# Patient Record
Sex: Female | Born: 1960
Health system: Southern US, Community
[De-identification: ages and names within clinical notes are randomized; demographics above are authoritative.]

## PROBLEM LIST (undated history)

## (undated) DIAGNOSIS — K529 Noninfective gastroenteritis and colitis, unspecified: Secondary | ICD-10-CM

## (undated) DIAGNOSIS — K219 Gastro-esophageal reflux disease without esophagitis: Secondary | ICD-10-CM

## (undated) DIAGNOSIS — C50919 Malignant neoplasm of unspecified site of unspecified female breast: Secondary | ICD-10-CM

## (undated) HISTORY — PX: WRIST SURGERY: SHX841

## (undated) HISTORY — PX: TUBAL LIGATION: SHX77

## (undated) HISTORY — PX: HERNIA REPAIR: SHX51

## (undated) HISTORY — PX: MASTECTOMY: SHX3

## (undated) HISTORY — DX: Noninfective gastroenteritis and colitis, unspecified: K52.9

## (undated) HISTORY — DX: Malignant neoplasm of unspecified site of unspecified female breast: C50.919

## (undated) HISTORY — PX: CHOLECYSTECTOMY: SHX55

## (undated) HISTORY — PX: COLON SURGERY: SHX602

## (undated) HISTORY — DX: Gastro-esophageal reflux disease without esophagitis: K21.9

## (undated) HISTORY — PX: TONSILLECTOMY: SUR1361

## (undated) HISTORY — PX: APPENDECTOMY: SHX54

## (undated) HISTORY — PX: OTHER SURGICAL HISTORY: SHX169

## (undated) MED FILL — Dexamethasone Sodium Phosphate Inj 100 MG/10ML: INTRAMUSCULAR | Qty: 1 | Status: AC

## (undated) MED FILL — Famotidine in NaCl 0.9% IV Soln 20 MG/50ML: INTRAVENOUS | Qty: 100 | Status: AC

## (undated) MED FILL — Fosaprepitant Dimeglumine For IV Infusion 150 MG (Base Eq): INTRAVENOUS | Qty: 5 | Status: AC

---

## 2015-07-29 DIAGNOSIS — C50911 Malignant neoplasm of unspecified site of right female breast: Secondary | ICD-10-CM | POA: Diagnosis not present

## 2015-07-29 DIAGNOSIS — C773 Secondary and unspecified malignant neoplasm of axilla and upper limb lymph nodes: Secondary | ICD-10-CM | POA: Diagnosis not present

## 2015-09-29 DIAGNOSIS — E669 Obesity, unspecified: Secondary | ICD-10-CM | POA: Insufficient documentation

## 2015-09-29 DIAGNOSIS — Z6830 Body mass index (BMI) 30.0-30.9, adult: Secondary | ICD-10-CM | POA: Insufficient documentation

## 2015-09-29 DIAGNOSIS — C773 Secondary and unspecified malignant neoplasm of axilla and upper limb lymph nodes: Secondary | ICD-10-CM | POA: Insufficient documentation

## 2015-09-29 DIAGNOSIS — C50111 Malignant neoplasm of central portion of right female breast: Secondary | ICD-10-CM | POA: Insufficient documentation

## 2015-12-02 DIAGNOSIS — C773 Secondary and unspecified malignant neoplasm of axilla and upper limb lymph nodes: Secondary | ICD-10-CM | POA: Diagnosis not present

## 2015-12-02 DIAGNOSIS — Z79811 Long term (current) use of aromatase inhibitors: Secondary | ICD-10-CM | POA: Diagnosis not present

## 2015-12-02 DIAGNOSIS — C50911 Malignant neoplasm of unspecified site of right female breast: Secondary | ICD-10-CM | POA: Diagnosis not present

## 2015-12-02 DIAGNOSIS — Z9011 Acquired absence of right breast and nipple: Secondary | ICD-10-CM | POA: Diagnosis not present

## 2016-04-02 DIAGNOSIS — C50111 Malignant neoplasm of central portion of right female breast: Secondary | ICD-10-CM | POA: Diagnosis not present

## 2016-04-02 DIAGNOSIS — Z170 Estrogen receptor positive status [ER+]: Secondary | ICD-10-CM | POA: Diagnosis not present

## 2016-04-02 DIAGNOSIS — Z79811 Long term (current) use of aromatase inhibitors: Secondary | ICD-10-CM | POA: Diagnosis not present

## 2016-04-20 DIAGNOSIS — Z452 Encounter for adjustment and management of vascular access device: Secondary | ICD-10-CM | POA: Insufficient documentation

## 2016-05-04 DIAGNOSIS — Z09 Encounter for follow-up examination after completed treatment for conditions other than malignant neoplasm: Secondary | ICD-10-CM | POA: Insufficient documentation

## 2016-08-03 DIAGNOSIS — Z79811 Long term (current) use of aromatase inhibitors: Secondary | ICD-10-CM | POA: Diagnosis not present

## 2016-08-03 DIAGNOSIS — Z170 Estrogen receptor positive status [ER+]: Secondary | ICD-10-CM | POA: Diagnosis not present

## 2016-08-03 DIAGNOSIS — C773 Secondary and unspecified malignant neoplasm of axilla and upper limb lymph nodes: Secondary | ICD-10-CM | POA: Diagnosis not present

## 2016-08-03 DIAGNOSIS — C50911 Malignant neoplasm of unspecified site of right female breast: Secondary | ICD-10-CM | POA: Diagnosis not present

## 2016-08-03 DIAGNOSIS — Z9011 Acquired absence of right breast and nipple: Secondary | ICD-10-CM | POA: Diagnosis not present

## 2016-08-03 DIAGNOSIS — Z853 Personal history of malignant neoplasm of breast: Secondary | ICD-10-CM | POA: Diagnosis not present

## 2016-09-09 DIAGNOSIS — R1013 Epigastric pain: Secondary | ICD-10-CM | POA: Diagnosis not present

## 2016-10-01 DIAGNOSIS — Z1231 Encounter for screening mammogram for malignant neoplasm of breast: Secondary | ICD-10-CM | POA: Diagnosis not present

## 2016-10-19 DIAGNOSIS — C773 Secondary and unspecified malignant neoplasm of axilla and upper limb lymph nodes: Secondary | ICD-10-CM | POA: Diagnosis not present

## 2016-10-19 DIAGNOSIS — Z170 Estrogen receptor positive status [ER+]: Secondary | ICD-10-CM | POA: Diagnosis not present

## 2016-10-19 DIAGNOSIS — C50111 Malignant neoplasm of central portion of right female breast: Secondary | ICD-10-CM | POA: Diagnosis not present

## 2016-11-04 DIAGNOSIS — C50919 Malignant neoplasm of unspecified site of unspecified female breast: Secondary | ICD-10-CM | POA: Diagnosis not present

## 2016-11-04 DIAGNOSIS — Z1211 Encounter for screening for malignant neoplasm of colon: Secondary | ICD-10-CM | POA: Diagnosis not present

## 2016-11-04 DIAGNOSIS — K621 Rectal polyp: Secondary | ICD-10-CM | POA: Diagnosis not present

## 2016-11-04 DIAGNOSIS — C50911 Malignant neoplasm of unspecified site of right female breast: Secondary | ICD-10-CM | POA: Diagnosis not present

## 2016-11-04 DIAGNOSIS — D126 Benign neoplasm of colon, unspecified: Secondary | ICD-10-CM | POA: Diagnosis not present

## 2016-11-04 DIAGNOSIS — C785 Secondary malignant neoplasm of large intestine and rectum: Secondary | ICD-10-CM | POA: Diagnosis not present

## 2016-11-12 DIAGNOSIS — C50911 Malignant neoplasm of unspecified site of right female breast: Secondary | ICD-10-CM | POA: Diagnosis not present

## 2016-11-12 DIAGNOSIS — C785 Secondary malignant neoplasm of large intestine and rectum: Secondary | ICD-10-CM | POA: Diagnosis not present

## 2016-11-12 DIAGNOSIS — R634 Abnormal weight loss: Secondary | ICD-10-CM | POA: Diagnosis not present

## 2016-11-12 DIAGNOSIS — C773 Secondary and unspecified malignant neoplasm of axilla and upper limb lymph nodes: Secondary | ICD-10-CM | POA: Diagnosis not present

## 2016-11-12 DIAGNOSIS — C50111 Malignant neoplasm of central portion of right female breast: Secondary | ICD-10-CM | POA: Diagnosis not present

## 2016-11-15 DIAGNOSIS — R109 Unspecified abdominal pain: Secondary | ICD-10-CM | POA: Diagnosis not present

## 2016-11-16 DIAGNOSIS — C50111 Malignant neoplasm of central portion of right female breast: Secondary | ICD-10-CM | POA: Diagnosis not present

## 2016-11-16 DIAGNOSIS — C785 Secondary malignant neoplasm of large intestine and rectum: Secondary | ICD-10-CM | POA: Diagnosis not present

## 2016-11-16 DIAGNOSIS — N1330 Unspecified hydronephrosis: Secondary | ICD-10-CM | POA: Diagnosis not present

## 2016-11-16 DIAGNOSIS — R188 Other ascites: Secondary | ICD-10-CM | POA: Diagnosis not present

## 2016-11-16 DIAGNOSIS — C7889 Secondary malignant neoplasm of other digestive organs: Secondary | ICD-10-CM | POA: Diagnosis not present

## 2016-11-16 DIAGNOSIS — C50919 Malignant neoplasm of unspecified site of unspecified female breast: Secondary | ICD-10-CM | POA: Diagnosis not present

## 2016-11-16 DIAGNOSIS — C784 Secondary malignant neoplasm of small intestine: Secondary | ICD-10-CM | POA: Diagnosis not present

## 2016-11-16 DIAGNOSIS — K5669 Other partial intestinal obstruction: Secondary | ICD-10-CM | POA: Diagnosis not present

## 2016-12-01 DIAGNOSIS — C50919 Malignant neoplasm of unspecified site of unspecified female breast: Secondary | ICD-10-CM | POA: Diagnosis not present

## 2016-12-01 DIAGNOSIS — C785 Secondary malignant neoplasm of large intestine and rectum: Secondary | ICD-10-CM | POA: Diagnosis not present

## 2016-12-01 DIAGNOSIS — Z5111 Encounter for antineoplastic chemotherapy: Secondary | ICD-10-CM | POA: Diagnosis not present

## 2016-12-01 DIAGNOSIS — C50111 Malignant neoplasm of central portion of right female breast: Secondary | ICD-10-CM | POA: Diagnosis not present

## 2016-12-05 DIAGNOSIS — K5669 Other partial intestinal obstruction: Secondary | ICD-10-CM | POA: Diagnosis not present

## 2016-12-05 DIAGNOSIS — K56609 Unspecified intestinal obstruction, unspecified as to partial versus complete obstruction: Secondary | ICD-10-CM | POA: Diagnosis not present

## 2016-12-05 DIAGNOSIS — R112 Nausea with vomiting, unspecified: Secondary | ICD-10-CM | POA: Diagnosis not present

## 2016-12-05 DIAGNOSIS — R188 Other ascites: Secondary | ICD-10-CM | POA: Diagnosis not present

## 2016-12-05 DIAGNOSIS — R1110 Vomiting, unspecified: Secondary | ICD-10-CM | POA: Diagnosis not present

## 2016-12-05 DIAGNOSIS — R109 Unspecified abdominal pain: Secondary | ICD-10-CM | POA: Diagnosis not present

## 2016-12-05 DIAGNOSIS — K56600 Partial intestinal obstruction, unspecified as to cause: Secondary | ICD-10-CM | POA: Diagnosis not present

## 2016-12-05 DIAGNOSIS — N390 Urinary tract infection, site not specified: Secondary | ICD-10-CM | POA: Diagnosis not present

## 2016-12-10 DIAGNOSIS — R188 Other ascites: Secondary | ICD-10-CM | POA: Diagnosis not present

## 2016-12-15 DIAGNOSIS — C50111 Malignant neoplasm of central portion of right female breast: Secondary | ICD-10-CM | POA: Diagnosis not present

## 2016-12-15 DIAGNOSIS — Z5111 Encounter for antineoplastic chemotherapy: Secondary | ICD-10-CM | POA: Diagnosis not present

## 2016-12-29 DIAGNOSIS — R109 Unspecified abdominal pain: Secondary | ICD-10-CM

## 2016-12-29 DIAGNOSIS — C50911 Malignant neoplasm of unspecified site of right female breast: Secondary | ICD-10-CM | POA: Diagnosis not present

## 2016-12-29 DIAGNOSIS — I951 Orthostatic hypotension: Secondary | ICD-10-CM | POA: Diagnosis not present

## 2016-12-29 DIAGNOSIS — C50919 Malignant neoplasm of unspecified site of unspecified female breast: Secondary | ICD-10-CM | POA: Diagnosis not present

## 2016-12-29 DIAGNOSIS — C785 Secondary malignant neoplasm of large intestine and rectum: Secondary | ICD-10-CM | POA: Diagnosis not present

## 2017-01-04 DIAGNOSIS — C773 Secondary and unspecified malignant neoplasm of axilla and upper limb lymph nodes: Secondary | ICD-10-CM | POA: Diagnosis not present

## 2017-01-04 DIAGNOSIS — C50111 Malignant neoplasm of central portion of right female breast: Secondary | ICD-10-CM | POA: Diagnosis not present

## 2017-01-04 DIAGNOSIS — Z515 Encounter for palliative care: Secondary | ICD-10-CM | POA: Diagnosis not present

## 2017-01-07 DIAGNOSIS — C50111 Malignant neoplasm of central portion of right female breast: Secondary | ICD-10-CM | POA: Diagnosis not present

## 2017-01-26 DIAGNOSIS — C50111 Malignant neoplasm of central portion of right female breast: Secondary | ICD-10-CM | POA: Diagnosis not present

## 2017-01-26 DIAGNOSIS — Z5111 Encounter for antineoplastic chemotherapy: Secondary | ICD-10-CM | POA: Diagnosis not present

## 2017-02-08 DIAGNOSIS — C50111 Malignant neoplasm of central portion of right female breast: Secondary | ICD-10-CM | POA: Diagnosis not present

## 2017-02-08 DIAGNOSIS — R188 Other ascites: Secondary | ICD-10-CM | POA: Diagnosis not present

## 2017-02-09 DIAGNOSIS — C773 Secondary and unspecified malignant neoplasm of axilla and upper limb lymph nodes: Secondary | ICD-10-CM | POA: Diagnosis not present

## 2017-02-09 DIAGNOSIS — R197 Diarrhea, unspecified: Secondary | ICD-10-CM | POA: Diagnosis not present

## 2017-02-09 DIAGNOSIS — C785 Secondary malignant neoplasm of large intestine and rectum: Secondary | ICD-10-CM | POA: Diagnosis not present

## 2017-02-09 DIAGNOSIS — C50919 Malignant neoplasm of unspecified site of unspecified female breast: Secondary | ICD-10-CM | POA: Diagnosis not present

## 2017-02-09 DIAGNOSIS — C50911 Malignant neoplasm of unspecified site of right female breast: Secondary | ICD-10-CM | POA: Diagnosis not present

## 2017-03-23 DIAGNOSIS — C785 Secondary malignant neoplasm of large intestine and rectum: Secondary | ICD-10-CM | POA: Diagnosis not present

## 2017-03-23 DIAGNOSIS — R197 Diarrhea, unspecified: Secondary | ICD-10-CM | POA: Diagnosis not present

## 2017-03-23 DIAGNOSIS — C50911 Malignant neoplasm of unspecified site of right female breast: Secondary | ICD-10-CM | POA: Diagnosis not present

## 2017-03-23 DIAGNOSIS — Z515 Encounter for palliative care: Secondary | ICD-10-CM | POA: Diagnosis not present

## 2017-03-23 DIAGNOSIS — C50919 Malignant neoplasm of unspecified site of unspecified female breast: Secondary | ICD-10-CM | POA: Diagnosis not present

## 2017-03-24 DIAGNOSIS — C785 Secondary malignant neoplasm of large intestine and rectum: Secondary | ICD-10-CM | POA: Diagnosis not present

## 2017-03-24 DIAGNOSIS — C50911 Malignant neoplasm of unspecified site of right female breast: Secondary | ICD-10-CM | POA: Diagnosis not present

## 2017-03-24 DIAGNOSIS — Z515 Encounter for palliative care: Secondary | ICD-10-CM | POA: Diagnosis not present

## 2017-03-25 DIAGNOSIS — C785 Secondary malignant neoplasm of large intestine and rectum: Secondary | ICD-10-CM | POA: Diagnosis not present

## 2017-03-25 DIAGNOSIS — C50911 Malignant neoplasm of unspecified site of right female breast: Secondary | ICD-10-CM | POA: Diagnosis not present

## 2017-04-25 DIAGNOSIS — C785 Secondary malignant neoplasm of large intestine and rectum: Secondary | ICD-10-CM

## 2017-04-25 DIAGNOSIS — Z515 Encounter for palliative care: Secondary | ICD-10-CM | POA: Diagnosis not present

## 2017-04-25 DIAGNOSIS — C50919 Malignant neoplasm of unspecified site of unspecified female breast: Secondary | ICD-10-CM

## 2017-04-25 DIAGNOSIS — C50911 Malignant neoplasm of unspecified site of right female breast: Secondary | ICD-10-CM | POA: Diagnosis not present

## 2017-05-25 DIAGNOSIS — C50111 Malignant neoplasm of central portion of right female breast: Secondary | ICD-10-CM | POA: Diagnosis not present

## 2017-05-25 DIAGNOSIS — R188 Other ascites: Secondary | ICD-10-CM | POA: Diagnosis not present

## 2017-05-25 DIAGNOSIS — I700 Atherosclerosis of aorta: Secondary | ICD-10-CM | POA: Diagnosis not present

## 2017-05-26 DIAGNOSIS — Z515 Encounter for palliative care: Secondary | ICD-10-CM | POA: Diagnosis not present

## 2017-05-26 DIAGNOSIS — C785 Secondary malignant neoplasm of large intestine and rectum: Secondary | ICD-10-CM | POA: Diagnosis not present

## 2017-05-26 DIAGNOSIS — C50919 Malignant neoplasm of unspecified site of unspecified female breast: Secondary | ICD-10-CM

## 2017-05-26 DIAGNOSIS — C50911 Malignant neoplasm of unspecified site of right female breast: Secondary | ICD-10-CM | POA: Diagnosis not present

## 2017-06-27 DIAGNOSIS — C785 Secondary malignant neoplasm of large intestine and rectum: Secondary | ICD-10-CM | POA: Diagnosis not present

## 2017-06-27 DIAGNOSIS — C50911 Malignant neoplasm of unspecified site of right female breast: Secondary | ICD-10-CM | POA: Diagnosis not present

## 2017-06-27 DIAGNOSIS — C50919 Malignant neoplasm of unspecified site of unspecified female breast: Secondary | ICD-10-CM | POA: Diagnosis not present

## 2017-06-27 DIAGNOSIS — K521 Toxic gastroenteritis and colitis: Secondary | ICD-10-CM | POA: Diagnosis not present

## 2017-07-28 DIAGNOSIS — C785 Secondary malignant neoplasm of large intestine and rectum: Secondary | ICD-10-CM | POA: Diagnosis not present

## 2017-07-28 DIAGNOSIS — M7989 Other specified soft tissue disorders: Secondary | ICD-10-CM | POA: Diagnosis not present

## 2017-07-28 DIAGNOSIS — C50919 Malignant neoplasm of unspecified site of unspecified female breast: Secondary | ICD-10-CM | POA: Diagnosis not present

## 2017-07-28 DIAGNOSIS — C50111 Malignant neoplasm of central portion of right female breast: Secondary | ICD-10-CM | POA: Diagnosis not present

## 2017-08-24 DIAGNOSIS — C50111 Malignant neoplasm of central portion of right female breast: Secondary | ICD-10-CM | POA: Diagnosis not present

## 2017-08-25 DIAGNOSIS — C50919 Malignant neoplasm of unspecified site of unspecified female breast: Secondary | ICD-10-CM | POA: Diagnosis not present

## 2017-08-25 DIAGNOSIS — C773 Secondary and unspecified malignant neoplasm of axilla and upper limb lymph nodes: Secondary | ICD-10-CM | POA: Diagnosis not present

## 2017-08-25 DIAGNOSIS — C50111 Malignant neoplasm of central portion of right female breast: Secondary | ICD-10-CM | POA: Diagnosis not present

## 2017-08-25 DIAGNOSIS — K521 Toxic gastroenteritis and colitis: Secondary | ICD-10-CM | POA: Diagnosis not present

## 2017-08-25 DIAGNOSIS — C785 Secondary malignant neoplasm of large intestine and rectum: Secondary | ICD-10-CM | POA: Diagnosis not present

## 2017-09-08 DIAGNOSIS — C50111 Malignant neoplasm of central portion of right female breast: Secondary | ICD-10-CM | POA: Diagnosis not present

## 2017-09-13 DIAGNOSIS — C50111 Malignant neoplasm of central portion of right female breast: Secondary | ICD-10-CM | POA: Diagnosis not present

## 2017-09-23 DIAGNOSIS — C50111 Malignant neoplasm of central portion of right female breast: Secondary | ICD-10-CM | POA: Diagnosis not present

## 2017-09-23 DIAGNOSIS — C773 Secondary and unspecified malignant neoplasm of axilla and upper limb lymph nodes: Secondary | ICD-10-CM | POA: Diagnosis not present

## 2017-09-23 DIAGNOSIS — C50919 Malignant neoplasm of unspecified site of unspecified female breast: Secondary | ICD-10-CM | POA: Diagnosis not present

## 2017-09-23 DIAGNOSIS — C785 Secondary malignant neoplasm of large intestine and rectum: Secondary | ICD-10-CM | POA: Diagnosis not present

## 2017-10-06 DIAGNOSIS — C50111 Malignant neoplasm of central portion of right female breast: Secondary | ICD-10-CM | POA: Diagnosis not present

## 2017-11-03 DIAGNOSIS — C50111 Malignant neoplasm of central portion of right female breast: Secondary | ICD-10-CM | POA: Diagnosis not present

## 2017-11-03 DIAGNOSIS — C773 Secondary and unspecified malignant neoplasm of axilla and upper limb lymph nodes: Secondary | ICD-10-CM | POA: Diagnosis not present

## 2017-11-03 DIAGNOSIS — C785 Secondary malignant neoplasm of large intestine and rectum: Secondary | ICD-10-CM | POA: Diagnosis not present

## 2017-11-03 DIAGNOSIS — C50919 Malignant neoplasm of unspecified site of unspecified female breast: Secondary | ICD-10-CM | POA: Diagnosis not present

## 2017-12-01 DIAGNOSIS — C773 Secondary and unspecified malignant neoplasm of axilla and upper limb lymph nodes: Secondary | ICD-10-CM | POA: Diagnosis not present

## 2017-12-01 DIAGNOSIS — C785 Secondary malignant neoplasm of large intestine and rectum: Secondary | ICD-10-CM

## 2017-12-01 DIAGNOSIS — C50111 Malignant neoplasm of central portion of right female breast: Secondary | ICD-10-CM | POA: Diagnosis not present

## 2017-12-01 DIAGNOSIS — C50919 Malignant neoplasm of unspecified site of unspecified female breast: Secondary | ICD-10-CM

## 2017-12-28 DIAGNOSIS — C50111 Malignant neoplasm of central portion of right female breast: Secondary | ICD-10-CM | POA: Diagnosis not present

## 2017-12-29 DIAGNOSIS — K521 Toxic gastroenteritis and colitis: Secondary | ICD-10-CM

## 2017-12-29 DIAGNOSIS — C50919 Malignant neoplasm of unspecified site of unspecified female breast: Secondary | ICD-10-CM

## 2017-12-29 DIAGNOSIS — C50111 Malignant neoplasm of central portion of right female breast: Secondary | ICD-10-CM | POA: Diagnosis not present

## 2017-12-29 DIAGNOSIS — C785 Secondary malignant neoplasm of large intestine and rectum: Secondary | ICD-10-CM

## 2018-01-26 DIAGNOSIS — C50919 Malignant neoplasm of unspecified site of unspecified female breast: Secondary | ICD-10-CM | POA: Diagnosis not present

## 2018-01-26 DIAGNOSIS — C785 Secondary malignant neoplasm of large intestine and rectum: Secondary | ICD-10-CM | POA: Diagnosis not present

## 2018-01-26 DIAGNOSIS — K521 Toxic gastroenteritis and colitis: Secondary | ICD-10-CM | POA: Diagnosis not present

## 2018-01-26 DIAGNOSIS — C50111 Malignant neoplasm of central portion of right female breast: Secondary | ICD-10-CM | POA: Diagnosis not present

## 2018-02-23 DIAGNOSIS — K521 Toxic gastroenteritis and colitis: Secondary | ICD-10-CM | POA: Diagnosis not present

## 2018-02-23 DIAGNOSIS — C773 Secondary and unspecified malignant neoplasm of axilla and upper limb lymph nodes: Secondary | ICD-10-CM | POA: Diagnosis not present

## 2018-02-23 DIAGNOSIS — C50111 Malignant neoplasm of central portion of right female breast: Secondary | ICD-10-CM | POA: Diagnosis not present

## 2018-02-23 DIAGNOSIS — C785 Secondary malignant neoplasm of large intestine and rectum: Secondary | ICD-10-CM | POA: Diagnosis not present

## 2018-02-23 DIAGNOSIS — C50919 Malignant neoplasm of unspecified site of unspecified female breast: Secondary | ICD-10-CM | POA: Diagnosis not present

## 2018-03-23 DIAGNOSIS — Z170 Estrogen receptor positive status [ER+]: Secondary | ICD-10-CM | POA: Diagnosis not present

## 2018-03-23 DIAGNOSIS — C773 Secondary and unspecified malignant neoplasm of axilla and upper limb lymph nodes: Secondary | ICD-10-CM | POA: Diagnosis not present

## 2018-03-23 DIAGNOSIS — C50919 Malignant neoplasm of unspecified site of unspecified female breast: Secondary | ICD-10-CM

## 2018-03-23 DIAGNOSIS — C50111 Malignant neoplasm of central portion of right female breast: Secondary | ICD-10-CM | POA: Diagnosis not present

## 2018-03-23 DIAGNOSIS — K521 Toxic gastroenteritis and colitis: Secondary | ICD-10-CM

## 2018-03-23 DIAGNOSIS — C785 Secondary malignant neoplasm of large intestine and rectum: Secondary | ICD-10-CM

## 2018-04-19 DIAGNOSIS — C50111 Malignant neoplasm of central portion of right female breast: Secondary | ICD-10-CM | POA: Diagnosis not present

## 2018-04-19 DIAGNOSIS — C189 Malignant neoplasm of colon, unspecified: Secondary | ICD-10-CM | POA: Diagnosis not present

## 2018-04-20 DIAGNOSIS — C50111 Malignant neoplasm of central portion of right female breast: Secondary | ICD-10-CM | POA: Diagnosis not present

## 2018-04-20 DIAGNOSIS — Z23 Encounter for immunization: Secondary | ICD-10-CM | POA: Diagnosis not present

## 2018-04-20 DIAGNOSIS — C785 Secondary malignant neoplasm of large intestine and rectum: Secondary | ICD-10-CM

## 2018-04-20 DIAGNOSIS — Z9221 Personal history of antineoplastic chemotherapy: Secondary | ICD-10-CM

## 2018-04-20 DIAGNOSIS — C50919 Malignant neoplasm of unspecified site of unspecified female breast: Secondary | ICD-10-CM | POA: Diagnosis not present

## 2018-04-20 DIAGNOSIS — R197 Diarrhea, unspecified: Secondary | ICD-10-CM

## 2018-04-20 DIAGNOSIS — Z170 Estrogen receptor positive status [ER+]: Secondary | ICD-10-CM | POA: Diagnosis not present

## 2018-04-21 DIAGNOSIS — C773 Secondary and unspecified malignant neoplasm of axilla and upper limb lymph nodes: Secondary | ICD-10-CM | POA: Diagnosis not present

## 2018-04-21 DIAGNOSIS — C785 Secondary malignant neoplasm of large intestine and rectum: Secondary | ICD-10-CM | POA: Diagnosis not present

## 2018-04-21 DIAGNOSIS — C50111 Malignant neoplasm of central portion of right female breast: Secondary | ICD-10-CM | POA: Diagnosis not present

## 2018-05-08 DIAGNOSIS — K591 Functional diarrhea: Secondary | ICD-10-CM | POA: Insufficient documentation

## 2018-05-08 DIAGNOSIS — C785 Secondary malignant neoplasm of large intestine and rectum: Secondary | ICD-10-CM | POA: Insufficient documentation

## 2018-05-22 DIAGNOSIS — R197 Diarrhea, unspecified: Secondary | ICD-10-CM

## 2018-05-22 DIAGNOSIS — C785 Secondary malignant neoplasm of large intestine and rectum: Secondary | ICD-10-CM

## 2018-05-22 DIAGNOSIS — C50919 Malignant neoplasm of unspecified site of unspecified female breast: Secondary | ICD-10-CM

## 2018-05-22 DIAGNOSIS — C50111 Malignant neoplasm of central portion of right female breast: Secondary | ICD-10-CM | POA: Diagnosis not present

## 2018-05-22 DIAGNOSIS — Z9221 Personal history of antineoplastic chemotherapy: Secondary | ICD-10-CM

## 2018-05-22 DIAGNOSIS — Z170 Estrogen receptor positive status [ER+]: Secondary | ICD-10-CM

## 2018-06-01 DIAGNOSIS — R197 Diarrhea, unspecified: Secondary | ICD-10-CM | POA: Diagnosis not present

## 2018-06-01 DIAGNOSIS — K591 Functional diarrhea: Secondary | ICD-10-CM | POA: Diagnosis not present

## 2018-06-01 DIAGNOSIS — Z853 Personal history of malignant neoplasm of breast: Secondary | ICD-10-CM | POA: Diagnosis not present

## 2018-06-01 DIAGNOSIS — Z85038 Personal history of other malignant neoplasm of large intestine: Secondary | ICD-10-CM | POA: Diagnosis not present

## 2018-06-01 DIAGNOSIS — C785 Secondary malignant neoplasm of large intestine and rectum: Secondary | ICD-10-CM | POA: Diagnosis not present

## 2018-06-22 DIAGNOSIS — C785 Secondary malignant neoplasm of large intestine and rectum: Secondary | ICD-10-CM | POA: Diagnosis not present

## 2018-06-22 DIAGNOSIS — C773 Secondary and unspecified malignant neoplasm of axilla and upper limb lymph nodes: Secondary | ICD-10-CM | POA: Diagnosis not present

## 2018-06-22 DIAGNOSIS — C50111 Malignant neoplasm of central portion of right female breast: Secondary | ICD-10-CM | POA: Diagnosis not present

## 2018-06-26 DIAGNOSIS — C785 Secondary malignant neoplasm of large intestine and rectum: Secondary | ICD-10-CM | POA: Diagnosis not present

## 2018-06-26 DIAGNOSIS — C773 Secondary and unspecified malignant neoplasm of axilla and upper limb lymph nodes: Secondary | ICD-10-CM | POA: Diagnosis not present

## 2018-06-26 DIAGNOSIS — C50111 Malignant neoplasm of central portion of right female breast: Secondary | ICD-10-CM | POA: Diagnosis not present

## 2018-07-21 DIAGNOSIS — Z5111 Encounter for antineoplastic chemotherapy: Secondary | ICD-10-CM | POA: Diagnosis not present

## 2018-07-21 DIAGNOSIS — Z170 Estrogen receptor positive status [ER+]: Secondary | ICD-10-CM | POA: Diagnosis not present

## 2018-07-21 DIAGNOSIS — C50919 Malignant neoplasm of unspecified site of unspecified female breast: Secondary | ICD-10-CM | POA: Diagnosis not present

## 2018-07-21 DIAGNOSIS — C785 Secondary malignant neoplasm of large intestine and rectum: Secondary | ICD-10-CM | POA: Diagnosis not present

## 2018-07-21 DIAGNOSIS — C50111 Malignant neoplasm of central portion of right female breast: Secondary | ICD-10-CM | POA: Diagnosis not present

## 2018-08-18 DIAGNOSIS — C785 Secondary malignant neoplasm of large intestine and rectum: Secondary | ICD-10-CM | POA: Diagnosis not present

## 2018-08-18 DIAGNOSIS — C773 Secondary and unspecified malignant neoplasm of axilla and upper limb lymph nodes: Secondary | ICD-10-CM | POA: Diagnosis not present

## 2018-08-18 DIAGNOSIS — Z170 Estrogen receptor positive status [ER+]: Secondary | ICD-10-CM | POA: Diagnosis not present

## 2018-08-18 DIAGNOSIS — C50919 Malignant neoplasm of unspecified site of unspecified female breast: Secondary | ICD-10-CM | POA: Diagnosis not present

## 2018-08-18 DIAGNOSIS — C50111 Malignant neoplasm of central portion of right female breast: Secondary | ICD-10-CM | POA: Diagnosis not present

## 2018-08-22 DIAGNOSIS — N1339 Other hydronephrosis: Secondary | ICD-10-CM | POA: Diagnosis not present

## 2018-08-22 DIAGNOSIS — Z853 Personal history of malignant neoplasm of breast: Secondary | ICD-10-CM | POA: Diagnosis not present

## 2018-08-22 DIAGNOSIS — C50111 Malignant neoplasm of central portion of right female breast: Secondary | ICD-10-CM | POA: Diagnosis not present

## 2018-09-15 DIAGNOSIS — C785 Secondary malignant neoplasm of large intestine and rectum: Secondary | ICD-10-CM | POA: Diagnosis not present

## 2018-09-15 DIAGNOSIS — C50919 Malignant neoplasm of unspecified site of unspecified female breast: Secondary | ICD-10-CM

## 2018-09-15 DIAGNOSIS — Z170 Estrogen receptor positive status [ER+]: Secondary | ICD-10-CM | POA: Diagnosis not present

## 2018-09-15 DIAGNOSIS — C50111 Malignant neoplasm of central portion of right female breast: Secondary | ICD-10-CM | POA: Diagnosis not present

## 2018-10-11 DIAGNOSIS — R112 Nausea with vomiting, unspecified: Secondary | ICD-10-CM | POA: Diagnosis not present

## 2018-10-11 DIAGNOSIS — Z853 Personal history of malignant neoplasm of breast: Secondary | ICD-10-CM | POA: Diagnosis not present

## 2018-10-11 DIAGNOSIS — E876 Hypokalemia: Secondary | ICD-10-CM | POA: Diagnosis not present

## 2018-10-11 DIAGNOSIS — K56609 Unspecified intestinal obstruction, unspecified as to partial versus complete obstruction: Secondary | ICD-10-CM | POA: Diagnosis not present

## 2018-10-11 DIAGNOSIS — K5651 Intestinal adhesions [bands], with partial obstruction: Secondary | ICD-10-CM | POA: Diagnosis not present

## 2018-10-16 DIAGNOSIS — Z5111 Encounter for antineoplastic chemotherapy: Secondary | ICD-10-CM | POA: Diagnosis not present

## 2018-10-16 DIAGNOSIS — C50111 Malignant neoplasm of central portion of right female breast: Secondary | ICD-10-CM | POA: Diagnosis not present

## 2018-10-23 DIAGNOSIS — E876 Hypokalemia: Secondary | ICD-10-CM | POA: Diagnosis not present

## 2018-10-23 DIAGNOSIS — Z6829 Body mass index (BMI) 29.0-29.9, adult: Secondary | ICD-10-CM | POA: Diagnosis not present

## 2018-10-23 DIAGNOSIS — K5669 Other partial intestinal obstruction: Secondary | ICD-10-CM | POA: Diagnosis not present

## 2018-10-23 DIAGNOSIS — E663 Overweight: Secondary | ICD-10-CM | POA: Diagnosis not present

## 2018-11-14 DIAGNOSIS — Z170 Estrogen receptor positive status [ER+]: Secondary | ICD-10-CM | POA: Diagnosis not present

## 2018-11-14 DIAGNOSIS — C50919 Malignant neoplasm of unspecified site of unspecified female breast: Secondary | ICD-10-CM

## 2018-11-14 DIAGNOSIS — C785 Secondary malignant neoplasm of large intestine and rectum: Secondary | ICD-10-CM | POA: Diagnosis not present

## 2019-01-12 DIAGNOSIS — C50111 Malignant neoplasm of central portion of right female breast: Secondary | ICD-10-CM | POA: Diagnosis not present

## 2019-03-16 DIAGNOSIS — C50111 Malignant neoplasm of central portion of right female breast: Secondary | ICD-10-CM | POA: Diagnosis not present

## 2019-05-11 DIAGNOSIS — Z23 Encounter for immunization: Secondary | ICD-10-CM | POA: Diagnosis not present

## 2019-05-11 DIAGNOSIS — C50111 Malignant neoplasm of central portion of right female breast: Secondary | ICD-10-CM | POA: Diagnosis not present

## 2019-05-11 DIAGNOSIS — Z5111 Encounter for antineoplastic chemotherapy: Secondary | ICD-10-CM | POA: Diagnosis not present

## 2019-06-08 DIAGNOSIS — C50111 Malignant neoplasm of central portion of right female breast: Secondary | ICD-10-CM | POA: Diagnosis not present

## 2019-06-08 DIAGNOSIS — Z85038 Personal history of other malignant neoplasm of large intestine: Secondary | ICD-10-CM | POA: Diagnosis not present

## 2019-06-08 DIAGNOSIS — Z853 Personal history of malignant neoplasm of breast: Secondary | ICD-10-CM | POA: Diagnosis not present

## 2019-07-06 DIAGNOSIS — C50111 Malignant neoplasm of central portion of right female breast: Secondary | ICD-10-CM | POA: Diagnosis not present

## 2019-07-06 DIAGNOSIS — Z5111 Encounter for antineoplastic chemotherapy: Secondary | ICD-10-CM | POA: Diagnosis not present

## 2019-07-11 DIAGNOSIS — R112 Nausea with vomiting, unspecified: Secondary | ICD-10-CM | POA: Diagnosis not present

## 2019-07-11 DIAGNOSIS — R1084 Generalized abdominal pain: Secondary | ICD-10-CM | POA: Diagnosis not present

## 2019-07-11 DIAGNOSIS — R1110 Vomiting, unspecified: Secondary | ICD-10-CM | POA: Diagnosis not present

## 2019-07-11 DIAGNOSIS — K565 Intestinal adhesions [bands], unspecified as to partial versus complete obstruction: Secondary | ICD-10-CM | POA: Diagnosis not present

## 2019-07-12 DIAGNOSIS — Z79891 Long term (current) use of opiate analgesic: Secondary | ICD-10-CM | POA: Diagnosis not present

## 2019-07-12 DIAGNOSIS — R197 Diarrhea, unspecified: Secondary | ICD-10-CM | POA: Diagnosis not present

## 2019-07-12 DIAGNOSIS — R1084 Generalized abdominal pain: Secondary | ICD-10-CM | POA: Diagnosis not present

## 2019-07-12 DIAGNOSIS — R112 Nausea with vomiting, unspecified: Secondary | ICD-10-CM

## 2019-07-12 DIAGNOSIS — Z79899 Other long term (current) drug therapy: Secondary | ICD-10-CM | POA: Diagnosis not present

## 2019-07-12 DIAGNOSIS — E860 Dehydration: Secondary | ICD-10-CM | POA: Diagnosis not present

## 2019-07-12 DIAGNOSIS — K565 Intestinal adhesions [bands], unspecified as to partial versus complete obstruction: Secondary | ICD-10-CM | POA: Diagnosis not present

## 2019-07-12 DIAGNOSIS — K56609 Unspecified intestinal obstruction, unspecified as to partial versus complete obstruction: Secondary | ICD-10-CM | POA: Diagnosis not present

## 2019-07-12 DIAGNOSIS — Z85038 Personal history of other malignant neoplasm of large intestine: Secondary | ICD-10-CM | POA: Diagnosis not present

## 2019-07-12 DIAGNOSIS — R1110 Vomiting, unspecified: Secondary | ICD-10-CM | POA: Diagnosis not present

## 2019-07-12 DIAGNOSIS — E785 Hyperlipidemia, unspecified: Secondary | ICD-10-CM | POA: Diagnosis not present

## 2019-07-12 DIAGNOSIS — R109 Unspecified abdominal pain: Secondary | ICD-10-CM

## 2019-07-12 DIAGNOSIS — Z87891 Personal history of nicotine dependence: Secondary | ICD-10-CM | POA: Diagnosis not present

## 2019-07-12 DIAGNOSIS — Z853 Personal history of malignant neoplasm of breast: Secondary | ICD-10-CM | POA: Diagnosis not present

## 2019-07-12 DIAGNOSIS — C50111 Malignant neoplasm of central portion of right female breast: Secondary | ICD-10-CM

## 2019-07-23 DIAGNOSIS — K5669 Other partial intestinal obstruction: Secondary | ICD-10-CM | POA: Diagnosis not present

## 2019-08-03 DIAGNOSIS — Z5111 Encounter for antineoplastic chemotherapy: Secondary | ICD-10-CM | POA: Diagnosis not present

## 2019-08-03 DIAGNOSIS — C50111 Malignant neoplasm of central portion of right female breast: Secondary | ICD-10-CM | POA: Diagnosis not present

## 2019-09-07 DIAGNOSIS — Z853 Personal history of malignant neoplasm of breast: Secondary | ICD-10-CM | POA: Diagnosis not present

## 2019-09-07 DIAGNOSIS — C50111 Malignant neoplasm of central portion of right female breast: Secondary | ICD-10-CM | POA: Diagnosis not present

## 2019-09-18 DIAGNOSIS — R112 Nausea with vomiting, unspecified: Secondary | ICD-10-CM | POA: Diagnosis not present

## 2019-09-18 DIAGNOSIS — R1084 Generalized abdominal pain: Secondary | ICD-10-CM | POA: Diagnosis not present

## 2019-10-01 ENCOUNTER — Encounter: Payer: Self-pay | Admitting: Internal Medicine

## 2019-10-05 DIAGNOSIS — Z5111 Encounter for antineoplastic chemotherapy: Secondary | ICD-10-CM | POA: Diagnosis not present

## 2019-10-05 DIAGNOSIS — Z853 Personal history of malignant neoplasm of breast: Secondary | ICD-10-CM | POA: Diagnosis not present

## 2019-10-07 DIAGNOSIS — Z9049 Acquired absence of other specified parts of digestive tract: Secondary | ICD-10-CM | POA: Diagnosis not present

## 2019-10-07 DIAGNOSIS — K529 Noninfective gastroenteritis and colitis, unspecified: Secondary | ICD-10-CM | POA: Diagnosis not present

## 2019-10-07 DIAGNOSIS — Z79899 Other long term (current) drug therapy: Secondary | ICD-10-CM | POA: Diagnosis not present

## 2019-10-07 DIAGNOSIS — R1110 Vomiting, unspecified: Secondary | ICD-10-CM | POA: Diagnosis not present

## 2019-10-07 DIAGNOSIS — G8929 Other chronic pain: Secondary | ICD-10-CM | POA: Diagnosis not present

## 2019-10-07 DIAGNOSIS — Z8719 Personal history of other diseases of the digestive system: Secondary | ICD-10-CM | POA: Diagnosis not present

## 2019-10-07 DIAGNOSIS — N390 Urinary tract infection, site not specified: Secondary | ICD-10-CM | POA: Diagnosis not present

## 2019-10-07 DIAGNOSIS — Z853 Personal history of malignant neoplasm of breast: Secondary | ICD-10-CM | POA: Diagnosis not present

## 2019-10-07 DIAGNOSIS — Z792 Long term (current) use of antibiotics: Secondary | ICD-10-CM | POA: Diagnosis not present

## 2019-10-07 DIAGNOSIS — Z87891 Personal history of nicotine dependence: Secondary | ICD-10-CM | POA: Diagnosis not present

## 2019-10-07 DIAGNOSIS — R112 Nausea with vomiting, unspecified: Secondary | ICD-10-CM | POA: Diagnosis not present

## 2019-10-07 DIAGNOSIS — E785 Hyperlipidemia, unspecified: Secondary | ICD-10-CM | POA: Diagnosis not present

## 2019-10-07 DIAGNOSIS — E860 Dehydration: Secondary | ICD-10-CM | POA: Diagnosis not present

## 2019-10-07 DIAGNOSIS — Z85038 Personal history of other malignant neoplasm of large intestine: Secondary | ICD-10-CM | POA: Diagnosis not present

## 2019-10-07 DIAGNOSIS — K591 Functional diarrhea: Secondary | ICD-10-CM | POA: Diagnosis not present

## 2019-10-07 DIAGNOSIS — B961 Klebsiella pneumoniae [K. pneumoniae] as the cause of diseases classified elsewhere: Secondary | ICD-10-CM | POA: Diagnosis not present

## 2019-10-07 DIAGNOSIS — R1084 Generalized abdominal pain: Secondary | ICD-10-CM | POA: Diagnosis not present

## 2019-10-07 DIAGNOSIS — R109 Unspecified abdominal pain: Secondary | ICD-10-CM | POA: Diagnosis not present

## 2019-10-08 DIAGNOSIS — K529 Noninfective gastroenteritis and colitis, unspecified: Secondary | ICD-10-CM | POA: Diagnosis not present

## 2019-10-08 DIAGNOSIS — E860 Dehydration: Secondary | ICD-10-CM | POA: Diagnosis not present

## 2019-10-08 DIAGNOSIS — R109 Unspecified abdominal pain: Secondary | ICD-10-CM | POA: Diagnosis not present

## 2019-10-09 DIAGNOSIS — E860 Dehydration: Secondary | ICD-10-CM | POA: Diagnosis not present

## 2019-10-09 DIAGNOSIS — R109 Unspecified abdominal pain: Secondary | ICD-10-CM | POA: Diagnosis not present

## 2019-10-09 DIAGNOSIS — K529 Noninfective gastroenteritis and colitis, unspecified: Secondary | ICD-10-CM | POA: Diagnosis not present

## 2019-10-10 DIAGNOSIS — E860 Dehydration: Secondary | ICD-10-CM | POA: Diagnosis not present

## 2019-10-10 DIAGNOSIS — K529 Noninfective gastroenteritis and colitis, unspecified: Secondary | ICD-10-CM | POA: Diagnosis not present

## 2019-10-10 DIAGNOSIS — R109 Unspecified abdominal pain: Secondary | ICD-10-CM | POA: Diagnosis not present

## 2019-10-11 DIAGNOSIS — K529 Noninfective gastroenteritis and colitis, unspecified: Secondary | ICD-10-CM | POA: Diagnosis not present

## 2019-10-11 DIAGNOSIS — R109 Unspecified abdominal pain: Secondary | ICD-10-CM | POA: Diagnosis not present

## 2019-10-11 DIAGNOSIS — E860 Dehydration: Secondary | ICD-10-CM | POA: Diagnosis not present

## 2019-10-22 DIAGNOSIS — N390 Urinary tract infection, site not specified: Secondary | ICD-10-CM | POA: Diagnosis not present

## 2019-10-22 DIAGNOSIS — R1084 Generalized abdominal pain: Secondary | ICD-10-CM | POA: Diagnosis not present

## 2019-10-30 ENCOUNTER — Other Ambulatory Visit: Payer: Self-pay

## 2019-10-30 ENCOUNTER — Ambulatory Visit: Payer: PPO | Admitting: Internal Medicine

## 2019-10-30 ENCOUNTER — Encounter: Payer: Self-pay | Admitting: Internal Medicine

## 2019-10-30 VITALS — BP 110/72 | HR 95 | Temp 98.5°F | Ht 67.0 in | Wt 169.0 lb

## 2019-10-30 DIAGNOSIS — R112 Nausea with vomiting, unspecified: Secondary | ICD-10-CM | POA: Diagnosis not present

## 2019-10-30 DIAGNOSIS — R109 Unspecified abdominal pain: Secondary | ICD-10-CM

## 2019-10-30 DIAGNOSIS — K529 Noninfective gastroenteritis and colitis, unspecified: Secondary | ICD-10-CM | POA: Diagnosis not present

## 2019-10-30 NOTE — Patient Instructions (Signed)
You have been scheduled for an endoscopy. Please follow written instructions given to you at your visit today. If you use inhalers (even only as needed), please bring them with you on the day of your procedure.  Continue to take your Omeprazole

## 2019-10-30 NOTE — Progress Notes (Signed)
HISTORY OF PRESENT ILLNESS:  Barbara Mayo is a pleasant 59 y.o. female with history of breast cancer 2016 status post mastectomy with subsequent recurrent metastatic disease to the colon 2018 causing obstruction and requiring partial colectomy.  Patient sent today by her primary care provider with complaints of early satiety with nausea and vomiting as well as chronic diarrhea.  She is accompanied by her husband.  Review of outside records from [place] shows that the patient was hospitalized January 2021 with small bowel obstruction.  She has had several due to adhesions.  She was also hospitalized in April 2021 with what was described as an acute enteritis as well as urinary tract infection.  Patient tells me that she has had problems with early satiety for about 2-1/2 months.  She is lost 10 pounds.  She vomits liquids at times.  She describes burning discomfort in the upper abdomen.  She was prescribed omeprazole but has not started this.  Denies NSAIDs.  For her chronic diarrhea she is on somatostatin.  Apparently this helped previously, but not currently.  She is on palbociclib/fulvestrant to prevent cancer recurrence.  She is status post cholecystectomy.  She had colonoscopy in 2018 and again December 2019 by her general surgeon Dr. Noberto Retort.  Based on that report, it appears that she has had right hemicolectomy.  No other abnormalities noted.  No biopsies obtained.  She is status post cholecystectomy.  Review of outside blood work showed hemoglobin 13.5.  Review of outside CT scan shows increased fluid in the luminal gut consistent with enteritis  REVIEW OF SYSTEMS:  All non-GI ROS negative unless otherwise stated in the HPI.  Past Medical History:  Diagnosis Date  . Breast cancer ([place])    spread to her colon  . Chronic diarrhea    per pt due to 5 ft of colon being removed    Past Surgical History:  Procedure Laterality Date  . APPENDECTOMY    . CHOLECYSTECTOMY    .  COLON SURGERY     removed 5 feet due to cancer that spread from breast  . HERNIA REPAIR    . lymph nodes     14 removed due to her breast cancer  . MASTECTOMY Right   . TONSILLECTOMY     as a child  . TUBAL LIGATION    . WRIST SURGERY      Social History Barbara Mayo  reports that she has quit smoking. Her smoking use included cigarettes. She quit after 15.00 years of use. She has never used smokeless tobacco. She reports that she does not drink alcohol or use drugs.  family history includes Breast cancer in her mother; Cystic fibrosis in her nephew; Diabetes in her grandson, grandson, mother, niece, and sister; Heart disease in her father; Osteoporosis in her mother.  No Known Allergies     PHYSICAL EXAMINATION: Vital signs: BP 110/72   Pulse 95   Temp 98.5 F (36.9 C)   Ht 5\' 7"  (1.702 m)   Wt 169 lb (76.7 kg)   BMI 26.47 kg/m   Constitutional: generally well-appearing, no acute distress Psychiatric: alert and oriented x3, cooperative Eyes: extraocular movements intact, anicteric, conjunctiva pink Mouth: oral pharynx moist, no lesions Neck: supple no lymphadenopathy Cardiovascular: heart regular rate and rhythm, no murmur Lungs: clear to auscultation bilaterally Abdomen: soft, nontender, nondistended, no obvious ascites, no peritoneal signs, normal bowel sounds, no organomegaly.  Prior surgical incisions well-healed Rectal: Omitted Extremities: no clubbing, cyanosis, or lower  extremity edema bilaterally Skin: no lesions on visible extremities Neuro: No focal deficits.  Cranial nerves intact  ASSESSMENT:  1.  2-1/26-month history of early satiety with intermittent nausea vomiting, weight loss and epigastric burning discomfort.  Rule out ulcer disease.  Rule out GERD 2.  Chronic diarrhea.  May be secondary topalbociclib/fulvestrant which cause diarrhea at approximate 25% of patients.  Other possibilities include bile salt related diarrhea, microscopic colitis, and  bacterial overgrowth.  Most recent colonoscopy November 2019.  No biopsies 3.  History of breast cancer 2016 status post mastectomy 4.  History of recurrent breast cancer to the colon with obstruction status post right hemicolectomy 5.  Status post cholecystectomy  PLAN:  1.  Begin omeprazole 40 mg daily. 2.  Schedule diagnostic upper endoscopy 3.  Review outside colonoscopy report.  Done 4.  Review extensive outside hospital records.  Done. 5.  Consider antidiarrheals, Colestid, broad-spectrum antibiotic.  If diarrhea persist despite these measures could consider colonoscopy with biopsies.  If that were negative, then may be her chemotherapeutic agents that are responsible for her diarrhea.  A total time of 60 minutes was spent preparing to see the patient, reviewing outside records, test, x-rays.  Obtaining and reviewing outside history and procedure reports as well as pathology.  Performing comprehensive physical examination and obtaining current history here in the office.  Counseling the patient and her husband regarding her above listed complex issues.  Ordering medications and advanced procedures.  Finally, documenting clinical information in the health record

## 2019-11-02 ENCOUNTER — Ambulatory Visit (AMBULATORY_SURGERY_CENTER): Payer: PPO | Admitting: Internal Medicine

## 2019-11-02 ENCOUNTER — Other Ambulatory Visit: Payer: Self-pay

## 2019-11-02 ENCOUNTER — Encounter: Payer: Self-pay | Admitting: Internal Medicine

## 2019-11-02 ENCOUNTER — Other Ambulatory Visit: Payer: Self-pay | Admitting: Internal Medicine

## 2019-11-02 VITALS — BP 132/65 | HR 63 | Temp 97.7°F | Resp 22 | Ht 67.0 in | Wt 169.0 lb

## 2019-11-02 DIAGNOSIS — C773 Secondary and unspecified malignant neoplasm of axilla and upper limb lymph nodes: Secondary | ICD-10-CM | POA: Diagnosis not present

## 2019-11-02 DIAGNOSIS — R109 Unspecified abdominal pain: Secondary | ICD-10-CM | POA: Diagnosis not present

## 2019-11-02 DIAGNOSIS — K208 Other esophagitis without bleeding: Secondary | ICD-10-CM | POA: Diagnosis not present

## 2019-11-02 DIAGNOSIS — C50111 Malignant neoplasm of central portion of right female breast: Secondary | ICD-10-CM | POA: Diagnosis not present

## 2019-11-02 DIAGNOSIS — R112 Nausea with vomiting, unspecified: Secondary | ICD-10-CM

## 2019-11-02 DIAGNOSIS — C785 Secondary malignant neoplasm of large intestine and rectum: Secondary | ICD-10-CM | POA: Diagnosis not present

## 2019-11-02 DIAGNOSIS — Z5111 Encounter for antineoplastic chemotherapy: Secondary | ICD-10-CM | POA: Diagnosis not present

## 2019-11-02 DIAGNOSIS — C169 Malignant neoplasm of stomach, unspecified: Secondary | ICD-10-CM | POA: Diagnosis not present

## 2019-11-02 DIAGNOSIS — K2950 Unspecified chronic gastritis without bleeding: Secondary | ICD-10-CM | POA: Diagnosis not present

## 2019-11-02 DIAGNOSIS — Z515 Encounter for palliative care: Secondary | ICD-10-CM | POA: Diagnosis not present

## 2019-11-02 MED ORDER — SODIUM CHLORIDE 0.9 % IV SOLN
4.0000 mg | Freq: Once | INTRAVENOUS | Status: AC
  Administered 2019-11-02: 4 mg via INTRAVENOUS

## 2019-11-02 MED ORDER — SODIUM CHLORIDE 0.9 % IV SOLN: 500.0000 mL | Freq: Once | INTRAVENOUS | Status: DC

## 2019-11-02 MED ORDER — COLESTIPOL HCL 1 G PO TABS: 2.0000 g | ORAL_TABLET | Freq: Two times a day (BID) | ORAL | 0 refills | Status: DC

## 2019-11-02 NOTE — Progress Notes (Signed)
A/ox3, pleased with MAC, report to RN 

## 2019-11-02 NOTE — Patient Instructions (Signed)
Continue recently prescribed Omeprazole 40mg  daily. Take every day.   Take Colestid (or colestipol) 2g by mouth twice daily. Hopefully this will help with your diarrhea.   YOU HAD AN ENDOSCOPIC PROCEDURE TODAY AT [facility]:   Refer to the procedure report that was given to you for any specific questions about what was found during the examination.  If the procedure report does not answer your questions, please call your gastroenterologist to clarify.  If you requested that your care partner not be given the details of your procedure findings, then the procedure report has been included in a sealed envelope for you to review at your convenience later.  YOU SHOULD EXPECT: Some feelings of bloating in the abdomen. Passage of more gas than usual.  Walking can help get rid of the air that was put into your GI tract during the procedure and reduce the bloating. If you had a lower endoscopy (such as a colonoscopy or flexible sigmoidoscopy) you may notice spotting of blood in your stool or on the toilet paper. If you underwent a bowel prep for your procedure, you may not have a normal bowel movement for a few days.  Please Note:  You might notice some irritation and congestion in your nose or some drainage.  This is from the oxygen used during your procedure.  There is no need for concern and it should clear up in a day or so.  SYMPTOMS TO REPORT IMMEDIATELY:    Following upper endoscopy (EGD)  Vomiting of blood or coffee ground material  New chest pain or pain under the shoulder blades  Painful or persistently difficult swallowing  New shortness of breath  Fever of 100F or higher  Black, tarry-looking stools  For urgent or emergent issues, a gastroenterologist can be reached at any hour by calling [phone]. Do not use MyChart messaging for urgent concerns.    DIET:  We do recommend a small meal at first, but then you may proceed to your regular diet.  Drink plenty of  fluids but you should avoid alcoholic beverages for 24 hours.  ACTIVITY:  You should plan to take it easy for the rest of today and you should NOT DRIVE or use heavy machinery until tomorrow (because of the sedation medicines used during the test).    FOLLOW UP: Our staff will call the number listed on your records 48-72 hours following your procedure to check on you and address any questions or concerns that you may have regarding the information given to you following your procedure. If we do not reach you, we will leave a message.  We will attempt to reach you two times.  During this call, we will ask if you have developed any symptoms of COVID 19. If you develop any symptoms (ie: fever, flu-like symptoms, shortness of breath, cough etc.) before then, please call [phone].  If you test positive for Covid 19 in the 2 weeks post procedure, please call and report this information to [place].    If any biopsies were taken you will be contacted by phone or by letter within the next 1-3 weeks.  Please call us at [phone] if you have not heard about the biopsies in 3 weeks.    SIGNATURES/CONFIDENTIALITY: You and/or your care partner have signed paperwork which will be entered into your electronic medical record.  These signatures attest to the fact that that the information above on your After Visit Summary has been reviewed and is understood.  Full responsibility of the confidentiality of this discharge information lies with you and/or your care-partner.

## 2019-11-02 NOTE — Progress Notes (Signed)
Pt nauseated in recovery. Zofran given via IV per protocol. Patient reports relief of nausea prior to d/c home.

## 2019-11-02 NOTE — Progress Notes (Signed)
Called to room to assist during endoscopic procedure.  Patient ID and intended procedure confirmed with present staff. Received instructions for my participation in the procedure from the performing physician.  

## 2019-11-02 NOTE — Op Note (Addendum)
[place] Patient Name: Barbara Mayo Procedure Date: 11/02/2019 8:54 AM MRN: [id number] Endoscopist: Docia Chuck. Henrene Pastor , MD Age: 59 Referring MD:  Date of Birth: [birth year] Gender: Female Account #: [id number] Procedure:                Upper GI endoscopy with biopsies Indications:              Abdominal pain, Nausea with vomiting, Weight loss Medicines:                Monitored Anesthesia Care Procedure:                Pre-Anesthesia Assessment:                           - Prior to the procedure, a History and Physical                            was performed, and patient medications and                            allergies were reviewed. The patient's tolerance of                            previous anesthesia was also reviewed. The risks                            and benefits of the procedure and the sedation                            options and risks were discussed with the patient.                            All questions were answered, and informed consent                            was obtained. Prior Anticoagulants: The patient has                            taken no previous anticoagulant or antiplatelet                            agents. ASA Grade Assessment: II - A patient with                            mild systemic disease. After reviewing the risks                            and benefits, the patient was deemed in                            satisfactory condition to undergo the procedure.                           After obtaining informed consent, the endoscope was  passed under direct vision. Throughout the                            procedure, the patient's blood pressure, pulse, and                            oxygen saturations were monitored continuously. The                            Endoscope was introduced through the mouth, and                            advanced to the second part of duodenum. The upper        GI endoscopy was accomplished without difficulty.                            The patient tolerated the procedure well. Scope In: Scope Out: Findings:                 The esophagus had 2 tongues of salmon type mucosa                            distally. Each measured about 1.5 cm and may                            represent Barrett's. Biopsies were taken with a                            cold forceps for histology.                           The stomach revealed grossly abnormal mucosa                            involving the body and proximal antrum. This was                            manifested by hyperemia, friability, edema, and                            superficial serpiginous ulceration. Biopsies were                            taken with a cold forceps for histology.                           The examined duodenum was normal.                           The cardia and gastric fundus were normal on                            retroflexion. Complications:            No immediate complications. Estimated Blood Loss:  Estimated blood loss: none. Impression:               1. GERD with possible short segment Barrett's                            esophagus                           2. Abnormal gastric mucosa status post biopsies. Recommendation:           1. Continue recently prescribed omeprazole 40 mg                            daily. Take every day                           2. Resume previous diet and other medications                           3. Prescribe Colestid (or colestipol) 1 g; #120;                            take 2 g by mouth twice daily. Hopefully this will                            help your diarrhea some                           4. Office follow-up with Dr. Henrene Pastor in 4 weeks                           5. Follow-up biopsies. We will contact you with the                            results. Docia Chuck. Henrene Pastor, MD 11/02/2019 9:33:20 AM This report has been signed  electronically.

## 2019-11-02 NOTE — Progress Notes (Signed)
Vitals-NS  Pt's states no medical or surgical changes since previsit or office visit. 

## 2019-11-06 ENCOUNTER — Telehealth: Payer: Self-pay | Admitting: *Deleted

## 2019-11-06 ENCOUNTER — Telehealth: Payer: Self-pay

## 2019-11-06 NOTE — Telephone Encounter (Signed)
NO ANSWER, MESSAGE LEFT FOR PATIENT. 

## 2019-11-06 NOTE — Telephone Encounter (Signed)
  Follow up Call-  Call back number 11/02/2019  Post procedure Call Back phone  # [phone]  Permission to leave phone message Yes  Some recent data might be hidden     Patient questions:  Do you have a fever, pain , or abdominal swelling? No. Pain Score  0 *  Have you tolerated food without any problems? Yes.    Have you been able to return to your normal activities? Yes.    Do you have any questions about your discharge instructions: Diet   No. Medications  No. Follow up visit  No.  Do you have questions or concerns about your Care? No.  Actions: * If pain score is 4 or above: No action needed, pain <4.  1. Have you developed a fever since your procedure? no  2.   Have you had an respiratory symptoms (SOB or cough) since your procedure? no  3.   Have you tested positive for COVID 19 since your procedure no  4.   Have you had any family members/close contacts diagnosed with the COVID 19 since your procedure?  no   If yes to any of these questions please route to Joylene John, RN and Erenest Rasher, RN

## 2019-11-08 ENCOUNTER — Telehealth: Payer: Self-pay

## 2019-11-08 NOTE — Telephone Encounter (Signed)
-----   Message from Irene Shipper, MD sent at 11/08/2019 12:09 PM EDT ----- Regarding: FW: Call her oncologist  ----- Message ----- From: Irene Shipper, MD Sent: 11/08/2019  12:05 PM EDT To: Irene Shipper, MD Subject: RE: Call her oncologist                        Vaughan Basta,  Call Katrina at Dr Bobby Rumpf office (I spoke with her just now). They need my office note, Endo report, and pathology reort. They will then touch base with the patient for her follow up. I spoke to patient last night regarding cancer on her gastric biopsies.  Thanks,  JP ----- Message ----- From: Irene Shipper, MD Sent: 11/07/2019   5:51 PM EDT To: Irene Shipper, MD Subject: Call her oncologist                            Dr. Lavera Guise.  [phone]

## 2019-11-08 NOTE — Telephone Encounter (Signed)
Records faxed to Katrina [phone].

## 2019-11-09 DIAGNOSIS — S42202A Unspecified fracture of upper end of left humerus, initial encounter for closed fracture: Secondary | ICD-10-CM | POA: Diagnosis not present

## 2019-11-12 DIAGNOSIS — S42255A Nondisplaced fracture of greater tuberosity of left humerus, initial encounter for closed fracture: Secondary | ICD-10-CM | POA: Diagnosis not present

## 2019-11-13 DIAGNOSIS — S42255A Nondisplaced fracture of greater tuberosity of left humerus, initial encounter for closed fracture: Secondary | ICD-10-CM | POA: Diagnosis not present

## 2019-11-15 DIAGNOSIS — C50111 Malignant neoplasm of central portion of right female breast: Secondary | ICD-10-CM | POA: Diagnosis not present

## 2019-11-15 DIAGNOSIS — Z9049 Acquired absence of other specified parts of digestive tract: Secondary | ICD-10-CM | POA: Diagnosis not present

## 2019-11-15 DIAGNOSIS — C50919 Malignant neoplasm of unspecified site of unspecified female breast: Secondary | ICD-10-CM | POA: Diagnosis not present

## 2019-11-15 DIAGNOSIS — R918 Other nonspecific abnormal finding of lung field: Secondary | ICD-10-CM | POA: Diagnosis not present

## 2019-11-15 DIAGNOSIS — C7951 Secondary malignant neoplasm of bone: Secondary | ICD-10-CM | POA: Diagnosis not present

## 2019-11-16 DIAGNOSIS — C785 Secondary malignant neoplasm of large intestine and rectum: Secondary | ICD-10-CM | POA: Diagnosis not present

## 2019-11-16 DIAGNOSIS — C773 Secondary and unspecified malignant neoplasm of axilla and upper limb lymph nodes: Secondary | ICD-10-CM | POA: Diagnosis not present

## 2019-11-16 DIAGNOSIS — C50111 Malignant neoplasm of central portion of right female breast: Secondary | ICD-10-CM | POA: Diagnosis not present

## 2019-11-20 ENCOUNTER — Other Ambulatory Visit (HOSPITAL_COMMUNITY): Payer: Self-pay | Admitting: Oncology

## 2019-11-20 DIAGNOSIS — C50111 Malignant neoplasm of central portion of right female breast: Secondary | ICD-10-CM

## 2019-11-22 DIAGNOSIS — S42255A Nondisplaced fracture of greater tuberosity of left humerus, initial encounter for closed fracture: Secondary | ICD-10-CM | POA: Diagnosis not present

## 2019-11-30 ENCOUNTER — Ambulatory Visit (HOSPITAL_COMMUNITY)
Admission: RE | Admit: 2019-11-30 | Discharge: 2019-11-30 | Disposition: A | Discharge status: Home / Self Care | Payer: PPO | Source: Ambulatory Visit | Attending: Oncology | Admitting: Oncology

## 2019-11-30 ENCOUNTER — Other Ambulatory Visit: Payer: Self-pay

## 2019-11-30 DIAGNOSIS — C50111 Malignant neoplasm of central portion of right female breast: Secondary | ICD-10-CM | POA: Insufficient documentation

## 2019-11-30 DIAGNOSIS — Z5111 Encounter for antineoplastic chemotherapy: Secondary | ICD-10-CM | POA: Diagnosis not present

## 2019-11-30 DIAGNOSIS — C50919 Malignant neoplasm of unspecified site of unspecified female breast: Secondary | ICD-10-CM | POA: Diagnosis not present

## 2019-11-30 MED ORDER — FLUOROESTRADIOL F 18 4-100 MCI/ML IV SOLN
6.0000 | Freq: Once | INTRAVENOUS | Status: AC
  Administered 2019-11-30: 5.09 via INTRAVENOUS

## 2019-12-02 ENCOUNTER — Other Ambulatory Visit: Payer: Self-pay | Admitting: Internal Medicine

## 2019-12-02 DIAGNOSIS — R112 Nausea with vomiting, unspecified: Secondary | ICD-10-CM

## 2019-12-02 DIAGNOSIS — R109 Unspecified abdominal pain: Secondary | ICD-10-CM

## 2019-12-03 ENCOUNTER — Other Ambulatory Visit: Payer: Self-pay | Admitting: Internal Medicine

## 2019-12-03 DIAGNOSIS — Z515 Encounter for palliative care: Secondary | ICD-10-CM | POA: Diagnosis not present

## 2019-12-03 DIAGNOSIS — C50111 Malignant neoplasm of central portion of right female breast: Secondary | ICD-10-CM | POA: Diagnosis not present

## 2019-12-03 DIAGNOSIS — C773 Secondary and unspecified malignant neoplasm of axilla and upper limb lymph nodes: Secondary | ICD-10-CM | POA: Diagnosis not present

## 2019-12-03 DIAGNOSIS — R112 Nausea with vomiting, unspecified: Secondary | ICD-10-CM

## 2019-12-03 DIAGNOSIS — C785 Secondary malignant neoplasm of large intestine and rectum: Secondary | ICD-10-CM | POA: Diagnosis not present

## 2019-12-03 DIAGNOSIS — R109 Unspecified abdominal pain: Secondary | ICD-10-CM

## 2019-12-03 DIAGNOSIS — R197 Diarrhea, unspecified: Secondary | ICD-10-CM | POA: Diagnosis not present

## 2019-12-06 DIAGNOSIS — S42255A Nondisplaced fracture of greater tuberosity of left humerus, initial encounter for closed fracture: Secondary | ICD-10-CM | POA: Diagnosis not present

## 2019-12-14 ENCOUNTER — Ambulatory Visit: Payer: PPO | Admitting: Internal Medicine

## 2019-12-17 ENCOUNTER — Telehealth: Payer: Self-pay | Admitting: Internal Medicine

## 2019-12-17 DIAGNOSIS — C7889 Secondary malignant neoplasm of other digestive organs: Secondary | ICD-10-CM | POA: Diagnosis not present

## 2019-12-17 DIAGNOSIS — C50111 Malignant neoplasm of central portion of right female breast: Secondary | ICD-10-CM | POA: Diagnosis not present

## 2019-12-17 NOTE — Telephone Encounter (Signed)
Left message for Barbara Mayo to call back.  

## 2019-12-17 NOTE — Telephone Encounter (Signed)
Lenna Sciara, NP at the [facility]. Called requesting a call back from Dr. Blanch Media nurse. She would like to speak with you about pt's health status before pt sees Dr. Henrene Pastor on 7/1. Pls call Melissa or Dr. Bobby Rumpf.

## 2019-12-18 DIAGNOSIS — M25612 Stiffness of left shoulder, not elsewhere classified: Secondary | ICD-10-CM | POA: Diagnosis not present

## 2019-12-18 DIAGNOSIS — M6281 Muscle weakness (generalized): Secondary | ICD-10-CM | POA: Diagnosis not present

## 2019-12-18 DIAGNOSIS — M25512 Pain in left shoulder: Secondary | ICD-10-CM | POA: Diagnosis not present

## 2019-12-18 NOTE — Telephone Encounter (Signed)
Barbara Mayo from [facility] states Dr. Bobby Rumpf did several pet scans on this pt and there is no evidence of any other disease in this pt. Dr. Bobby Rumpf would like to know how Dr. Henrene Pastor would like to monitor Barbara Mayo cancer, if he plans on doing an EGD periodically. Pt is going to start oral treatment with Everolimus and Exemestane. Pt is scheduled to see Dr. Henrene Pastor Thursday 12/20/19.

## 2019-12-18 NOTE — Telephone Encounter (Signed)
Melissa for [facility] called returning you call

## 2019-12-18 NOTE — Telephone Encounter (Signed)
I tried to call Dr. Bobby Rumpf several times in the last 2 weeks, but it goes to voicemail -- which is full. Anyway, I'll repeat EGD with biopsies at this time and if cancer again present, it will be up to oncology to formulate care plan. I am happy to repeat EGD as requested, if it helps to direct care. Please let them know. Thanks

## 2019-12-19 NOTE — Telephone Encounter (Signed)
Spoke with Barbara Mayo and she will let Dr. Bobby Rumpf know.

## 2019-12-20 ENCOUNTER — Encounter: Payer: Self-pay | Admitting: Internal Medicine

## 2019-12-20 ENCOUNTER — Ambulatory Visit: Payer: PPO | Admitting: Internal Medicine

## 2019-12-20 VITALS — BP 114/76 | HR 87 | Ht 67.0 in | Wt 159.8 lb

## 2019-12-20 DIAGNOSIS — R112 Nausea with vomiting, unspecified: Secondary | ICD-10-CM

## 2019-12-20 DIAGNOSIS — K529 Noninfective gastroenteritis and colitis, unspecified: Secondary | ICD-10-CM | POA: Diagnosis not present

## 2019-12-20 DIAGNOSIS — C169 Malignant neoplasm of stomach, unspecified: Secondary | ICD-10-CM

## 2019-12-20 DIAGNOSIS — R109 Unspecified abdominal pain: Secondary | ICD-10-CM | POA: Diagnosis not present

## 2019-12-20 MED ORDER — COLESTIPOL HCL 1 G PO TABS: ORAL_TABLET | ORAL | 3 refills | Status: DC

## 2019-12-20 NOTE — Progress Notes (Signed)
HISTORY OF PRESENT ILLNESS:  Barbara Mayo is a pleasant 59 y.o. female with a history of breast cancer 2016 status post mastectomy with subsequent recurrent metastatic disease to the colon 2018.  This required partial colectomy due to obstruction.  Patient in the office on 1 occasion as a new patient Oct 30, 2019 regarding 2-1/60-monthhistory of early satiety with intermittent nausea vomiting as well as weight loss and epigastric burning discomfort.  In addition, chronic diarrhea.  She was initiated on omeprazole 40 mg daily.  She subsequently underwent upper endoscopy Nov 02, 2019.  She was found to have grossly abnormal gastric mucosa without mass.  Biopsies revealed adenocarcinoma with signet features.  No evidence for Helicobacter pylori.  Sent her back to her oncologist, Dr. LBobby Rumpf  Patient had this follow-up appointment.  She is accompanied by her husband.  We did place her on Colestid 2 g twice daily for her diarrhea.  Her diarrhea has not resolved but it has improved.  She request medication refill.  She continues with problems with vomiting about every other day.  Less burning on PPI.  She has been on palbociclib/fulvestrant for history of metastatic breast cancer.  She underwent advanced imaging including PET/CT imaging recently.  There is no evidence of active disease or abnormal PET activity.  Her chemotherapeutic agent is being discontinued and she is anticipating starting on everolimus and exemestane.  Told that Dr. LBobby Rumpfwould like surveillance EGD in about 4 months to assess response to therapies.  Patient's husband is angry and frustrated throughout the course of the interview.  He continually asked what was going to do to help this problem.  He throws a myriad of suggestions, which unfortunately are not helpful.  His insight into her problem is somewhat limited.  Since her last office evaluation she has lost an additional 10 pounds  REVIEW OF SYSTEMS:  All non-GI ROS negative unless  otherwise stated in the HPI except for bleeding problems, ankle edema, muscle cramps, fatigue  Past Medical History:  Diagnosis Date  . [place]    spread to her colon  . Chronic diarrhea    per pt due to 5 ft of colon being removed  . GERD (gastroesophageal reflux disease)     Past Surgical History:  Procedure Laterality Date  . APPENDECTOMY    . CHOLECYSTECTOMY    . COLON SURGERY     removed 5 feet due to cancer that spread from breast  . lymph nodes     14 removed due to her breast cancer  . MASTECTOMY Right   . TONSILLECTOMY     as a child  . TUBAL LIGATION    . WRIST SURGERY      Social History LCarl Bleecker reports that she has quit smoking. Her smoking use included cigarettes. She quit after 15.00 years of use. She has never used smokeless tobacco. She reports that she does not drink alcohol and does not use drugs.  family history includes Breast cancer in her mother; Cystic fibrosis in her nephew; Diabetes in her grandson, grandson, mother, niece, and sister; Heart disease in her father; Osteoporosis in her mother.  No Known Allergies     PHYSICAL EXAMINATION: Vital signs: BP 114/76   Pulse 87   Ht 5' 7"  (1.702 m)   Wt 159 lb 12.8 oz (72.5 kg)   SpO2 99%   BMI 25.03 kg/m   Constitutional: generally well-appearing, no acute distress Psychiatric: alert and oriented x3, cooperative  Eyes: extraocular movements intact, anicteric, conjunctiva pink Mouth: oral pharynx moist, no lesions Neck: supple no lymphadenopathy Cardiovascular: heart regular rate and rhythm, no murmur Lungs: clear to auscultation bilaterally Abdomen: soft, nontender, nondistended, no obvious ascites, no peritoneal signs, normal bowel sounds, no organomegaly.  Prior surgical incision well-healed Rectal: Omitted Extremities: no clubbing, cyanosis, or significant lower extremity edema bilaterally Skin: no lesions on visible extremities Neuro: No focal deficits.  Cranial nerves  intact  ASSESSMENT:  1.  Adenocarcinoma of the stomach.  Infiltrative. 2.  History of metastatic breast cancer.  Being followed by Dr. Bobby Rumpf 3.  Intermittent nausea, vomiting, and weight loss secondary to #1 above 4.  Chronic diarrhea.  Ongoing.  Improved with Colestid.  Also on octreotide.   PLAN:  1.  Continue PPI 2.  Continue Colestid.  Prescription refilled 3.  Ongoing treatment for cancer per Dr. Bobby Rumpf 4.  EGD in 4 months as requested by oncology.  Recall has been made 5.  Detailed discussion with the patient and her husband regarding her condition and limitations from a GI standpoint. A total time of 30 minutes was spent preparing to see the patient, reviewing outside tests and x-rays, reviewing pathology, obtaining interval history, performing medically appropriate exam, counseling the patient and her husband regarding her condition, ordering medications follow-up endoscopic surveillance procedure, and documenting clinical information in the health record

## 2019-12-20 NOTE — Patient Instructions (Signed)
I will contact you in a few months to schedule an EGD for November.

## 2019-12-25 DIAGNOSIS — M25612 Stiffness of left shoulder, not elsewhere classified: Secondary | ICD-10-CM | POA: Diagnosis not present

## 2019-12-25 DIAGNOSIS — M6281 Muscle weakness (generalized): Secondary | ICD-10-CM | POA: Diagnosis not present

## 2019-12-25 DIAGNOSIS — M25512 Pain in left shoulder: Secondary | ICD-10-CM | POA: Diagnosis not present

## 2019-12-28 DIAGNOSIS — M6281 Muscle weakness (generalized): Secondary | ICD-10-CM | POA: Diagnosis not present

## 2019-12-28 DIAGNOSIS — M25612 Stiffness of left shoulder, not elsewhere classified: Secondary | ICD-10-CM | POA: Diagnosis not present

## 2019-12-28 DIAGNOSIS — Z5111 Encounter for antineoplastic chemotherapy: Secondary | ICD-10-CM | POA: Diagnosis not present

## 2019-12-28 DIAGNOSIS — C50111 Malignant neoplasm of central portion of right female breast: Secondary | ICD-10-CM | POA: Diagnosis not present

## 2019-12-28 DIAGNOSIS — M25512 Pain in left shoulder: Secondary | ICD-10-CM | POA: Diagnosis not present

## 2020-01-09 DIAGNOSIS — S42255A Nondisplaced fracture of greater tuberosity of left humerus, initial encounter for closed fracture: Secondary | ICD-10-CM | POA: Diagnosis not present

## 2020-01-25 DIAGNOSIS — Z515 Encounter for palliative care: Secondary | ICD-10-CM | POA: Diagnosis not present

## 2020-01-25 DIAGNOSIS — C773 Secondary and unspecified malignant neoplasm of axilla and upper limb lymph nodes: Secondary | ICD-10-CM | POA: Diagnosis not present

## 2020-01-25 DIAGNOSIS — C50111 Malignant neoplasm of central portion of right female breast: Secondary | ICD-10-CM | POA: Diagnosis not present

## 2020-01-25 DIAGNOSIS — C785 Secondary malignant neoplasm of large intestine and rectum: Secondary | ICD-10-CM | POA: Diagnosis not present

## 2020-01-28 DIAGNOSIS — C50111 Malignant neoplasm of central portion of right female breast: Secondary | ICD-10-CM | POA: Diagnosis not present

## 2020-02-05 DIAGNOSIS — C50111 Malignant neoplasm of central portion of right female breast: Secondary | ICD-10-CM | POA: Diagnosis not present

## 2020-02-05 DIAGNOSIS — Z515 Encounter for palliative care: Secondary | ICD-10-CM | POA: Diagnosis not present

## 2020-02-05 DIAGNOSIS — R197 Diarrhea, unspecified: Secondary | ICD-10-CM | POA: Diagnosis not present

## 2020-02-05 DIAGNOSIS — C773 Secondary and unspecified malignant neoplasm of axilla and upper limb lymph nodes: Secondary | ICD-10-CM | POA: Diagnosis not present

## 2020-02-05 DIAGNOSIS — C785 Secondary malignant neoplasm of large intestine and rectum: Secondary | ICD-10-CM | POA: Diagnosis not present

## 2020-02-11 DIAGNOSIS — S42255A Nondisplaced fracture of greater tuberosity of left humerus, initial encounter for closed fracture: Secondary | ICD-10-CM | POA: Diagnosis not present

## 2020-03-07 DIAGNOSIS — C7889 Secondary malignant neoplasm of other digestive organs: Secondary | ICD-10-CM | POA: Diagnosis not present

## 2020-03-07 DIAGNOSIS — C50111 Malignant neoplasm of central portion of right female breast: Secondary | ICD-10-CM | POA: Diagnosis not present

## 2020-03-28 ENCOUNTER — Encounter: Payer: Self-pay | Admitting: Pharmacist

## 2020-03-28 DIAGNOSIS — R197 Diarrhea, unspecified: Secondary | ICD-10-CM

## 2020-03-28 NOTE — Progress Notes (Signed)
The following Medication: Sandostatin has been approved thru [employer]. Enrollment period is 06/22/2019 to 06/20/2020. Assistance ID: [id number], medication is ordered prior to appointment to be on hand for treatment.  First DOS: 07/06/2019

## 2020-04-03 ENCOUNTER — Other Ambulatory Visit: Payer: Self-pay | Admitting: Hematology and Oncology

## 2020-04-03 DIAGNOSIS — Z23 Encounter for immunization: Secondary | ICD-10-CM

## 2020-04-04 ENCOUNTER — Other Ambulatory Visit: Payer: Self-pay

## 2020-04-04 ENCOUNTER — Inpatient Hospital Stay: Payer: PPO | Attending: Oncology

## 2020-04-04 VITALS — BP 116/81 | HR 97 | Temp 98.0°F | Resp 18 | Wt 138.5 lb

## 2020-04-04 DIAGNOSIS — C7889 Secondary malignant neoplasm of other digestive organs: Secondary | ICD-10-CM | POA: Diagnosis not present

## 2020-04-04 DIAGNOSIS — R197 Diarrhea, unspecified: Secondary | ICD-10-CM | POA: Diagnosis not present

## 2020-04-04 DIAGNOSIS — C50919 Malignant neoplasm of unspecified site of unspecified female breast: Secondary | ICD-10-CM | POA: Diagnosis not present

## 2020-04-04 DIAGNOSIS — Z23 Encounter for immunization: Secondary | ICD-10-CM

## 2020-04-04 DIAGNOSIS — Z170 Estrogen receptor positive status [ER+]: Secondary | ICD-10-CM | POA: Insufficient documentation

## 2020-04-04 MED ORDER — OCTREOTIDE ACETATE 30 MG IM KIT
PACK | INTRAMUSCULAR | Status: AC
  Filled 2020-04-04: qty 1

## 2020-04-04 MED ORDER — INFLUENZA VAC SPLIT QUAD 0.5 ML IM SUSY
0.5000 mL | PREFILLED_SYRINGE | Freq: Once | INTRAMUSCULAR | Status: AC
  Administered 2020-04-04: 0.5 mL via INTRAMUSCULAR
  Filled 2020-04-04: qty 0.5

## 2020-04-04 MED ORDER — OCTREOTIDE ACETATE 30 MG IM KIT
30.0000 mg | PACK | Freq: Once | INTRAMUSCULAR | Status: AC
  Administered 2020-04-04: 30 mg via INTRAMUSCULAR

## 2020-04-04 MED ORDER — INFLUENZA VAC SPLIT QUAD 0.5 ML IM SUSY
PREFILLED_SYRINGE | INTRAMUSCULAR | Status: AC
  Filled 2020-04-04: qty 0.5

## 2020-04-04 NOTE — Progress Notes (Signed)
Pt stable at time of discharge. 

## 2020-04-04 NOTE — Patient Instructions (Signed)
Influenza Virus Vaccine (Flucelvax) What is this medicine? INFLUENZA VIRUS VACCINE (in floo EN zuh VAHY ruhs vak SEEN) helps to reduce the risk of getting influenza also known as the flu. The vaccine only helps protect you against some strains of the flu. This medicine may be used for other purposes; ask your health care provider or pharmacist if you have questions. COMMON BRAND NAME(S): FLUCELVAX What should I tell my health care provider before I take this medicine? They need to know if you have any of these conditions:  bleeding disorder like hemophilia  fever or infection  Guillain-Barre syndrome or other neurological problems  immune system problems  infection with the human immunodeficiency virus (HIV) or AIDS  low blood platelet counts  multiple sclerosis  an unusual or allergic reaction to influenza virus vaccine, other medicines, foods, dyes or preservatives  pregnant or trying to get pregnant  breast-feeding How should I use this medicine? This vaccine is for injection into a muscle. It is given by a health care professional. A copy of Vaccine Information Statements will be given before each vaccination. Read this sheet carefully each time. The sheet may change frequently. Talk to your pediatrician regarding the use of this medicine in children. Special care may be needed. Overdosage: If you think you've taken too much of this medicine contact a poison control center or emergency room at once. Overdosage: If you think you have taken too much of this medicine contact a poison control center or emergency room at once. NOTE: This medicine is only for you. Do not share this medicine with others. What if I miss a dose? This does not apply. What may interact with this medicine?  chemotherapy or radiation therapy  medicines that lower your immune system like etanercept, anakinra, infliximab, and adalimumab  medicines that treat or prevent blood clots like  warfarin  phenytoin  steroid medicines like prednisone or cortisone  theophylline  vaccines This list may not describe all possible interactions. Give your health care provider a list of all the medicines, herbs, non-prescription drugs, or dietary supplements you use. Also tell them if you smoke, drink alcohol, or use illegal drugs. Some items may interact with your medicine. What should I watch for while using this medicine? Report any side effects that do not go away within 3 days to your doctor or health care professional. Call your health care provider if any unusual symptoms occur within 6 weeks of receiving this vaccine. You may still catch the flu, but the illness is not usually as bad. You cannot get the flu from the vaccine. The vaccine will not protect against colds or other illnesses that may cause fever. The vaccine is needed every year. What side effects may I notice from receiving this medicine? Side effects that you should report to your doctor or health care professional as soon as possible:  allergic reactions like skin rash, itching or hives, swelling of the face, lips, or tongue Side effects that usually do not require medical attention (Report these to your doctor or health care professional if they continue or are bothersome.):  fever  headache  muscle aches and pains  pain, tenderness, redness, or swelling at the injection site  tiredness This list may not describe all possible side effects. Call your doctor for medical advice about side effects. You may report side effects to FDA at 1-800-FDA-1088. Where should I keep my medicine? The vaccine will be given by a health care professional in a clinic, pharmacy, doctor's   office, or other health care setting. You will not be given vaccine doses to store at home. NOTE: This sheet is a summary. It may not cover all possible information. If you have questions about this medicine, talk to your doctor, pharmacist, or  health care provider.  2020 Elsevier/Gold Standard (2011-05-19 14:06:47) Octreotide acetate injection suspension What is this medicine? OCTREOTIDE (ok TREE oh tide) is used to reduce blood levels of growth hormone in patients with a condition called acromegaly. This medicine also reduces flushing and watery diarrhea caused by certain types of cancer. This medicine may be used for other purposes; ask your health care provider or pharmacist if you have questions. COMMON BRAND NAME(S): Sandostatin LAR What should I tell my health care provider before I take this medicine? They need to know if you have any of these conditions:  diabetes  gallbladder disease  kidney disease  liver disease  thyroid disease  an unusual or allergic reaction to octreotide, other medicines, foods, dyes, or preservatives  pregnant or trying to get pregnant  breast-feeding How should I use this medicine? This medicine is for injection into a muscle. It is usually given by a health care professional in a hospital or clinic setting. Talk to your pediatrician regarding the use of this medicine in children. Special care may be needed. Overdosage: If you think you have taken too much of this medicine contact a poison control center or emergency room at once. NOTE: This medicine is only for you. Do not share this medicine with others. What if I miss a dose? Keep appointments for follow-up doses. It is important not to miss your dose. Call your doctor or health care professional if you are unable to keep an appointment. What may interact with this medicine?  bromocriptine  certain medicines for blood pressure, heart disease, irregular heartbeat  cyclosporine  diuretics  medicines for diabetes, including insulin  quinidine This list may not describe all possible interactions. Give your health care provider a list of all the medicines, herbs, non-prescription drugs, or dietary supplements you use. Also tell  them if you smoke, drink alcohol, or use illegal drugs. Some items may interact with your medicine. What should I watch for while using this medicine? Visit your health care professional for regular checks on your progress. Tell your health care professional if your symptoms do not start to get better or if they get worse. This medicine may cause decreases in blood sugar. Signs of low blood sugar include chills, cool, pale skin or cold sweats, drowsiness, extreme hunger, fast heartbeat, headache, nausea, nervousness or anxiety, shakiness, trembling, unsteadiness, tiredness, or weakness. Contact your doctor or health care professional right away if you experience any of these symptoms. This medicine may increase blood sugar. Ask your healthcare provider if changes in diet or medicines are needed if you have diabetes. This medicine may cause a decrease in vitamin B12. You should make sure that you get enough vitamin B12 while you are taking this medicine. Discuss the foods you eat and the vitamins you take with your health care professional. What side effects may I notice from receiving this medicine? Side effects that you should report to your doctor or health care professional as soon as possible:  allergic reactions like skin rash, itching or hives, swelling of the face, lips, or tongue  fast, slow, or irregular heartbeat  right upper belly pain  severe stomach pain  signs and symptoms of high blood sugar such as being more thirsty or  hungry or having to urinate more than normal. You may also feel very tired or have blurry vision.  signs and symptoms of low blood sugar such as feeling anxious; confusion; dizziness; increased hunger; unusually weak or tired; increased sweating; shakiness; cold, clammy skin; irritable; headache; blurred vision; fast heartbeat; loss of consciousness  unusually weak or tired Side effects that usually do not require medical attention (report these to your doctor or  health care professional if they continue or are bothersome):  diarrhea  dizziness  gas  headache  nausea, vomiting  pain, redness, or irritation at site where injected  upset stomach This list may not describe all possible side effects. Call your doctor for medical advice about side effects. You may report side effects to FDA at 1-800-FDA-1088. Where should I keep my medicine? This medicine is given in a hospital or clinic and will not be stored at home. NOTE: This sheet is a summary. It may not cover all possible information. If you have questions about this medicine, talk to your doctor, pharmacist, or health care provider.  2020 Elsevier/Gold Standard (2019-01-03 14:59:56)

## 2020-04-07 ENCOUNTER — Telehealth: Payer: Self-pay | Admitting: Internal Medicine

## 2020-04-07 NOTE — Telephone Encounter (Signed)
Pt scheduled for previsit 04/15/20@11am . EGD scheduled in the [facility] 04/24/20 at 9:30am. Pt aware of appt.

## 2020-04-15 ENCOUNTER — Encounter: Payer: Self-pay | Admitting: Internal Medicine

## 2020-04-15 ENCOUNTER — Ambulatory Visit (AMBULATORY_SURGERY_CENTER): Payer: Self-pay | Admitting: *Deleted

## 2020-04-15 ENCOUNTER — Other Ambulatory Visit: Payer: Self-pay

## 2020-04-15 VITALS — Ht 67.0 in | Wt 138.0 lb

## 2020-04-15 DIAGNOSIS — R109 Unspecified abdominal pain: Secondary | ICD-10-CM

## 2020-04-15 DIAGNOSIS — R112 Nausea with vomiting, unspecified: Secondary | ICD-10-CM

## 2020-04-15 DIAGNOSIS — C169 Malignant neoplasm of stomach, unspecified: Secondary | ICD-10-CM

## 2020-04-15 NOTE — Progress Notes (Signed)
Patient is here in-person for PV. No changes in medical hx since last GI OV per pt. Patient denies any allergies to eggs or soy. Patient denies any problems with anesthesia/sedation. Patient denies any oxygen use at home. Patient denies taking any diet/weight loss medications or blood thinners. Patient is not being treated for MRSA or C-diff. Patient is aware of our care-partner policy and [id number] safety protocol.  COVID-19 vaccines completed on 02/2020, per patient.

## 2020-04-17 DIAGNOSIS — C50111 Malignant neoplasm of central portion of right female breast: Secondary | ICD-10-CM | POA: Diagnosis not present

## 2020-04-18 DIAGNOSIS — C50911 Malignant neoplasm of unspecified site of right female breast: Secondary | ICD-10-CM | POA: Diagnosis not present

## 2020-04-18 DIAGNOSIS — Z0000 Encounter for general adult medical examination without abnormal findings: Secondary | ICD-10-CM | POA: Diagnosis not present

## 2020-04-18 DIAGNOSIS — F17200 Nicotine dependence, unspecified, uncomplicated: Secondary | ICD-10-CM | POA: Diagnosis not present

## 2020-04-18 DIAGNOSIS — Z01419 Encounter for gynecological examination (general) (routine) without abnormal findings: Secondary | ICD-10-CM | POA: Diagnosis not present

## 2020-04-18 DIAGNOSIS — Z1151 Encounter for screening for human papillomavirus (HPV): Secondary | ICD-10-CM | POA: Diagnosis not present

## 2020-04-18 DIAGNOSIS — E785 Hyperlipidemia, unspecified: Secondary | ICD-10-CM | POA: Diagnosis not present

## 2020-04-18 DIAGNOSIS — Z124 Encounter for screening for malignant neoplasm of cervix: Secondary | ICD-10-CM | POA: Diagnosis not present

## 2020-04-24 ENCOUNTER — Other Ambulatory Visit: Payer: Self-pay

## 2020-04-24 ENCOUNTER — Ambulatory Visit (AMBULATORY_SURGERY_CENTER): Payer: PPO | Admitting: Internal Medicine

## 2020-04-24 ENCOUNTER — Encounter: Payer: Self-pay | Admitting: Internal Medicine

## 2020-04-24 VITALS — BP 159/79 | HR 80 | Temp 97.5°F | Resp 23 | Ht 67.0 in | Wt 138.0 lb

## 2020-04-24 DIAGNOSIS — R109 Unspecified abdominal pain: Secondary | ICD-10-CM

## 2020-04-24 DIAGNOSIS — C162 Malignant neoplasm of body of stomach: Secondary | ICD-10-CM | POA: Diagnosis not present

## 2020-04-24 DIAGNOSIS — R112 Nausea with vomiting, unspecified: Secondary | ICD-10-CM

## 2020-04-24 DIAGNOSIS — C169 Malignant neoplasm of stomach, unspecified: Secondary | ICD-10-CM | POA: Diagnosis not present

## 2020-04-24 DIAGNOSIS — K219 Gastro-esophageal reflux disease without esophagitis: Secondary | ICD-10-CM | POA: Diagnosis not present

## 2020-04-24 NOTE — Progress Notes (Signed)
After IV removal patient complained that site was still burning as it was previous to removal.  On assessment it did look a little puffy so warm compresses applied and patient advised to call back if it continues to burn.

## 2020-04-24 NOTE — Progress Notes (Signed)
Pt's states no medical or surgical changes since previsit or office visit. 

## 2020-04-24 NOTE — Progress Notes (Signed)
Called to room to assist during endoscopic procedure.  Patient ID and intended procedure confirmed with present staff. Received instructions for my participation in the procedure from the performing physician.  

## 2020-04-24 NOTE — Patient Instructions (Signed)
YOU HAD AN ENDOSCOPIC PROCEDURE TODAY AT THE [facility]:   Refer to the procedure report that was given to you for any specific questions about what was found during the examination.  If the procedure report does not answer your questions, please call your gastroenterologist to clarify.  If you requested that your care partner not be given the details of your procedure findings, then the procedure report has been included in a sealed envelope for you to review at your convenience later.  YOU SHOULD EXPECT: Some feelings of bloating in the abdomen. Passage of more gas than usual.  Walking can help get rid of the air that was put into your GI tract during the procedure and reduce the bloating. If you had a lower endoscopy (such as a colonoscopy or flexible sigmoidoscopy) you may notice spotting of blood in your stool or on the toilet paper. If you underwent a bowel prep for your procedure, you may not have a normal bowel movement for a few days.  Please Note:  You might notice some irritation and congestion in your nose or some drainage.  This is from the oxygen used during your procedure.  There is no need for concern and it should clear up in a day or so.  SYMPTOMS TO REPORT IMMEDIATELY:    Following upper endoscopy (EGD)  Vomiting of blood or coffee ground material  New chest pain or pain under the shoulder blades  Painful or persistently difficult swallowing  New shortness of breath  Fever of 100F or higher  Black, tarry-looking stools  For urgent or emergent issues, a gastroenterologist can be reached at any hour by calling [phone]. Do not use MyChart messaging for urgent concerns.    DIET:  We do recommend a small meal at first, but then you may proceed to your regular diet.  Drink plenty of fluids but you should avoid alcoholic beverages for 24 hours.  ACTIVITY:  You should plan to take it easy for the rest of today and you should NOT DRIVE or use heavy machinery  until tomorrow (because of the sedation medicines used during the test).    FOLLOW UP: Our staff will call the number listed on your records 48-72 hours following your procedure to check on you and address any questions or concerns that you may have regarding the information given to you following your procedure. If we do not reach you, we will leave a message.  We will attempt to reach you two times.  During this call, we will ask if you have developed any symptoms of COVID 19. If you develop any symptoms (ie: fever, flu-like symptoms, shortness of breath, cough etc.) before then, please call [phone].  If you test positive for Covid 19 in the 2 weeks post procedure, please call and report this information to us.    If any biopsies were taken you will be contacted by phone or by letter within the next 1-3 weeks.  Please call us at [phone] if you have not heard about the biopsies in 3 weeks.    SIGNATURES/CONFIDENTIALITY: You and/or your care partner have signed paperwork which will be entered into your electronic medical record.  These signatures attest to the fact that that the information above on your After Visit Summary has been reviewed and is understood.  Full responsibility of the confidentiality of this discharge information lies with you and/or your care-partner. 

## 2020-04-24 NOTE — Progress Notes (Signed)
VS taken by C.W. 

## 2020-04-24 NOTE — Op Note (Signed)
[facility] Patient Name: Barbara Mayo Procedure Date: 04/24/2020 10:05 AM MRN: [id number] Endoscopist: Docia Chuck. Henrene Pastor MD, MD Age: 59 Referring MD:  Date of Birth: [birth year] Gender: Female Account #: [id number] Procedure:                Upper GI endoscopy with biopsies Indications:              Follow-up of gastric tumor. Believed to have                            metastatic breast to stomach diagnosed May 2021. On                            chemotherapy. Now for follow-up Medicines:                Monitored Anesthesia Care Procedure:                Pre-Anesthesia Assessment:                           - Prior to the procedure, a History and Physical                            was performed, and patient medications and                            allergies were reviewed. The patient's tolerance of                            previous anesthesia was also reviewed. The risks                            and benefits of the procedure and the sedation                            options and risks were discussed with the patient.                            All questions were answered, and informed consent                            was obtained. Prior Anticoagulants: The patient has                            taken no previous anticoagulant or antiplatelet                            agents. ASA Grade Assessment: III - A patient with                            severe systemic disease. After reviewing the risks                            and benefits, the patient was deemed in  satisfactory condition to undergo the procedure.                           After obtaining informed consent, the endoscope was                            passed under direct vision. Throughout the                            procedure, the patient's blood pressure, pulse, and                            oxygen saturations were monitored continuously. The                             Endoscope was introduced through the mouth, and                            advanced to the second part of duodenum. The upper                            GI endoscopy was accomplished without difficulty.                            The patient tolerated the procedure well. Scope In: Scope Out: Findings:                 The esophagus was normal, save small tongue of                            columnar mucosa.                           The stomach revealed nodular mucosa with slight                            friability in the body. Multiple biopsies were                            taken with a cold forceps for histology.                           The examined duodenum was normal.                           The cardia and gastric fundus were normal on                            retroflexion. Complications:            No immediate complications. Estimated Blood Loss:     Estimated blood loss: none. Impression:               1. Nodular gastric mucosa as described status post  biopsies                           2. History of metastatic breast cancer to the                            stomach (diagnosed May 2021) currently on                            chemotherapy Recommendation:           - Patient has a contact number available for                            emergencies. The signs and symptoms of potential                            delayed complications were discussed with the                            patient. Return to normal activities tomorrow.                            Written discharge instructions were provided to the                            patient.                           - Resume previous diet.                           - Continue present medications.                           - Await pathology results.                           - Return to the care of Dr. Bobby Rumpf. Follow-up                            endoscopy with biopsies as requested by your                             oncology team Docia Chuck. Henrene Pastor MD, MD 04/24/2020 10:32:54 AM This report has been signed electronically.

## 2020-04-24 NOTE — Progress Notes (Signed)
Report to PACU, RN, vss, BBS= Clear.  

## 2020-04-25 ENCOUNTER — Encounter: Payer: Self-pay | Admitting: Oncology

## 2020-04-25 ENCOUNTER — Other Ambulatory Visit: Payer: Self-pay | Admitting: Oncology

## 2020-04-25 ENCOUNTER — Inpatient Hospital Stay: Payer: PPO | Attending: Oncology | Admitting: Oncology

## 2020-04-25 VITALS — BP 123/67 | HR 88 | Temp 98.4°F | Resp 16 | Ht 65.5 in | Wt 138.6 lb

## 2020-04-25 DIAGNOSIS — K760 Fatty (change of) liver, not elsewhere classified: Secondary | ICD-10-CM | POA: Diagnosis not present

## 2020-04-25 DIAGNOSIS — R197 Diarrhea, unspecified: Secondary | ICD-10-CM | POA: Insufficient documentation

## 2020-04-25 DIAGNOSIS — C785 Secondary malignant neoplasm of large intestine and rectum: Secondary | ICD-10-CM | POA: Diagnosis not present

## 2020-04-25 DIAGNOSIS — C189 Malignant neoplasm of colon, unspecified: Secondary | ICD-10-CM | POA: Diagnosis not present

## 2020-04-25 DIAGNOSIS — K6289 Other specified diseases of anus and rectum: Secondary | ICD-10-CM | POA: Diagnosis not present

## 2020-04-25 DIAGNOSIS — Z170 Estrogen receptor positive status [ER+]: Secondary | ICD-10-CM

## 2020-04-25 DIAGNOSIS — C7889 Secondary malignant neoplasm of other digestive organs: Secondary | ICD-10-CM | POA: Insufficient documentation

## 2020-04-25 DIAGNOSIS — Q278 Other specified congenital malformations of peripheral vascular system: Secondary | ICD-10-CM | POA: Diagnosis not present

## 2020-04-25 DIAGNOSIS — C50111 Malignant neoplasm of central portion of right female breast: Secondary | ICD-10-CM

## 2020-04-25 DIAGNOSIS — J984 Other disorders of lung: Secondary | ICD-10-CM | POA: Diagnosis not present

## 2020-04-25 DIAGNOSIS — C50919 Malignant neoplasm of unspecified site of unspecified female breast: Secondary | ICD-10-CM | POA: Diagnosis not present

## 2020-04-25 DIAGNOSIS — I700 Atherosclerosis of aorta: Secondary | ICD-10-CM | POA: Diagnosis not present

## 2020-04-25 DIAGNOSIS — Z85038 Personal history of other malignant neoplasm of large intestine: Secondary | ICD-10-CM | POA: Diagnosis not present

## 2020-04-25 DIAGNOSIS — Z9049 Acquired absence of other specified parts of digestive tract: Secondary | ICD-10-CM | POA: Diagnosis not present

## 2020-04-25 DIAGNOSIS — K6389 Other specified diseases of intestine: Secondary | ICD-10-CM | POA: Diagnosis not present

## 2020-04-25 DIAGNOSIS — Z9011 Acquired absence of right breast and nipple: Secondary | ICD-10-CM | POA: Diagnosis not present

## 2020-04-25 DIAGNOSIS — Z853 Personal history of malignant neoplasm of breast: Secondary | ICD-10-CM | POA: Diagnosis not present

## 2020-04-25 MED ORDER — MEGESTROL ACETATE 625 MG/5ML PO SUSP
625.0000 mg | Freq: Every day | ORAL | 1 refills | Status: DC
Start: 2020-04-25 — End: 2020-05-06

## 2020-04-25 MED ORDER — HYDROXYZINE HCL 50 MG PO TABS: 50.0000 mg | ORAL_TABLET | Freq: Four times a day (QID) | ORAL | Status: DC | PRN

## 2020-04-26 NOTE — Progress Notes (Signed)
[facility]  [address] [place],  [place]  [address] [phone]  Clinic Day:  04/25/20  Referring physician: Greig Right, MD   HISTORY OF PRESENT ILLNESS:  The patient is a 59 y.o. female with metastatic hormone positive breast cancer, which includes spread of disease to her stomach, which was proven per an EGD with biopsy.  She has been taking everolimus/exemestane over the past few months.  Because her everolimus continued to cause significant pruritus, it and her exemestane were discontinued 8 days ago.  She comes in today to go over her CT scans to ascertain her new disease baseline.  Despite being off therapy for the past 8 days, pruritus remains a significant issue.  Although previous antihistamines have been tried, the patient is interested in trying hydroxyzine.  Of note, the patient just had a repeat EGD done this week to see if her cancer is still within her stomach lining.  Biopsies from her EGD are pending.  From a GI standpoint, her diarrhea remains prominent despite receiving cholestyramine and Sandostatin.  Her husband is concerned about her increased nausea/vomiting and her weight loss.     PHYSICAL EXAM:  Blood pressure 123/67, pulse 88, temperature 98.4 F (36.9 C), temperature source Oral, resp. rate 16, height 5' 5.5" (1.664 m), weight 138 lb 9.6 oz (62.9 kg), SpO2 100 %. Wt Readings from Last 3 Encounters:  04/25/20 138 lb 9.6 oz (62.9 kg)  04/24/20 138 lb (62.6 kg)  04/15/20 138 lb (62.6 kg)   Body mass index is 22.71 kg/m. Performance status (ECOG): 1 Physical Exam Constitutional:      Appearance: Normal appearance. She is not ill-appearing (she does look thinner vs previous visit).  HENT:     Mouth/Throat:     Mouth: Mucous membranes are moist.     Pharynx: Oropharynx is clear. No oropharyngeal exudate or posterior oropharyngeal erythema.  Cardiovascular:     Rate and Rhythm: Normal rate and regular rhythm.     Heart  sounds: No murmur heard.  No friction rub. No gallop.   Pulmonary:     Effort: Pulmonary effort is normal. No respiratory distress.     Breath sounds: Normal breath sounds. No wheezing, rhonchi or rales.  Abdominal:     General: Bowel sounds are normal. There is no distension.     Palpations: Abdomen is soft. There is no mass.     Tenderness: There is no abdominal tenderness.  Musculoskeletal:        General: No swelling.     Right lower leg: No edema.     Left lower leg: No edema.  Lymphadenopathy:     Cervical: No cervical adenopathy.     Upper Body:     Right upper body: No supraclavicular or axillary adenopathy.     Left upper body: No supraclavicular or axillary adenopathy.     Lower Body: No right inguinal adenopathy. No left inguinal adenopathy.  Skin:    General: Skin is warm.     Coloration: Skin is not jaundiced.     Findings: No lesion or rash.  Neurological:     General: No focal deficit present.     Mental Status: She is alert and oriented to person, place, and time. Mental status is at baseline.     Cranial Nerves: Cranial nerves are intact.  Psychiatric:        Mood and Affect: Mood normal.        Behavior: Behavior normal.  Thought Content: Thought content normal.     STUDIES:  CT scans of her chest/abdomen/pelvis revealed the following: FINDINGS: CT CHEST FINDINGS  Cardiovascular: The heart size is normal. No substantial pericardial effusion. Atherosclerotic calcification is noted in the wall of the thoracic aorta. Aberrant origin right subclavian artery noted.  Mediastinum/Nodes: No mediastinal lymphadenopathy. There is no hilar lymphadenopathy. The esophagus has normal imaging features. There is no axillary lymphadenopathy.  Lungs/Pleura: Apical scarring noted bilaterally, right greater than left and stable. Subpleural reticulation anterior right upper lobe is compatible with radiation fibrosis. 4 mm right middle lobe nodule on 86/4 is  unchanged consistent with benign etiology. No new suspicious pulmonary nodule or mass. No focal airspace consolidation. No pleural effusion.  Musculoskeletal: No worrisome lytic or sclerotic osseous abnormality. Right mastectomy.  CT ABDOMEN PELVIS FINDINGS  Hepatobiliary: The liver shows diffusely decreased attenuation suggesting fat deposition. Gallbladder is surgically absent. No intrahepatic or extrahepatic biliary dilation.  Pancreas: No focal mass lesion. No dilatation of the main duct. No intraparenchymal cyst. No peripancreatic edema.  Spleen: No splenomegaly. No focal mass lesion.  Adrenals/Urinary Tract: No adrenal nodule or mass. Mild fullness right intrarenal collecting system is mildly progressed in the interval. Left kidney unremarkable. No evidence for hydroureter. The urinary bladder appears normal for the degree of distention.  Stomach/Bowel: Stomach is unremarkable. No gastric wall thickening. No evidence of outlet obstruction. Duodenum is normally positioned as is the ligament of Treitz. No small bowel wall thickening. No small bowel dilatation. Status post right hemicolectomy there is mild diffuse distention of small bowel, similar to prior measuring up to 3.7 cm diameter in the central pelvis. Contrast material has migrated through to the level of the rectum.  Vascular/Lymphatic: There is abdominal aortic atherosclerosis without aneurysm. There is no gastrohepatic or hepatoduodenal ligament lymphadenopathy. No retroperitoneal or mesenteric lymphadenopathy. No pelvic sidewall lymphadenopathy.  Reproductive: There is no adnexal mass.  Other: No intraperitoneal free fluid.  Musculoskeletal: No worrisome lytic or sclerotic osseous abnormality.  IMPRESSION: 1. Stable exam. No new or progressive findings to suggest recurrent or metastatic disease in the chest, abdomen, or pelvis. 2. Status post right hemicolectomy with mild diffuse distention of small  bowel, similar to prior. Contrast material has migrated through to the level of the rectum. Imaging features may be related to chronic dysmotility or ileus. 3. Hepatic steatosis. 4. Aortic Atherosclerosis (ICD10-I70.0).   ASSESSMENT & PLAN:   Assessment/Plan:  A 59 y.o. female with metastatic hormone positive breast cancer.  In clinic today, I went over all of her CT scan images with her, for which she could see there remains no radiographic evidence of disease progression.  Understandably, the patient was pleased with these results.  As mentioned previously, biopsies from her EGD done this week are pending.  Even if there is evidence of persistent cancer within her stomach lining, as she has no radiographic evidence of metastatic disease, I am inclined not to switch therapy.  Despite her issues with her recent therapy, the patient wishes to get back to taking her everolimus/exemestane, without altering the dose of each.  I have no problem with her doing this.  I will prescribe her hydroxyzine, which she can take 50-100 q6h prn pruritus.  With respect to her nausea, I will prescribe her sublingual Zofran, which she can take every 6h prn.  I will also prescribe her Megace to help with appetite stimulation and weight gain.  She will continue to receive octreotide to help with her diarrhea.  She  declined restarting tincture of opium for this problem.  Moving forward, the patient knows we will continue to adjust medications accordingly to improve her daily quality of life.  I will see her back in 1 month for repeat clinical assessment.  The patient understands all the plans discussed today and is in agreement with them.      Jearld Hemp Macarthur Critchley, MD

## 2020-04-28 ENCOUNTER — Telehealth: Payer: Self-pay | Admitting: *Deleted

## 2020-04-28 ENCOUNTER — Ambulatory Visit: Payer: PPO | Admitting: Oncology

## 2020-04-28 ENCOUNTER — Other Ambulatory Visit: Payer: Self-pay | Admitting: Oncology

## 2020-04-28 NOTE — Telephone Encounter (Signed)
  Follow up Call-  Call back number 04/24/2020 11/02/2019  Post procedure Call Back phone  # [phone]  Permission to leave phone message Yes Yes  Some recent data might be hidden     Patient questions:  Do you have a fever, pain , or abdominal swelling? No. Pain Score  0 *  Have you tolerated food without any problems? Yes.    Have you been able to return to your normal activities? Yes.    Do you have any questions about your discharge instructions: Diet   No. Medications  No. Follow up visit  No.  Do you have questions or concerns about your Care? No.  Actions: * If pain score is 4 or above: 1. No action needed, pain <4.Have you developed a fever since your procedure? no  2.   Have you had an respiratory symptoms (SOB or cough) since your procedure? no  3.   Have you tested positive for COVID 19 since your procedure no  4.   Have you had any family members/close contacts diagnosed with the COVID 19 since your procedure?  no   If yes to any of these questions please route to Joylene John, RN and Joella Prince, RN

## 2020-05-02 ENCOUNTER — Other Ambulatory Visit: Payer: Self-pay

## 2020-05-02 ENCOUNTER — Inpatient Hospital Stay: Payer: PPO

## 2020-05-02 VITALS — BP 115/66 | HR 89 | Temp 98.4°F | Resp 18 | Ht 65.5 in | Wt 140.5 lb

## 2020-05-02 DIAGNOSIS — C7889 Secondary malignant neoplasm of other digestive organs: Secondary | ICD-10-CM | POA: Diagnosis not present

## 2020-05-02 DIAGNOSIS — C50919 Malignant neoplasm of unspecified site of unspecified female breast: Secondary | ICD-10-CM | POA: Diagnosis not present

## 2020-05-02 DIAGNOSIS — R197 Diarrhea, unspecified: Secondary | ICD-10-CM

## 2020-05-02 DIAGNOSIS — Z170 Estrogen receptor positive status [ER+]: Secondary | ICD-10-CM | POA: Diagnosis not present

## 2020-05-02 MED ORDER — OCTREOTIDE ACETATE 30 MG IM KIT
30.0000 mg | PACK | Freq: Once | INTRAMUSCULAR | Status: AC
  Administered 2020-05-02: 30 mg via INTRAMUSCULAR

## 2020-05-02 MED ORDER — OCTREOTIDE ACETATE 30 MG IM KIT
PACK | INTRAMUSCULAR | Status: AC
  Filled 2020-05-02: qty 1

## 2020-05-02 NOTE — Progress Notes (Signed)
PT STABLE AT TIME OF DISCHARGE 

## 2020-05-02 NOTE — Patient Instructions (Signed)
Octreotide injection solution What is this medicine? OCTREOTIDE (ok TREE oh tide) is used to reduce blood levels of growth hormone in patients with a condition called acromegaly. This medicine also reduces flushing and watery diarrhea caused by certain types of cancer. This medicine may be used for other purposes; ask your health care provider or pharmacist if you have questions. COMMON BRAND NAME(S): Bynfezia, Sandostatin What should I tell my health care provider before I take this medicine? They need to know if you have any of these conditions:  diabetes  gallbladder disease  kidney disease  liver disease  thyroid disease  an unusual or allergic reaction to octreotide, other medicines, foods, dyes, or preservatives  pregnant or trying to get pregnant  breast-feeding How should I use this medicine? This medicine is for injection under the skin or into a vein (only in emergency situations). It is usually given by a health care professional in a hospital or clinic setting. If you get this medicine at home, you will be taught how to prepare and give this medicine. Allow the injection solution to come to room temperature before use. Do not warm it artificially. Use exactly as directed. Take your medicine at regular intervals. Do not take your medicine more often than directed. It is important that you put your used needles and syringes in a special sharps container. Do not put them in a trash can. If you do not have a sharps container, call your pharmacist or healthcare provider to get one. Talk to your pediatrician regarding the use of this medicine in children. Special care may be needed. Overdosage: If you think you have taken too much of this medicine contact a poison control center or emergency room at once. NOTE: This medicine is only for you. Do not share this medicine with others. What if I miss a dose? If you miss a dose, take it as soon as you can. If it is almost time for your  next dose, take only that dose. Do not take double or extra doses. What may interact with this medicine?  bromocriptine  certain medicines for blood pressure, heart disease, irregular heartbeat  cyclosporine  diuretics  medicines for diabetes, including insulin  quinidine This list may not describe all possible interactions. Give your health care provider a list of all the medicines, herbs, non-prescription drugs, or dietary supplements you use. Also tell them if you smoke, drink alcohol, or use illegal drugs. Some items may interact with your medicine. What should I watch for while using this medicine? Visit your doctor or health care professional for regular checks on your progress. To help reduce irritation at the injection site, use a different site for each injection and make sure the solution is at room temperature before use. This medicine may cause decreases in blood sugar. Signs of low blood sugar include chills, cool, pale skin or cold sweats, drowsiness, extreme hunger, fast heartbeat, headache, nausea, nervousness or anxiety, shakiness, trembling, unsteadiness, tiredness, or weakness. Contact your doctor or health care professional right away if you experience any of these symptoms. This medicine may increase blood sugar. Ask your healthcare provider if changes in diet or medicines are needed if you have diabetes. This medicine may cause a decrease in vitamin B12. You should make sure that you get enough vitamin B12 while you are taking this medicine. Discuss the foods you eat and the vitamins you take with your health care professional. What side effects may I notice from receiving this medicine? Side   effects that you should report to your doctor or health care professional as soon as possible:  allergic reactions like skin rash, itching or hives, swelling of the face, lips, or tongue  fast, slow, or irregular heartbeat  right upper belly pain  severe stomach pain  signs  and symptoms of high blood sugar such as being more thirsty or hungry or having to urinate more than normal. You may also feel very tired or have blurry vision.  signs and symptoms of low blood sugar such as feeling anxious; confusion; dizziness; increased hunger; unusually weak or tired; increased sweating; shakiness; cold, clammy skin; irritable; headache; blurred vision; fast heartbeat; loss of consciousness  unusually weak or tired Side effects that usually do not require medical attention (report to your doctor or health care professional if they continue or are bothersome):  diarrhea  dizziness  gas  headache  nausea, vomiting  pain, redness, or irritation at site where injected  upset stomach This list may not describe all possible side effects. Call your doctor for medical advice about side effects. You may report side effects to FDA at 1-800-FDA-1088. Where should I keep my medicine? Keep out of the reach of children. Store in a refrigerator between 2 and 8 degrees C (36 and 46 degrees F). Protect from light. Allow to come to room temperature naturally. Do not use artificial heat. If protected from light, the injection may be stored at room temperature between 20 and 30 degrees C (70 and 86 degrees F) for 14 days. After the initial use, throw away any unused portion of a multiple dose vial after 14 days. Throw away unused portions of the ampules after use. NOTE: This sheet is a summary. It may not cover all possible information. If you have questions about this medicine, talk to your doctor, pharmacist, or health care provider.  2020 Elsevier/Gold Standard (2019-01-04 13:33:09)  

## 2020-05-28 ENCOUNTER — Other Ambulatory Visit: Payer: Self-pay | Admitting: Oncology

## 2020-05-28 DIAGNOSIS — C50111 Malignant neoplasm of central portion of right female breast: Secondary | ICD-10-CM

## 2020-05-28 NOTE — Progress Notes (Signed)
[facility]  [address] [place],  [place]  [address] [phone]  Clinic Day:  05/29/2020  Referring physician: Greig Right, MD   HISTORY OF PRESENT ILLNESS:  The patient is a 59 y.o. female with metastatic hormone positive breast cancer, which includes spread of disease to her stomach, which was proven per an EGD with biopsy.  Recent CT scans showed no evidence of metastatic disease.  However, a recent EGD with biopsy still showed cancer within her stomach lining.   She is still taking everolimus/exemestane.  Outside of pruritus, she is tolerating this regimen very well.  Unfortunately, from a GI standpoint, her diarrhea remains prominent despite receiving cholestyramine and Sandostatin.  The concern is the cancer in her stomach is preventing adequate nutrient absorption.  Her husband is understandably concerned as she continues to lose a significant amount of weight.     PHYSICAL EXAM:  Blood pressure 134/79, pulse (!) 103, temperature 97.7 F (36.5 C), resp. rate 16, height 5' 5.5" (1.664 m), weight 125 lb 1.6 oz (56.7 kg), SpO2 99 %. Wt Readings from Last 3 Encounters:  05/29/20 125 lb 8 oz (56.9 kg)  05/29/20 125 lb 1.6 oz (56.7 kg)  05/02/20 140 lb 8 oz (63.7 kg)   Body mass index is 20.5 kg/m. Performance status (ECOG): 1 Physical Exam Constitutional:      Appearance: Normal appearance. She is not ill-appearing (she does look much thinner vs previous visit).  HENT:     Mouth/Throat:     Mouth: Mucous membranes are moist.     Pharynx: Oropharynx is clear. No oropharyngeal exudate or posterior oropharyngeal erythema.  Cardiovascular:     Rate and Rhythm: Normal rate and regular rhythm.     Heart sounds: No murmur heard. No friction rub. No gallop.   Pulmonary:     Effort: Pulmonary effort is normal. No respiratory distress.     Breath sounds: Normal breath sounds. No wheezing, rhonchi or rales.  Chest:  Breasts:     Right: No  axillary adenopathy or supraclavicular adenopathy.     Left: No axillary adenopathy or supraclavicular adenopathy.    Abdominal:     General: Bowel sounds are normal. There is no distension.     Palpations: Abdomen is soft. There is no mass.     Tenderness: There is no abdominal tenderness.  Musculoskeletal:        General: No swelling.     Right lower leg: No edema.     Left lower leg: No edema.  Lymphadenopathy:     Cervical: No cervical adenopathy.     Upper Body:     Right upper body: No supraclavicular or axillary adenopathy.     Left upper body: No supraclavicular or axillary adenopathy.     Lower Body: No right inguinal adenopathy. No left inguinal adenopathy.  Skin:    General: Skin is warm.     Coloration: Skin is not jaundiced.     Findings: No lesion or rash.  Neurological:     General: No focal deficit present.     Mental Status: She is alert and oriented to person, place, and time. Mental status is at baseline.     Cranial Nerves: Cranial nerves are intact.  Psychiatric:        Mood and Affect: Mood normal.        Behavior: Behavior normal.        Thought Content: Thought content normal.   LABS:  Ref. Range 05/29/2020 00:00  Sodium Latest Ref Range: 137 - 147  141  Potassium Latest Ref Range: 3.4 - 5.3  2.9 (A)  Chloride Latest Ref Range: 99 - 108  110 (A)  CO2 Latest Ref Range: 13 - 22  17  Glucose Unknown 120  BUN Latest Ref Range: 4 - 21  35 (A)  Creatinine Latest Ref Range: 0.5 - 1.1  2.1 (A)  Calcium Latest Ref Range: 8.7 - 10.7  8.5 (A)  Alkaline Phosphatase Latest Ref Range: 25 - 125  158 (A)  Albumin Latest Ref Range: 3.5 - 5.0  4.0  AST Latest Ref Range: 13 - 35  44 (A)  ALT Latest Ref Range: 7 - 35  42 (A)  Bilirubin, Total Unknown 0.5  Iron Unknown 46  TIBC Unknown 272  %SAT Unknown 16.9  Ferritin Unknown 214  WBC Unknown 5.6  RBC Latest Ref Range: 3.87 - 5.11  3.85 (A)  Hemoglobin Latest Ref Range: 12.0 - 16.0  10.9 (A)  HCT Latest Ref  Range: 36 - 46  33 (A)  MCV Latest Ref Range: 76 - 111  85  Platelets Latest Ref Range: 150 - 399  231     ASSESSMENT & PLAN:  Assessment/Plan:  A 59 y.o. female with metastatic hormone positive breast cancer, including spread of disease to her stomach.  I am becoming very concerned with her weight loss.  Although she takes Megace daily, her weight continues to dwindle.  She claims nausea is an issue.  Because of this, I will prescribe her sublingual ondansetron.  This prescription was apparently not filled at her last visit.  She will continue to receive octreotide to help with her diarrhea.  She knows to also increase the fiber in her diet.  She will continue taking everolimus/exemestane for her disease manaaement. She still has hydroxyzine, which she can take 50-100 q6h prn pruritus.  When evaluating her labs, her kidney function is getting worse.  I will give her 2L of normal saline.  Her potassium will also be repleted intravenously.  I will see her back in 1 month for repeat clinical assessment.  CT scans will be done a day before her next visit to ascertain her new disease baseline.  Because I am concerned her cancer is not responding to her everolimus/exemestane, I will have her previously resected metastatic bowel disease sent to [facility] to determine if any targeted therapy, outside of endocrine therapy, can be used to treat her disease.  The patient understands all the plans discussed today and is in agreement with them.      Maile Linford Macarthur Critchley, MD

## 2020-05-29 ENCOUNTER — Encounter: Payer: Self-pay | Admitting: Oncology

## 2020-05-29 ENCOUNTER — Inpatient Hospital Stay: Payer: PPO

## 2020-05-29 ENCOUNTER — Inpatient Hospital Stay: Payer: PPO | Attending: Oncology | Admitting: Oncology

## 2020-05-29 ENCOUNTER — Other Ambulatory Visit: Payer: Self-pay | Admitting: Pharmacist

## 2020-05-29 ENCOUNTER — Other Ambulatory Visit: Payer: Self-pay | Admitting: Oncology

## 2020-05-29 ENCOUNTER — Telehealth: Payer: Self-pay | Admitting: Oncology

## 2020-05-29 ENCOUNTER — Other Ambulatory Visit: Payer: Self-pay | Admitting: Hematology and Oncology

## 2020-05-29 ENCOUNTER — Other Ambulatory Visit: Payer: Self-pay

## 2020-05-29 VITALS — BP 134/79 | HR 103 | Temp 97.7°F | Resp 16 | Ht 65.5 in | Wt 125.1 lb

## 2020-05-29 VITALS — BP 111/73 | HR 85 | Temp 97.9°F | Resp 18 | Ht 65.5 in | Wt 125.5 lb

## 2020-05-29 DIAGNOSIS — R197 Diarrhea, unspecified: Secondary | ICD-10-CM | POA: Diagnosis not present

## 2020-05-29 DIAGNOSIS — C50919 Malignant neoplasm of unspecified site of unspecified female breast: Secondary | ICD-10-CM | POA: Insufficient documentation

## 2020-05-29 DIAGNOSIS — C50111 Malignant neoplasm of central portion of right female breast: Secondary | ICD-10-CM

## 2020-05-29 DIAGNOSIS — C7889 Secondary malignant neoplasm of other digestive organs: Secondary | ICD-10-CM | POA: Insufficient documentation

## 2020-05-29 DIAGNOSIS — Z170 Estrogen receptor positive status [ER+]: Secondary | ICD-10-CM

## 2020-05-29 DIAGNOSIS — Z0001 Encounter for general adult medical examination with abnormal findings: Secondary | ICD-10-CM | POA: Diagnosis not present

## 2020-05-29 DIAGNOSIS — D649 Anemia, unspecified: Secondary | ICD-10-CM | POA: Diagnosis not present

## 2020-05-29 LAB — CBC AND DIFFERENTIAL
HCT: 33 — AB (ref 36–46)
Hemoglobin: 10.9 — AB (ref 12.0–16.0)
Neutrophils Absolute: 3.36
Platelets: 231 (ref 150–399)
WBC: 5.6

## 2020-05-29 LAB — HEPATIC FUNCTION PANEL
ALT: 42 — AB (ref 7–35)
AST: 44 — AB (ref 13–35)
Alkaline Phosphatase: 158 — AB (ref 25–125)
Bilirubin, Total: 0.5

## 2020-05-29 LAB — BASIC METABOLIC PANEL
BUN: 35 — AB (ref 4–21)
CO2: 17 (ref 13–22)
Chloride: 110 — AB (ref 99–108)
Creatinine: 2.1 — AB (ref 0.5–1.1)
Glucose: 120
Potassium: 2.9 — AB (ref 3.4–5.3)
Sodium: 141 (ref 137–147)

## 2020-05-29 LAB — COMPREHENSIVE METABOLIC PANEL
Albumin: 4 (ref 3.5–5.0)
Calcium: 8.5 — AB (ref 8.7–10.7)

## 2020-05-29 LAB — CBC
MCV: 85 (ref 76–111)
RBC: 3.85 — AB (ref 3.87–5.11)

## 2020-05-29 LAB — IRON,TIBC AND FERRITIN PANEL
%SAT: 16.9
Ferritin: 214
Iron: 46
TIBC: 272

## 2020-05-29 MED ORDER — SODIUM CHLORIDE 0.9 % IV SOLN
Freq: Once | INTRAVENOUS | Status: AC
  Filled 2020-05-29: qty 250

## 2020-05-29 MED ORDER — POTASSIUM CHLORIDE 10 MEQ/100ML IV SOLN
10.0000 meq | INTRAVENOUS | Status: AC
  Administered 2020-05-29 (×4): 10 meq via INTRAVENOUS

## 2020-05-29 MED ORDER — POTASSIUM CHLORIDE 10 MEQ/100ML IV SOLN
INTRAVENOUS | Status: AC
  Filled 2020-05-29: qty 400

## 2020-05-29 MED ORDER — OCTREOTIDE ACETATE 30 MG IM KIT
30.0000 mg | PACK | Freq: Once | INTRAMUSCULAR | Status: AC
  Administered 2020-05-29: 30 mg via INTRAMUSCULAR

## 2020-05-29 MED ORDER — OCTREOTIDE ACETATE 30 MG IM KIT
PACK | INTRAMUSCULAR | Status: AC
  Filled 2020-05-29: qty 1

## 2020-05-29 NOTE — Patient Instructions (Signed)
Hypokalemia Hypokalemia means that the amount of potassium in the blood is lower than normal. Potassium is a chemical (electrolyte) that helps regulate the amount of fluid in the body. It also stimulates muscle tightening (contraction) and helps nerves work properly. Normally, most of the body's potassium is inside cells, and only a very small amount is in the blood. Because the amount in the blood is so small, minor changes to potassium levels in the blood can be life-threatening. What are the causes? This condition may be caused by:  Antibiotic medicine.  Diarrhea or vomiting. Taking too much of a medicine that helps you have a bowel movement (laxative) can cause diarrhea and lead to hypokalemia.  Chronic kidney disease (CKD).  Medicines that help the body get rid of excess fluid (diuretics).  Eating disorders, such as bulimia.  Low magnesium levels in the body.  Sweating a lot. What are the signs or symptoms? Symptoms of this condition include:  Weakness.  Constipation.  Fatigue.  Muscle cramps.  Mental confusion.  Skipped heartbeats or irregular heartbeat (palpitations).  Tingling or numbness. How is this diagnosed? This condition is diagnosed with a blood test. How is this treated? This condition may be treated by:  Taking potassium supplements by mouth.  Adjusting the medicines that you take.  Eating more foods that contain a lot of potassium. If your potassium level is very low, you may need to get potassium through an IV and be monitored in the hospital. Follow these instructions at home:   Take over-the-counter and prescription medicines only as told by your health care provider. This includes vitamins and supplements.  Eat a healthy diet. A healthy diet includes fresh fruits and vegetables, whole grains, healthy fats, and lean proteins.  If instructed, eat more foods that contain a lot of potassium. This includes: ? Nuts, such as peanuts and  pistachios. ? Seeds, such as sunflower seeds and pumpkin seeds. ? Peas, lentils, and lima beans. ? Whole grain and bran cereals and breads. ? Fresh fruits and vegetables, such as apricots, avocado, bananas, cantaloupe, kiwi, oranges, tomatoes, asparagus, and potatoes. ? Orange juice. ? Tomato juice. ? Red meats. ? Yogurt.  Keep all follow-up visits as told by your health care provider. This is important. Contact a health care provider if you:  Have weakness that gets worse.  Feel your heart pounding or racing.  Vomit.  Have diarrhea.  Have diabetes (diabetes mellitus) and you have trouble keeping your blood sugar (glucose) in your target range. Get help right away if you:  Have chest pain.  Have shortness of breath.  Have vomiting or diarrhea that lasts for more than 2 days.  Faint. Summary  Hypokalemia means that the amount of potassium in the blood is lower than normal.  This condition is diagnosed with a blood test.  Hypokalemia may be treated by taking potassium supplements, adjusting the medicines that you take, or eating more foods that are high in potassium.  If your potassium level is very low, you may need to get potassium through an IV and be monitored in the hospital. This information is not intended to replace advice given to you by your health care provider. Make sure you discuss any questions you have with your health care provider. Document Revised: 01/18/2018 Document Reviewed: 01/18/2018 Elsevier Patient Education  [place]. Dehydration, Adult Dehydration is a condition in which there is not enough water or other fluids in the body. This happens when a person loses more fluids than  he or she takes in. Important organs, such as the kidneys, brain, and heart, cannot function without a proper amount of fluids. Any loss of fluids from the body can lead to dehydration. Dehydration can be mild, moderate, or severe. It should be treated right away to  prevent it from becoming severe. What are the causes? Dehydration may be caused by:  Conditions that cause loss of water or other fluids, such as diarrhea, vomiting, or sweating or urinating a lot.  Not drinking enough fluids, especially when you are ill or doing activities that require a lot of energy.  Other illnesses and conditions, such as fever or infection.  Certain medicines, such as medicines that remove excess fluid from the body (diuretics).  Lack of safe drinking water.  Not being able to get enough water and food. What increases the risk? The following factors may make you more likely to develop this condition:  Having a long-term (chronic) illness that has not been treated properly, such as diabetes, heart disease, or kidney disease.  Being 39 years of age or older.  Having a disability.  Living in a place that is high in altitude, where thinner, drier air causes more fluid loss.  Doing exercises that put stress on your body for a long time (endurance sports). What are the signs or symptoms? Symptoms of dehydration depend on how severe it is. Mild or moderate dehydration  Thirst.  Dry lips or dry mouth.  Dizziness or light-headedness, especially when standing up from a seated position.  Muscle cramps.  Dark urine. Urine may be the color of tea.  Less urine or tears produced than usual.  Headache. Severe dehydration  Changes in skin. Your skin may be cold and clammy, blotchy, or pale. Your skin also may not return to normal after being lightly pinched and released.  Little or no tears, urine, or sweat.  Changes in vital signs, such as rapid breathing and low blood pressure. Your pulse may be weak or may be faster than 100 beats a minute when you are sitting still.  Other changes, such as: ? Feeling very thirsty. ? Sunken eyes. ? Cold hands and feet. ? Confusion. ? Being very tired (lethargic) or having trouble waking from sleep. ? Short-term  weight loss. ? Loss of consciousness. How is this diagnosed? This condition is diagnosed based on your symptoms and a physical exam. You may have blood and urine tests to help confirm the diagnosis. How is this treated? Treatment for this condition depends on how severe it is. Treatment should be started right away. Do not wait until dehydration becomes severe. Severe dehydration is an emergency and needs to be treated in a hospital.  Mild or moderate dehydration can be treated at home. You may be asked to: ? Drink more fluids. ? Drink an oral rehydration solution (ORS). This drink helps restore proper amounts of fluids and salts and minerals in the blood (electrolytes).  Severe dehydration can be treated: ? With IV fluids. ? By correcting abnormal levels of electrolytes. This is often done by giving electrolytes through a tube that is passed through your nose and into your stomach (nasogastric tube, or NG tube). ? By treating the underlying cause of dehydration. Follow these instructions at home: Oral rehydration solution If told by your health care provider, drink an ORS:  Make an ORS by following instructions on the package.  Start by drinking small amounts, about  cup (120 mL) every 5-10 minutes.  Slowly increase how much  you drink until you have taken the amount recommended by your health care provider. Eating and drinking         Drink enough clear fluid to keep your urine pale yellow. If you were told to drink an ORS, finish the ORS first and then start slowly drinking other clear fluids. Drink fluids such as: ? Water. Do not drink only water. Doing that can lead to hyponatremia, which is having too little salt (sodium) in the body. ? Water from ice chips you suck on. ? Fruit juice that you have added water to (diluted fruit juice). ? Low-calorie sports drinks.  Eat foods that contain a healthy balance of electrolytes, such as bananas, oranges, potatoes, tomatoes, and  spinach.  Do not drink alcohol.  Avoid the following: ? Drinks that contain a lot of sugar. These include high-calorie sports drinks, fruit juice that is not diluted, and soda. ? Caffeine. ? Foods that are greasy or contain a lot of fat or sugar. General instructions  Take over-the-counter and prescription medicines only as told by your health care provider.  Do not take sodium tablets. Doing that can lead to having too much sodium in the body (hypernatremia).  Return to your normal activities as told by your health care provider. Ask your health care provider what activities are safe for you.  Keep all follow-up visits as told by your health care provider. This is important. Contact a health care provider if:  You have muscle cramps, pain, or discomfort, such as: ? Pain in your abdomen and the pain gets worse or stays in one area (localizes). ? Stiff neck.  You have a rash.  You are more irritable than usual.  You are sleepier or have a harder time waking than usual.  You feel weak or dizzy.  You feel very thirsty. Get help right away if you have:  Any symptoms of severe dehydration.  Symptoms of vomiting, such as: ? You cannot eat or drink without vomiting. ? Vomiting gets worse or does not go away. ? Vomit includes blood or green matter (bile).  Symptoms that get worse with treatment.  A fever.  A severe headache.  Problems with urination or bowel movements, such as: ? Diarrhea that gets worse or does not go away. ? Blood in your stool (feces). This may cause stool to look black and tarry. ? Not urinating, or urinating only a small amount of very dark urine, within 6-8 hours.  Trouble breathing. These symptoms may represent a serious problem that is an emergency. Do not wait to see if the symptoms will go away. Get medical help right away. Call your local emergency services (911 in the [place] Do not drive yourself to the hospital. Summary  Dehydration is a  condition in which there is not enough water or other fluids in the body. This happens when a person loses more fluids than he or she takes in.  Treatment for this condition depends on how severe it is. Treatment should be started right away. Do not wait until dehydration becomes severe.  Drink enough clear fluid to keep your urine pale yellow. If you were told to drink an oral rehydration solution (ORS), finish the ORS first and then start slowly drinking other clear fluids.  Take over-the-counter and prescription medicines only as told by your health care provider.  Get help right away if you have any symptoms of severe dehydration. This information is not intended to replace advice given to you by your  health care provider. Make sure you discuss any questions you have with your health care provider. Document Revised: 01/18/2019 Document Reviewed: 01/18/2019 Elsevier Patient Education  [place]. Octreotide injection solution What is this medicine? OCTREOTIDE (ok TREE oh tide) is used to reduce blood levels of growth hormone in patients with a condition called acromegaly. This medicine also reduces flushing and watery diarrhea caused by certain types of cancer. This medicine may be used for other purposes; ask your health care provider or pharmacist if you have questions. COMMON BRAND NAME(S): Leatha Gilding, Sandostatin What should I tell my health care provider before I take this medicine? They need to know if you have any of these conditions:  diabetes  gallbladder disease  kidney disease  liver disease  thyroid disease  an unusual or allergic reaction to octreotide, other medicines, foods, dyes, or preservatives  pregnant or trying to get pregnant  breast-feeding How should I use this medicine? This medicine is for injection under the skin or into a vein (only in emergency situations). It is usually given by a health care professional in a hospital or clinic setting. If  you get this medicine at home, you will be taught how to prepare and give this medicine. Allow the injection solution to come to room temperature before use. Do not warm it artificially. Use exactly as directed. Take your medicine at regular intervals. Do not take your medicine more often than directed. It is important that you put your used needles and syringes in a special sharps container. Do not put them in a trash can. If you do not have a sharps container, call your pharmacist or healthcare provider to get one. Talk to your pediatrician regarding the use of this medicine in children. Special care may be needed. Overdosage: If you think you have taken too much of this medicine contact a poison control center or emergency room at once. NOTE: This medicine is only for you. Do not share this medicine with others. What if I miss a dose? If you miss a dose, take it as soon as you can. If it is almost time for your next dose, take only that dose. Do not take double or extra doses. What may interact with this medicine?  bromocriptine  certain medicines for blood pressure, heart disease, irregular heartbeat  cyclosporine  diuretics  medicines for diabetes, including insulin  quinidine This list may not describe all possible interactions. Give your health care provider a list of all the medicines, herbs, non-prescription drugs, or dietary supplements you use. Also tell them if you smoke, drink alcohol, or use illegal drugs. Some items may interact with your medicine. What should I watch for while using this medicine? Visit your doctor or health care professional for regular checks on your progress. To help reduce irritation at the injection site, use a different site for each injection and make sure the solution is at room temperature before use. This medicine may cause decreases in blood sugar. Signs of low blood sugar include chills, cool, pale skin or cold sweats, drowsiness, extreme hunger,  fast heartbeat, headache, nausea, nervousness or anxiety, shakiness, trembling, unsteadiness, tiredness, or weakness. Contact your doctor or health care professional right away if you experience any of these symptoms. This medicine may increase blood sugar. Ask your healthcare provider if changes in diet or medicines are needed if you have diabetes. This medicine may cause a decrease in vitamin B12. You should make sure that you get enough vitamin B12 while you  are taking this medicine. Discuss the foods you eat and the vitamins you take with your health care professional. What side effects may I notice from receiving this medicine? Side effects that you should report to your doctor or health care professional as soon as possible:  allergic reactions like skin rash, itching or hives, swelling of the face, lips, or tongue  fast, slow, or irregular heartbeat  right upper belly pain  severe stomach pain  signs and symptoms of high blood sugar such as being more thirsty or hungry or having to urinate more than normal. You may also feel very tired or have blurry vision.  signs and symptoms of low blood sugar such as feeling anxious; confusion; dizziness; increased hunger; unusually weak or tired; increased sweating; shakiness; cold, clammy skin; irritable; headache; blurred vision; fast heartbeat; loss of consciousness  unusually weak or tired Side effects that usually do not require medical attention (report to your doctor or health care professional if they continue or are bothersome):  diarrhea  dizziness  gas  headache  nausea, vomiting  pain, redness, or irritation at site where injected  upset stomach This list may not describe all possible side effects. Call your doctor for medical advice about side effects. You may report side effects to FDA at 1-800-FDA-1088. Where should I keep my medicine? Keep out of the reach of children. Store in a refrigerator between 2 and 8 degrees C  (36 and 46 degrees F). Protect from light. Allow to come to room temperature naturally. Do not use artificial heat. If protected from light, the injection may be stored at room temperature between 20 and 30 degrees C (70 and 86 degrees F) for 14 days. After the initial use, throw away any unused portion of a multiple dose vial after 14 days. Throw away unused portions of the ampules after use. NOTE: This sheet is a summary. It may not cover all possible information. If you have questions about this medicine, talk to your doctor, pharmacist, or health care provider.  2020 Elsevier/Gold Standard (2019-01-04 13:33:09)

## 2020-05-29 NOTE — Progress Notes (Signed)
PT STABLE AT TIME OF DISCHARGE 

## 2020-05-29 NOTE — Telephone Encounter (Signed)
Per 12/9 sched note appts schedule and given to patient

## 2020-05-30 ENCOUNTER — Inpatient Hospital Stay: Payer: PPO

## 2020-06-09 ENCOUNTER — Encounter: Payer: Self-pay | Admitting: Oncology

## 2020-06-19 NOTE — Progress Notes (Signed)
PT STABLE AT TIME OF DISCHARGE 

## 2020-06-25 ENCOUNTER — Other Ambulatory Visit: Payer: Self-pay | Admitting: Pharmacist

## 2020-06-27 ENCOUNTER — Encounter: Payer: Self-pay | Admitting: Oncology

## 2020-06-30 ENCOUNTER — Encounter: Payer: Self-pay | Admitting: Oncology

## 2020-06-30 DIAGNOSIS — C50919 Malignant neoplasm of unspecified site of unspecified female breast: Secondary | ICD-10-CM | POA: Diagnosis not present

## 2020-06-30 DIAGNOSIS — J984 Other disorders of lung: Secondary | ICD-10-CM | POA: Diagnosis not present

## 2020-06-30 DIAGNOSIS — K760 Fatty (change of) liver, not elsewhere classified: Secondary | ICD-10-CM | POA: Diagnosis not present

## 2020-06-30 DIAGNOSIS — C785 Secondary malignant neoplasm of large intestine and rectum: Secondary | ICD-10-CM | POA: Diagnosis not present

## 2020-06-30 DIAGNOSIS — C773 Secondary and unspecified malignant neoplasm of axilla and upper limb lymph nodes: Secondary | ICD-10-CM | POA: Diagnosis not present

## 2020-07-01 ENCOUNTER — Other Ambulatory Visit: Payer: Self-pay

## 2020-07-01 ENCOUNTER — Inpatient Hospital Stay: Payer: PPO

## 2020-07-01 ENCOUNTER — Telehealth: Payer: Self-pay | Admitting: Hematology and Oncology

## 2020-07-01 ENCOUNTER — Other Ambulatory Visit: Payer: Self-pay | Admitting: Pharmacist

## 2020-07-01 ENCOUNTER — Inpatient Hospital Stay: Payer: PPO | Attending: Oncology | Admitting: Hematology and Oncology

## 2020-07-01 VITALS — BP 137/70 | HR 89 | Temp 98.0°F | Resp 16 | Ht 65.5 in | Wt 125.7 lb

## 2020-07-01 DIAGNOSIS — C50111 Malignant neoplasm of central portion of right female breast: Secondary | ICD-10-CM

## 2020-07-01 DIAGNOSIS — Z170 Estrogen receptor positive status [ER+]: Secondary | ICD-10-CM | POA: Diagnosis not present

## 2020-07-01 DIAGNOSIS — C50919 Malignant neoplasm of unspecified site of unspecified female breast: Secondary | ICD-10-CM | POA: Insufficient documentation

## 2020-07-01 DIAGNOSIS — R197 Diarrhea, unspecified: Secondary | ICD-10-CM | POA: Insufficient documentation

## 2020-07-01 DIAGNOSIS — D649 Anemia, unspecified: Secondary | ICD-10-CM | POA: Diagnosis not present

## 2020-07-01 DIAGNOSIS — C7889 Secondary malignant neoplasm of other digestive organs: Secondary | ICD-10-CM | POA: Insufficient documentation

## 2020-07-01 LAB — CBC AND DIFFERENTIAL
HCT: 31 — AB (ref 36–46)
Hemoglobin: 9.9 — AB (ref 12.0–16.0)
Neutrophils Absolute: 2.65
Platelets: 258 (ref 150–399)
WBC: 5

## 2020-07-01 LAB — COMPREHENSIVE METABOLIC PANEL
Albumin: 3.7 (ref 3.5–5.0)
Calcium: 7.1 — AB (ref 8.7–10.7)

## 2020-07-01 LAB — HEPATIC FUNCTION PANEL
ALT: 28 (ref 7–35)
AST: 43 — AB (ref 13–35)
Alkaline Phosphatase: 115 (ref 25–125)
Bilirubin, Total: 0.5

## 2020-07-01 LAB — BASIC METABOLIC PANEL
BUN: 19 (ref 4–21)
CO2: 19 (ref 13–22)
Chloride: 114 — AB (ref 99–108)
Creatinine: 1.2 — AB (ref 0.5–1.1)
Glucose: 101
Potassium: 3.3 — AB (ref 3.4–5.3)
Sodium: 141 (ref 137–147)

## 2020-07-01 LAB — CBC: RBC: 3.53 — AB (ref 3.87–5.11)

## 2020-07-01 NOTE — Progress Notes (Signed)
[facility]  [address] [place],  [place]  [address] [phone]  Clinic Day:  05/29/2020  Referring physician: Greig Right, MD   HISTORY OF PRESENT ILLNESS:  The patient is a 60 y.o. female with metastatic hormone positive breast cancer, which includes spread of disease to her stomach, which was proven per an EGD with biopsy.  Recent CT scans showed no evidence of metastatic disease.  However, a recent EGD with biopsy still showed cancer within her stomach lining.   She is still taking everolimus/exemestane.  Outside of pruritus, she is tolerating this regimen very well.  Unfortunately, from a GI standpoint, her diarrhea remains prominent despite receiving cholestyramine and Sandostatin.  The concern is the cancer in her stomach is preventing adequate nutrient absorption.  Her husband is understandably concerned as she continues to lose a significant amount of weight.   She had CT imaging on 06-30-2020 which reveals no recurrent or metastatic disease in the chest, abdomen or pelvis.   Today she states she is improving. She is using her zofran and able to eat more. She states she is about 50% back to normal; however, 100% improved from last visit. She has gained 0.5 pound since last visit. She continues to complain of itching. She denies fever, chills, nausea or vomiting. She denies shortness of breath, chest pain or cough. CBC today is unremarkable other than hemoglobin 9.9 decreased from 10.9 at last visit. CMP reveals potassium 3.3, calcium 7.1 and magnesium 1.0.    PHYSICAL EXAM:  Blood pressure 137/70, pulse 89, temperature 98 F (36.7 C), temperature source Oral, resp. rate 16, height 5' 5.5" (1.664 m), weight 125 lb 11.2 oz (57 kg), SpO2 99 %. Wt Readings from Last 3 Encounters:  07/01/20 125 lb 11.2 oz (57 kg)  04/17/20 135 lb 7 oz (61.4 kg)  05/29/20 125 lb 8 oz (56.9 kg)   Body mass index is 20.6 kg/m. Performance status (ECOG):  1 Physical ExamLABS:   Ref. Range 05/29/2020 00:00  Sodium Latest Ref Range: 137 - 147  141  Potassium Latest Ref Range: 3.4 - 5.3  3.3 (A)  Chloride Latest Ref Range: 99 - 108  114 (A)  CO2 Latest Ref Range: 13 - 22  19  Glucose Unknown 101  BUN Latest Ref Range: 4 - 21  19 (A)  Creatinine Latest Ref Range: 0.5 - 1.1  1.2 (A)  Calcium Latest Ref Range: 8.7 - 10.7  7.1 (A)  Alkaline Phosphatase Latest Ref Range: 25 - 125  115 (A)  Albumin Latest Ref Range: 3.5 - 5.0  3.7  AST Latest Ref Range: 13 - 35  43 (A)  ALT Latest Ref Range: 7 - 35  28 (A)  Bilirubin, Total Unknown 0.5                  WBC Unknown 5.0  RBC Latest Ref Range: 3.87 - 5.11  3.53 (A)  Hemoglobin Latest Ref Range: 12.0 - 16.0  9.9 (A)  HCT Latest Ref Range: 36 - 46  30.6 (A)  MCV Latest Ref Range: 76 - 111  87  Platelets Latest Ref Range: 150 - 399  258   Exam(s): [id number] CT/CT CHEST-ABD-PELV W/IV CM CLINICAL DATA:  History of right breast cancer, metastatic to colon, status post mastectomy and right colectomy  EXAM: CT CHEST, ABDOMEN, AND PELVIS WITH CONTRAST  TECHNIQUE: Multidetector CT imaging of the chest, abdomen and pelvis was performed following the standard protocol during bolus  administration of intravenous contrast.  CONTRAST:  100 mL Isovue 370 iodinated contrast IV, additional oral enteric contrast  COMPARISON:  04/25/2020  FINDINGS: CT CHEST FINDINGS  Cardiovascular: No significant vascular findings. Normal heart size. No pericardial effusion.  Mediastinum/Nodes: No enlarged mediastinal, hilar, or axillary lymph nodes. Thyroid gland, trachea, and esophagus demonstrate no significant findings.  Lungs/Pleura: There is minimal subpleural radiation fibrosis of the anterior right lung and right pulmonary apex (series 4, image 15, 56). No pleural effusion or pneumothorax.  Musculoskeletal: No chest wall mass or suspicious bone lesions identified. Status post right mastectomy  CT  ABDOMEN PELVIS FINDINGS  Hepatobiliary: No solid liver abnormality is seen. Hepatic steatosis. Status post cholecystectomy. No biliary ductal dilatation.  Pancreas: Unremarkable. No pancreatic ductal dilatation or surrounding inflammatory changes.  Spleen: Normal in size without significant abnormality.  Adrenals/Urinary Tract: Adrenal glands are unremarkable. Kidneys are normal, without renal calculi, solid lesion, or hydronephrosis. Bladder is unremarkable.  Stomach/Bowel: Stomach is within normal limits. Redemonstrated postoperative findings of partial right hemicolectomy and reanastomosis. Mildly distended loops of small bowel in the low and right abdomen, unchanged in appearance or configuration compared to prior examination  Vascular/Lymphatic: Aortic atherosclerosis. No enlarged abdominal or pelvic lymph nodes.  Reproductive: Status post hysterectomy.  Other: No abdominal wall hernia or abnormality. Trace, nonspecific fluid in the low pelvis, unchanged compared to prior examination (series 602, image 67).  Musculoskeletal: No acute or significant osseous findings.  IMPRESSION: 1. Redemonstrated postoperative findings of right mastectomy and partial right hemicolectomy and reanastomosis. 2. No evidence of recurrent or metastatic disease in the chest, abdomen, or pelvis. 3. Trace, nonspecific fluid in the low pelvis, unchanged compared to prior examination. 4. Hepatic steatosis.  Aortic Atherosclerosis (ICD10-I70.0).   Electronically Signed   By: Eddie Candle M.D.   On: 06/30/2020 09:56  Electronically Signed By: Delanna Ahmadi MD  Electronically Signed Date/Time: 01/10/220959 Dictate Date/Time: 06/30/20 0939    ASSESSMENT & PLAN:  Assessment/Plan:  A 60 y.o. female with metastatic hormone positive breast cancer, including spread of disease to her stomach. CT imaging reveals no metastatic or recurrent disease. I will review where we are with having her tumor  sent to [facility] for additional testing as discussed at prior visit.  Her nausea has improved with ondansetron. Her weight is stable. She will continue to receive octreotide to help with her diarrhea.  She knows to also increase the fiber in her diet.  She will continue taking everolimus/exemestane for her disease manaaement. She still has hydroxyzine, which she can take 50-100 q6h prn pruritus.We will replace potassium, calcium and magnesium IV this week when she goes in for octreotide injection. We will see her back in 4 weeks for repeat CBC, CMP and magnesium. Both her and her husband verbalize understanding of and agreement to the plans discussed today. They know to call the office should any new questions or concerns arise.    Melodye Ped, NP

## 2020-07-01 NOTE — Telephone Encounter (Signed)
Per 1/11 LOS, patient scheduled for Feb 2022 Appt's.  Per 1/11 Staff Msg, scheduled patient Additional Infusion Potassium, Magensium, Calcium to already scheduled Sandostatin Injection on 1/14.  Informed patient to be at the [facility] Location at 9:00 am

## 2020-07-02 ENCOUNTER — Other Ambulatory Visit: Payer: Self-pay | Admitting: Hematology and Oncology

## 2020-07-02 ENCOUNTER — Telehealth: Payer: Self-pay

## 2020-07-02 MED ORDER — HYDROCORTISONE 0.5 % EX CREA: 1.0000 "application " | TOPICAL_CREAM | Freq: Two times a day (BID) | CUTANEOUS | 0 refills | Status: DC

## 2020-07-02 NOTE — Telephone Encounter (Signed)
Pt states that Melissa,NP, was going to send a prescription to [facility]. It was to be an itching cream. [facility] hasn't received it. [phone]

## 2020-07-03 MED FILL — Calcium Gluconate Inj 10%: INTRAVENOUS | Qty: 20 | Status: AC

## 2020-07-04 ENCOUNTER — Other Ambulatory Visit: Payer: Self-pay

## 2020-07-04 ENCOUNTER — Inpatient Hospital Stay: Payer: PPO

## 2020-07-04 ENCOUNTER — Other Ambulatory Visit: Payer: Self-pay | Admitting: Hematology and Oncology

## 2020-07-04 VITALS — BP 124/65 | HR 89 | Temp 97.8°F | Resp 18 | Ht 65.5 in | Wt 128.5 lb

## 2020-07-04 DIAGNOSIS — R197 Diarrhea, unspecified: Secondary | ICD-10-CM

## 2020-07-04 DIAGNOSIS — Z170 Estrogen receptor positive status [ER+]: Secondary | ICD-10-CM | POA: Diagnosis not present

## 2020-07-04 DIAGNOSIS — C50919 Malignant neoplasm of unspecified site of unspecified female breast: Secondary | ICD-10-CM | POA: Diagnosis not present

## 2020-07-04 DIAGNOSIS — C7889 Secondary malignant neoplasm of other digestive organs: Secondary | ICD-10-CM | POA: Diagnosis not present

## 2020-07-04 LAB — MAGNESIUM: Magnesium: 1

## 2020-07-04 MED ORDER — SODIUM CHLORIDE 0.9 % IV SOLN
Freq: Once | INTRAVENOUS | Status: AC
  Filled 2020-07-04: qty 250

## 2020-07-04 MED ORDER — MAGNESIUM SULFATE 4 GM/100ML IV SOLN
INTRAVENOUS | Status: AC
  Filled 2020-07-04: qty 100

## 2020-07-04 MED ORDER — POTASSIUM CHLORIDE 10 MEQ/100ML IV SOLN
10.0000 meq | INTRAVENOUS | Status: AC
  Administered 2020-07-04 (×2): 10 meq via INTRAVENOUS

## 2020-07-04 MED ORDER — OCTREOTIDE ACETATE 30 MG IM KIT
PACK | INTRAMUSCULAR | Status: AC
  Filled 2020-07-04: qty 1

## 2020-07-04 MED ORDER — MAGNESIUM SULFATE 4 GM/100ML IV SOLN
4.0000 g | Freq: Once | INTRAVENOUS | Status: AC
  Administered 2020-07-04: 4 g via INTRAVENOUS

## 2020-07-04 MED ORDER — OCTREOTIDE ACETATE 30 MG IM KIT
30.0000 mg | PACK | Freq: Once | INTRAMUSCULAR | Status: AC
  Administered 2020-07-04: 30 mg via INTRAMUSCULAR

## 2020-07-04 MED ORDER — SODIUM CHLORIDE 0.9 % IV SOLN
2.0000 g | Freq: Once | INTRAVENOUS | Status: AC
  Administered 2020-07-04: 2 g via INTRAVENOUS
  Filled 2020-07-04: qty 20

## 2020-07-04 MED ORDER — POTASSIUM CHLORIDE 10 MEQ/100ML IV SOLN
INTRAVENOUS | Status: AC
  Filled 2020-07-04: qty 200

## 2020-07-04 NOTE — Addendum Note (Signed)
Addended by: Juanetta Beets on: 07/04/2020 09:23 AM   Modules accepted: Orders

## 2020-07-04 NOTE — Progress Notes (Signed)
1356:PT STABLE AT TIME OF DISCHARGE ?

## 2020-07-04 NOTE — Addendum Note (Signed)
Addended by: Juanetta Beets on: 07/04/2020 09:39 AM   Modules accepted: Orders

## 2020-07-04 NOTE — Patient Instructions (Signed)
Hypocalcemia, Adult Hypocalcemia is when the level of calcium in a person's blood is below normal. Calcium is a mineral that is used by the body in many ways. Not having enough blood calcium can affect the nervous system. This can lead to problems with muscles, the heart, and the brain. What are the causes? This condition may be caused by:  A deficiency of vitamin D or magnesium or both.  Decreased levels of parathyroid hormone (hypoparathyroidism).  Kidney function problems.  Low levels of a body protein called albumin.  Inflammation of the pancreas (pancreatitis).  Not taking in enough vitamins and minerals in the diet or having intestinal problems that interfere with nutrient absorption.  Certain medicines. What are the signs or symptoms? Some people may not have any symptoms, especially if they have long-term (chronic) hypocalcemia. Symptoms of this condition may include:  Numbness and tingling in the fingers, toes, or around the mouth.  Muscle twitching, aches, or cramps, especially in the legs, feet, and back.  Spasm of the voice box (laryngospasm). This may make it difficult to breath or speak.  Fast heartbeats (palpitations) and abnormal heart rhythms (arrhythmias).  Shaking uncontrollably (seizures).  Memory problems, confusion, or difficulty thinking.  Depression, anxiety, irritability, or changes in personality. Long-term symptoms of this condition may include:  Coarse, brittle hair and nails.  Dry skin or lasting skin diseases (psoriasis, eczema, or dermatitis).  Dental cavities.  Clouding of the eye lens (cataracts). How is this diagnosed? This condition is usually diagnosed with a blood test. You may also have other tests to help determine the underlying cause of the condition. This may include more blood tests and imaging tests.   How is this treated? This condition may be treated with:  Calcium given by mouth (orally) or given through an IV. The method  used for giving calcium will depend on the severity of the condition. If your condition is severe, you may need to be closely monitored in the hospital.  Giving other minerals (electrolytes), such as magnesium. Other treatment will depend on the cause of the condition. Follow these instructions at home:  Follow diet instructions from your health care provider or dietitian.  Take supplements only as told by your health care provider.  Keep all follow-up visits as told by your health care provider. This is important. Contact a health care provider if you:  Have increased muscle twitching or cramps.  Have new swelling in the feet, ankles, or legs.  Develop changes in mood, memory, or personality. Get help right away if you:  Have chest pain.  Have persistent rapid or irregular heartbeats.  Have difficulty breathing.  Faint.  Start to have seizures.  Have confusion. Summary  Hypocalcemia is when the level of calcium in a person's blood is below normal. Not having enough blood calcium can affect the nervous system. This can lead to problems with muscles, the heart, and the brain.  This condition may be treated with calcium given by mouth or through an IV, taking other minerals, and treating the underlying cause of hypocalcemia.  Take supplements only as told by your health care provider.  Contact a health care provider if you have new or worsening symptoms.  Keep all follow-up visits as told by your health care provider. This is important. This information is not intended to replace advice given to you by your health care provider. Make sure you discuss any questions you have with your health care provider. Document Revised: 06/16/2018 Document Reviewed: 06/16/2018 Elsevier   Patient Education  2021 Elsevier Inc. Hypokalemia Hypokalemia means that the amount of potassium in the blood is lower than normal. Potassium is a chemical (electrolyte) that helps regulate the amount of  fluid in the body. It also stimulates muscle tightening (contraction) and helps nerves work properly. Normally, most of the body's potassium is inside cells, and only a very small amount is in the blood. Because the amount in the blood is so small, minor changes to potassium levels in the blood can be life-threatening. What are the causes? This condition may be caused by:  Antibiotic medicine.  Diarrhea or vomiting. Taking too much of a medicine that helps you have a bowel movement (laxative) can cause diarrhea and lead to hypokalemia.  Chronic kidney disease (CKD).  Medicines that help the body get rid of excess fluid (diuretics).  Eating disorders, such as bulimia.  Low magnesium levels in the body.  Sweating a lot. What are the signs or symptoms? Symptoms of this condition include:  Weakness.  Constipation.  Fatigue.  Muscle cramps.  Mental confusion.  Skipped heartbeats or irregular heartbeat (palpitations).  Tingling or numbness. How is this diagnosed? This condition is diagnosed with a blood test. How is this treated? This condition may be treated by:  Taking potassium supplements by mouth.  Adjusting the medicines that you take.  Eating more foods that contain a lot of potassium. If your potassium level is very low, you may need to get potassium through an IV and be monitored in the hospital. Follow these instructions at home:  Take over-the-counter and prescription medicines only as told by your health care provider. This includes vitamins and supplements.  Eat a healthy diet. A healthy diet includes fresh fruits and vegetables, whole grains, healthy fats, and lean proteins.  If instructed, eat more foods that contain a lot of potassium. This includes: ? Nuts, such as peanuts and pistachios. ? Seeds, such as sunflower seeds and pumpkin seeds. ? Peas, lentils, and lima beans. ? Whole grain and bran cereals and breads. ? Fresh fruits and vegetables, such  as apricots, avocado, bananas, cantaloupe, kiwi, oranges, tomatoes, asparagus, and potatoes. ? Orange juice. ? Tomato juice. ? Red meats. ? Yogurt.  Keep all follow-up visits as told by your health care provider. This is important.   Contact a health care provider if you:  Have weakness that gets worse.  Feel your heart pounding or racing.  Vomit.  Have diarrhea.  Have diabetes (diabetes mellitus) and you have trouble keeping your blood sugar (glucose) in your target range. Get help right away if you:  Have chest pain.  Have shortness of breath.  Have vomiting or diarrhea that lasts for more than 2 days.  Faint. Summary  Hypokalemia means that the amount of potassium in the blood is lower than normal.  This condition is diagnosed with a blood test.  Hypokalemia may be treated by taking potassium supplements, adjusting the medicines that you take, or eating more foods that are high in potassium.  If your potassium level is very low, you may need to get potassium through an IV and be monitored in the hospital. This information is not intended to replace advice given to you by your health care provider. Make sure you discuss any questions you have with your health care provider. Document Revised: 01/18/2018 Document Reviewed: 01/18/2018 Elsevier Patient Education  2021 Elsevier Inc.  

## 2020-07-28 NOTE — Progress Notes (Signed)
[facility]  [address] [place],  [place]  [address] [phone]  Clinic Day:  05/29/2020  Referring physician: Greig Right, MD   HISTORY OF PRESENT ILLNESS:  The patient is a 60 y.o. female with metastatic hormone positive breast cancer, which includes spread of disease to her stomach, which was proven per an EGD with biopsy.  Recent CT scans showed no radiographic evidence of progression of her metasta disease.  She is still taking everolimus/exemestane for her disease control.   She comes in today for routine follow-up.  Since her last visit, she has been doing well.  Her main concern currently is swelling in her legs.  From a GI standpoint, her diarrhea remains prominent, but she claims her Sandostatin helps to a certain degree.  Despite her stomach having disease within it, she claims her appetite and digestion are improving.  PHYSICAL EXAM:  Blood pressure 127/74, pulse 76, temperature 97.8 F (36.6 C), resp. rate 16, height 5' 5.5" (1.664 m), weight 131 lb (59.4 kg), SpO2 100 %. Wt Readings from Last 3 Encounters:  07/29/20 131 lb (59.4 kg)  07/04/20 128 lb 8 oz (58.3 kg)  07/01/20 125 lb 11.2 oz (57 kg)   Body mass index is 21.47 kg/m. Performance status (ECOG): 1 Physical Exam Constitutional:      Appearance: Normal appearance. She is not ill-appearing.  HENT:     Mouth/Throat:     Mouth: Mucous membranes are moist.     Pharynx: Oropharynx is clear. No oropharyngeal exudate or posterior oropharyngeal erythema.  Cardiovascular:     Rate and Rhythm: Normal rate and regular rhythm.     Heart sounds: No murmur heard. No friction rub. No gallop.   Pulmonary:     Effort: Pulmonary effort is normal. No respiratory distress.     Breath sounds: Normal breath sounds. No wheezing, rhonchi or rales.  Chest:  Breasts:     Right: No axillary adenopathy or supraclavicular adenopathy.     Left: No axillary adenopathy or supraclavicular  adenopathy.    Abdominal:     General: Bowel sounds are normal. There is no distension.     Palpations: Abdomen is soft. There is no mass.     Tenderness: There is no abdominal tenderness.  Musculoskeletal:        General: No swelling.     Right lower leg: 1+ Edema present.     Left lower leg: 1+ Edema present.  Lymphadenopathy:     Cervical: No cervical adenopathy.     Upper Body:     Right upper body: No supraclavicular or axillary adenopathy.     Left upper body: No supraclavicular or axillary adenopathy.     Lower Body: No right inguinal adenopathy. No left inguinal adenopathy.  Skin:    General: Skin is warm.     Coloration: Skin is not jaundiced.     Findings: No lesion or rash.  Neurological:     General: No focal deficit present.     Mental Status: She is alert and oriented to person, place, and time. Mental status is at baseline.     Cranial Nerves: Cranial nerves are intact.  Psychiatric:        Mood and Affect: Mood normal.        Behavior: Behavior normal.        Thought Content: Thought content normal.   LABS: Results for KYLIYAH, STIRN (MRN [id number]) as of 07/29/2020 12:45  Ref. Range 07/29/2020  00:00  Sodium Latest Ref Range: 137 - 147  141  Potassium Latest Ref Range: 3.4 - 5.3  3.5  Chloride Latest Ref Range: 99 - 108  110 (A)  CO2 Latest Ref Range: 13 - 22  18  Glucose Unknown 153  BUN Latest Ref Range: 4 - 21  20  Creatinine Latest Ref Range: 0.5 - 1.1  1.0  Calcium Latest Ref Range: 8.7 - 10.7  7.2 (A)  Magnesium Latest Ref Range: 1.6 - 2.3  1.1 (A)  Alkaline Phosphatase Latest Ref Range: 25 - 125  100  Albumin Latest Ref Range: 3.5 - 5.0  3.9  AST Latest Ref Range: 13 - 35  37 (A)  ALT Latest Ref Range: 7 - 35  28  WBC Unknown 4.9  RBC Latest Ref Range: 3.87 - 5.11  3.3 (A)  Hemoglobin Latest Ref Range: 12.0 - 16.0  9.3 (A)  HCT Latest Ref Range: 36 - 46  30 (A)  MCV Latest Ref Range: 81 - 99  89  Platelets Latest Ref Range: 150 - 399  263   NEUT# Unknown 2.65    ASSESSMENT & PLAN:  Assessment/Plan:  A 60 y.o. female with metastatic hormone positive breast cancer, including spread of disease to her stomach.  She will continue taking everolimus/exemestane for her disease manaaement. She will also continue to receive Sandostatin monthly to minimize the severity of her diarrhea.  I will also prescribe her lasix 20 mg daily prn swelling.  Clinically, she appears to be holding her own.  I will see her back in 1 month for repeat clinical assessment.  The patient understands all the plans discussed today and is in agreement with them.  Nekesha Font Macarthur Critchley, MD

## 2020-07-29 ENCOUNTER — Inpatient Hospital Stay: Payer: PPO | Attending: Oncology

## 2020-07-29 ENCOUNTER — Other Ambulatory Visit: Payer: Self-pay | Admitting: Hematology and Oncology

## 2020-07-29 ENCOUNTER — Inpatient Hospital Stay: Payer: PPO | Admitting: Oncology

## 2020-07-29 ENCOUNTER — Other Ambulatory Visit: Payer: Self-pay

## 2020-07-29 ENCOUNTER — Other Ambulatory Visit: Payer: Self-pay | Admitting: Oncology

## 2020-07-29 ENCOUNTER — Telehealth: Payer: Self-pay | Admitting: Oncology

## 2020-07-29 VITALS — BP 127/74 | HR 76 | Temp 97.8°F | Resp 16 | Ht 65.5 in | Wt 131.0 lb

## 2020-07-29 DIAGNOSIS — C50111 Malignant neoplasm of central portion of right female breast: Secondary | ICD-10-CM

## 2020-07-29 DIAGNOSIS — Z170 Estrogen receptor positive status [ER+]: Secondary | ICD-10-CM | POA: Insufficient documentation

## 2020-07-29 DIAGNOSIS — R609 Edema, unspecified: Secondary | ICD-10-CM

## 2020-07-29 DIAGNOSIS — C7889 Secondary malignant neoplasm of other digestive organs: Secondary | ICD-10-CM | POA: Insufficient documentation

## 2020-07-29 DIAGNOSIS — D649 Anemia, unspecified: Secondary | ICD-10-CM | POA: Diagnosis not present

## 2020-07-29 DIAGNOSIS — C50919 Malignant neoplasm of unspecified site of unspecified female breast: Secondary | ICD-10-CM | POA: Insufficient documentation

## 2020-07-29 DIAGNOSIS — C785 Secondary malignant neoplasm of large intestine and rectum: Secondary | ICD-10-CM | POA: Diagnosis not present

## 2020-07-29 LAB — CBC
MCV: 89 (ref 81–99)
RBC: 3.3 — AB (ref 3.87–5.11)

## 2020-07-29 LAB — COMPREHENSIVE METABOLIC PANEL
Albumin: 3.9 (ref 3.5–5.0)
Calcium: 7.2 — AB (ref 8.7–10.7)

## 2020-07-29 LAB — BASIC METABOLIC PANEL
BUN: 20 (ref 4–21)
CO2: 18 (ref 13–22)
Chloride: 110 — AB (ref 99–108)
Creatinine: 1 (ref 0.5–1.1)
Glucose: 153
Potassium: 3.5 (ref 3.4–5.3)
Sodium: 141 (ref 137–147)

## 2020-07-29 LAB — MAGNESIUM: Magnesium: 1.1 — AB (ref 1.6–2.3)

## 2020-07-29 LAB — HEPATIC FUNCTION PANEL
ALT: 28 (ref 7–35)
AST: 37 — AB (ref 13–35)
Alkaline Phosphatase: 100 (ref 25–125)

## 2020-07-29 LAB — CBC AND DIFFERENTIAL
HCT: 30 — AB (ref 36–46)
Hemoglobin: 9.3 — AB (ref 12.0–16.0)
Neutrophils Absolute: 2.65
Platelets: 263 (ref 150–399)
WBC: 4.9

## 2020-07-29 MED ORDER — FUROSEMIDE 20 MG PO TABS: 20.0000 mg | ORAL_TABLET | Freq: Once | ORAL | 1 refills | Status: DC | PRN

## 2020-07-29 NOTE — Telephone Encounter (Signed)
Per 2/8 los next appt sched and given to patient 

## 2020-08-01 ENCOUNTER — Other Ambulatory Visit: Payer: Self-pay

## 2020-08-01 ENCOUNTER — Inpatient Hospital Stay: Payer: PPO

## 2020-08-01 VITALS — BP 113/71 | HR 98 | Temp 98.1°F | Resp 18 | Ht 65.5 in | Wt 122.0 lb

## 2020-08-01 DIAGNOSIS — Z170 Estrogen receptor positive status [ER+]: Secondary | ICD-10-CM | POA: Diagnosis not present

## 2020-08-01 DIAGNOSIS — R197 Diarrhea, unspecified: Secondary | ICD-10-CM

## 2020-08-01 DIAGNOSIS — C7889 Secondary malignant neoplasm of other digestive organs: Secondary | ICD-10-CM | POA: Diagnosis not present

## 2020-08-01 DIAGNOSIS — C50919 Malignant neoplasm of unspecified site of unspecified female breast: Secondary | ICD-10-CM | POA: Diagnosis not present

## 2020-08-01 MED ORDER — OCTREOTIDE ACETATE 30 MG IM KIT
PACK | INTRAMUSCULAR | Status: AC
  Filled 2020-08-01: qty 1

## 2020-08-01 MED ORDER — OCTREOTIDE ACETATE 30 MG IM KIT
30.0000 mg | PACK | Freq: Once | INTRAMUSCULAR | Status: AC
  Administered 2020-08-01: 30 mg via INTRAMUSCULAR

## 2020-08-01 NOTE — Patient Instructions (Signed)
Octreotide acetate injection suspension What is this medicine? OCTREOTIDE (ok TREE oh tide) is used to reduce blood levels of growth hormone in patients with a condition called acromegaly. This medicine also reduces flushing and watery diarrhea caused by certain types of cancer. This medicine may be used for other purposes; ask your health care provider or pharmacist if you have questions. COMMON BRAND NAME(S): Sandostatin LAR What should I tell my health care provider before I take this medicine? They need to know if you have any of these conditions:  diabetes  gallbladder disease  kidney disease  liver disease  thyroid disease  an unusual or allergic reaction to octreotide, other medicines, foods, dyes, or preservatives  pregnant or trying to get pregnant  breast-feeding How should I use this medicine? This medicine is for injection into a muscle. It is usually given by a health care professional in a hospital or clinic setting. Talk to your pediatrician regarding the use of this medicine in children. Special care may be needed. Overdosage: If you think you have taken too much of this medicine contact a poison control center or emergency room at once. NOTE: This medicine is only for you. Do not share this medicine with others. What if I miss a dose? Keep appointments for follow-up doses. It is important not to miss your dose. Call your doctor or health care professional if you are unable to keep an appointment. What may interact with this medicine? Do not take this medicine with any of the following medications:  cisapride  dronedarone  flibanserin  lutetium Lu 177 dotatate  pimozide  saquinavir  thioridazine This medicine may also interact with the following medications:  bromocriptine  certain medicines for blood pressure, heart disease, irregular heartbeat  cyclosporine  diuretics  medicines for diabetes, including insulin  quinidine This list may not  describe all possible interactions. Give your health care provider a list of all the medicines, herbs, non-prescription drugs, or dietary supplements you use. Also tell them if you smoke, drink alcohol, or use illegal drugs. Some items may interact with your medicine. What should I watch for while using this medicine? Visit your health care professional for regular checks on your progress. Tell your health care professional if your symptoms do not start to get better or if they get worse. This medicine may cause decreases in blood sugar. Signs of low blood sugar include chills, cool, pale skin or cold sweats, drowsiness, extreme hunger, fast heartbeat, headache, nausea, nervousness or anxiety, shakiness, trembling, unsteadiness, tiredness, or weakness. Contact your doctor or health care professional right away if you experience any of these symptoms. This medicine may increase blood sugar. Ask your healthcare provider if changes in diet or medicines are needed if you have diabetes. This medicine may cause a decrease in vitamin B12. You should make sure that you get enough vitamin B12 while you are taking this medicine. Discuss the foods you eat and the vitamins you take with your health care professional. What side effects may I notice from receiving this medicine? Side effects that you should report to your doctor or health care professional as soon as possible:  allergic reactions like skin rash, itching or hives, swelling of the face, lips, or tongue  fast, slow, or irregular heartbeat  right upper belly pain  severe stomach pain  signs and symptoms of high blood sugar such as being more thirsty or hungry or having to urinate more than normal. You may also feel very tired or have   blurry vision.  signs and symptoms of low blood sugar such as feeling anxious; confusion; dizziness; increased hunger; unusually weak or tired; increased sweating; shakiness; cold, clammy skin; irritable; headache;  blurred vision; fast heartbeat; loss of consciousness  unusually weak or tired Side effects that usually do not require medical attention (report these to your doctor or health care professional if they continue or are bothersome):  diarrhea  dizziness  gas  headache  nausea, vomiting  pain, redness, or irritation at site where injected  upset stomach This list may not describe all possible side effects. Call your doctor for medical advice about side effects. You may report side effects to FDA at 1-800-FDA-1088. Where should I keep my medicine? This medicine is given in a hospital or clinic and will not be stored at home. NOTE: This sheet is a summary. It may not cover all possible information. If you have questions about this medicine, talk to your doctor, pharmacist, or health care provider.  2021 Elsevier/Gold Standard (2019-09-25 18:31:50)  

## 2020-08-22 NOTE — Progress Notes (Signed)
Patient was approved for free Afinitor through [employer] Patient Assistance through 06/20/2021. ID# [id number]

## 2020-08-26 ENCOUNTER — Inpatient Hospital Stay: Payer: PPO | Attending: Oncology | Admitting: Oncology

## 2020-08-26 ENCOUNTER — Other Ambulatory Visit: Payer: Self-pay | Admitting: Oncology

## 2020-08-26 ENCOUNTER — Other Ambulatory Visit: Payer: Self-pay

## 2020-08-26 ENCOUNTER — Telehealth: Payer: Self-pay | Admitting: Oncology

## 2020-08-26 ENCOUNTER — Telehealth: Payer: Self-pay

## 2020-08-26 ENCOUNTER — Other Ambulatory Visit: Payer: Self-pay | Admitting: Hematology and Oncology

## 2020-08-26 ENCOUNTER — Inpatient Hospital Stay: Payer: PPO

## 2020-08-26 VITALS — BP 132/80 | HR 89 | Temp 97.8°F | Resp 16 | Ht 65.5 in | Wt 121.2 lb

## 2020-08-26 DIAGNOSIS — D649 Anemia, unspecified: Secondary | ICD-10-CM | POA: Diagnosis not present

## 2020-08-26 DIAGNOSIS — Z170 Estrogen receptor positive status [ER+]: Secondary | ICD-10-CM

## 2020-08-26 DIAGNOSIS — C50919 Malignant neoplasm of unspecified site of unspecified female breast: Secondary | ICD-10-CM | POA: Insufficient documentation

## 2020-08-26 DIAGNOSIS — C785 Secondary malignant neoplasm of large intestine and rectum: Secondary | ICD-10-CM | POA: Diagnosis not present

## 2020-08-26 DIAGNOSIS — Z5111 Encounter for antineoplastic chemotherapy: Secondary | ICD-10-CM | POA: Insufficient documentation

## 2020-08-26 DIAGNOSIS — C7889 Secondary malignant neoplasm of other digestive organs: Secondary | ICD-10-CM | POA: Insufficient documentation

## 2020-08-26 DIAGNOSIS — C50111 Malignant neoplasm of central portion of right female breast: Secondary | ICD-10-CM

## 2020-08-26 DIAGNOSIS — R197 Diarrhea, unspecified: Secondary | ICD-10-CM | POA: Insufficient documentation

## 2020-08-26 LAB — COMPREHENSIVE METABOLIC PANEL
Albumin: 4 (ref 3.5–5.0)
Calcium: 7.8 — AB (ref 8.7–10.7)

## 2020-08-26 LAB — BASIC METABOLIC PANEL
BUN: 32 — AB (ref 4–21)
CO2: 18 (ref 13–22)
Chloride: 111 — AB (ref 99–108)
Creatinine: 1.4 — AB (ref 0.5–1.1)
Glucose: 110
Potassium: 2.9 — AB (ref 3.4–5.3)
Sodium: 138 (ref 137–147)

## 2020-08-26 LAB — HEPATIC FUNCTION PANEL
ALT: 79 — AB (ref 7–35)
AST: 56 — AB (ref 13–35)
Alkaline Phosphatase: 133 — AB (ref 25–125)
Bilirubin, Total: 0.5

## 2020-08-26 LAB — CBC AND DIFFERENTIAL
HCT: 31 — AB (ref 36–46)
Hemoglobin: 10.3 — AB (ref 12.0–16.0)
Neutrophils Absolute: 3.34
Platelets: 177 (ref 150–399)
WBC: 6.3

## 2020-08-26 LAB — CBC
MCV: 86 (ref 81–99)
RBC: 3.64 — AB (ref 3.87–5.11)

## 2020-08-26 MED ORDER — MAGNESIUM OXIDE 400 (241.3 MG) MG PO TABS: 400.0000 mg | ORAL_TABLET | Freq: Every day | ORAL | 2 refills | Status: DC

## 2020-08-26 MED ORDER — OPIUM 10 MG/ML (1%) PO TINC: 0.5000 mL | ORAL | 0 refills | Status: DC | PRN

## 2020-08-26 MED ORDER — POTASSIUM CHLORIDE ER 10 MEQ PO TBCR: EXTENDED_RELEASE_TABLET | ORAL | 2 refills | Status: DC

## 2020-08-26 NOTE — Telephone Encounter (Signed)
Cassie in Chemistry called with critical low magnesium. Magnesium 1.20. Dr. Bobby Rumpf made aware.

## 2020-08-26 NOTE — Telephone Encounter (Signed)
Per 3/8 los next appt sched and given to patient 

## 2020-08-26 NOTE — Progress Notes (Unsigned)
The patient's potassium was 2.9, so will start her on KCl 51meq, 2 tabs twice daily. Calcium was 7.8, so magnesium level checked and was low at 1.2, so will start po magnesium oxide 400mg  daily and give magnesium 6g IV this week. Calcium should correct with magnesium. Per Dr. Bobby Rumpf, also sent in Rx for opium tincture 0.62mL qid prn diarrhea.

## 2020-08-26 NOTE — Telephone Encounter (Signed)
Per Vida Roller, schedule patient for 3 hr IV Mag on 3/11 - Adjusted Injection to work w/Infusion

## 2020-08-26 NOTE — Progress Notes (Signed)
[facility]  [address] [place],  [place]  [address] [phone]  Clinic Day:  08/26/2020  Referring physician: Greig Right, MD   HISTORY OF PRESENT ILLNESS:  The patient is a 60 y.o. female with metastatic hormone positive breast cancer, which includes spread of disease to her stomach, which was proven per an EGD with biopsy.  Recent CT scans showed no radiographic evidence of metastatic disease.  She continues to take everolimus/exemestane for her disease control.   She comes in today for routine follow-up.  Since her last visit, she has been doing okay.  The swelling in legs has dissipated after taking lasix.  From a GI standpoint, her diarrhea remains prominent, but she claims her Sandostatin continues to help to a certain degree.  However, she remains concerned with her weight loss to where she requests some form of parenteral nutrition to be given.   PHYSICAL EXAM:  Blood pressure 132/80, pulse 89, temperature 97.8 F (36.6 C), resp. rate 16, height 5' 5.5" (1.664 m), weight 121 lb 3.2 oz (55 kg), SpO2 92 %. Wt Readings from Last 3 Encounters:  08/26/20 121 lb 3.2 oz (55 kg)  08/01/20 122 lb 0.1 oz (55.3 kg)  07/29/20 131 lb (59.4 kg)   Body mass index is 19.86 kg/m. Performance status (ECOG): 1 Physical Exam Constitutional:      Appearance: Normal appearance. She is not ill-appearing.  HENT:     Mouth/Throat:     Mouth: Mucous membranes are moist.     Pharynx: Oropharynx is clear. No oropharyngeal exudate or posterior oropharyngeal erythema.  Cardiovascular:     Rate and Rhythm: Normal rate and regular rhythm.     Heart sounds: No murmur heard. No friction rub. No gallop.   Pulmonary:     Effort: Pulmonary effort is normal. No respiratory distress.     Breath sounds: Normal breath sounds. No wheezing, rhonchi or rales.  Chest:  Breasts:     Right: No axillary adenopathy or supraclavicular adenopathy.     Left: No axillary  adenopathy or supraclavicular adenopathy.    Abdominal:     General: Bowel sounds are normal. There is no distension.     Palpations: Abdomen is soft. There is no mass.     Tenderness: There is no abdominal tenderness.  Musculoskeletal:        General: No swelling.     Right lower leg: No edema.     Left lower leg: No edema.  Lymphadenopathy:     Cervical: No cervical adenopathy.     Upper Body:     Right upper body: No supraclavicular or axillary adenopathy.     Left upper body: No supraclavicular or axillary adenopathy.     Lower Body: No right inguinal adenopathy. No left inguinal adenopathy.  Skin:    General: Skin is warm.     Coloration: Skin is not jaundiced.     Findings: No lesion or rash.  Neurological:     General: No focal deficit present.     Mental Status: She is alert and oriented to person, place, and time. Mental status is at baseline.     Cranial Nerves: Cranial nerves are intact.  Psychiatric:        Mood and Affect: Mood normal.        Behavior: Behavior normal.        Thought Content: Thought content normal.   LABS:     ASSESSMENT & PLAN:  Assessment/Plan:  A 60 y.o. female with metastatic hormone positive breast cancer, including spread of disease to her stomach.  She will continue taking everolimus/exemestane for her disease manaaement. This patient's case remains peculiar as she has no radiographic evidence of disease progression, but clearly has lobular carcinoma studded throughout her stomach and GI tract which is undoubtedly related to her diarrhea and progressive weight loss.  We will contact home health to determine the best way by which parenteral nutrition can be provided.  With respect to her metastatic breast cancer, I will have her seek a second opinion to determine the best way to address the metastatic disease within her GI tract.  Although chemotherapy can be considered, I am concerned with how well she can tolerate it.  If chemotherapy is an  option, I would not have a problem administering it at our facility.  She will also continue to receive Sandostatin monthly to minimize the severity of her diarrhea. A tincture of opium prescription will also be rewritten for her diarrhea.  I will see her back in 4 weeks for repeat clinical assessment.  The patient understands all the plans discussed today and is in agreement with them.  Dusty Raczkowski Macarthur Critchley, MD

## 2020-08-27 ENCOUNTER — Other Ambulatory Visit: Payer: Self-pay | Admitting: Hematology and Oncology

## 2020-08-27 LAB — MAGNESIUM: Magnesium: 1.2 — AB (ref 1.6–2.3)

## 2020-08-29 ENCOUNTER — Inpatient Hospital Stay: Payer: PPO

## 2020-08-29 ENCOUNTER — Other Ambulatory Visit: Payer: Self-pay

## 2020-08-29 VITALS — BP 101/63 | HR 92 | Temp 97.9°F | Resp 18 | Ht 65.5 in | Wt 115.5 lb

## 2020-08-29 DIAGNOSIS — Z5111 Encounter for antineoplastic chemotherapy: Secondary | ICD-10-CM | POA: Diagnosis not present

## 2020-08-29 DIAGNOSIS — Z170 Estrogen receptor positive status [ER+]: Secondary | ICD-10-CM | POA: Diagnosis not present

## 2020-08-29 DIAGNOSIS — C7889 Secondary malignant neoplasm of other digestive organs: Secondary | ICD-10-CM | POA: Diagnosis not present

## 2020-08-29 DIAGNOSIS — R197 Diarrhea, unspecified: Secondary | ICD-10-CM | POA: Diagnosis not present

## 2020-08-29 DIAGNOSIS — C50919 Malignant neoplasm of unspecified site of unspecified female breast: Secondary | ICD-10-CM | POA: Diagnosis not present

## 2020-08-29 MED ORDER — OCTREOTIDE ACETATE 30 MG IM KIT
PACK | INTRAMUSCULAR | Status: AC
  Filled 2020-08-29: qty 1

## 2020-08-29 MED ORDER — OCTREOTIDE ACETATE 30 MG IM KIT
30.0000 mg | PACK | Freq: Once | INTRAMUSCULAR | Status: AC
  Administered 2020-08-29: 30 mg via INTRAMUSCULAR

## 2020-08-29 MED ORDER — MAGNESIUM SULFATE 4 GM/100ML IV SOLN
INTRAVENOUS | Status: AC
  Filled 2020-08-29: qty 100

## 2020-08-29 MED ORDER — MAGNESIUM SULFATE 4 GM/100ML IV SOLN
4.0000 g | Freq: Once | INTRAVENOUS | Status: AC
  Administered 2020-08-29: 4 g via INTRAVENOUS

## 2020-08-29 MED ORDER — MAGNESIUM SULFATE 2 GM/50ML IV SOLN
INTRAVENOUS | Status: AC
  Filled 2020-08-29: qty 50

## 2020-08-29 MED ORDER — MAGNESIUM SULFATE 2 GM/50ML IV SOLN
2.0000 g | Freq: Once | INTRAVENOUS | Status: AC
  Administered 2020-08-29: 2 g via INTRAVENOUS

## 2020-08-29 NOTE — Patient Instructions (Signed)
Octreotide injection solution What is this medicine? OCTREOTIDE (ok TREE oh tide) is used to reduce blood levels of growth hormone in patients with a condition called acromegaly. This medicine also reduces flushing and watery diarrhea caused by certain types of cancer. This medicine may be used for other purposes; ask your health care provider or pharmacist if you have questions. COMMON BRAND NAME(S): Bynfezia, Sandostatin What should I tell my health care provider before I take this medicine? They need to know if you have any of these conditions:  diabetes  gallbladder disease  kidney disease  liver disease  thyroid disease  an unusual or allergic reaction to octreotide, other medicines, foods, dyes, or preservatives  pregnant or trying to get pregnant  breast-feeding How should I use this medicine? This medicine is for injection under the skin or into a vein (only in emergency situations). It is usually given by a health care professional in a hospital or clinic setting. If you get this medicine at home, you will be taught how to prepare and give this medicine. Allow the injection solution to come to room temperature before use. Do not warm it artificially. Use exactly as directed. Take your medicine at regular intervals. Do not take your medicine more often than directed. It is important that you put your used needles and syringes in a special sharps container. Do not put them in a trash can. If you do not have a sharps container, call your pharmacist or healthcare provider to get one. Talk to your pediatrician regarding the use of this medicine in children. Special care may be needed. Overdosage: If you think you have taken too much of this medicine contact a poison control center or emergency room at once. NOTE: This medicine is only for you. Do not share this medicine with others. What if I miss a dose? If you miss a dose, take it as soon as you can. If it is almost time for your  next dose, take only that dose. Do not take double or extra doses. What may interact with this medicine?  bromocriptine  certain medicines for blood pressure, heart disease, irregular heartbeat  cyclosporine  diuretics  medicines for diabetes, including insulin  quinidine This list may not describe all possible interactions. Give your health care provider a list of all the medicines, herbs, non-prescription drugs, or dietary supplements you use. Also tell them if you smoke, drink alcohol, or use illegal drugs. Some items may interact with your medicine. What should I watch for while using this medicine? Visit your doctor or health care professional for regular checks on your progress. To help reduce irritation at the injection site, use a different site for each injection and make sure the solution is at room temperature before use. This medicine may cause decreases in blood sugar. Signs of low blood sugar include chills, cool, pale skin or cold sweats, drowsiness, extreme hunger, fast heartbeat, headache, nausea, nervousness or anxiety, shakiness, trembling, unsteadiness, tiredness, or weakness. Contact your doctor or health care professional right away if you experience any of these symptoms. This medicine may increase blood sugar. Ask your healthcare provider if changes in diet or medicines are needed if you have diabetes. This medicine may cause a decrease in vitamin B12. You should make sure that you get enough vitamin B12 while you are taking this medicine. Discuss the foods you eat and the vitamins you take with your health care professional. What side effects may I notice from receiving this medicine? Side   effects that you should report to your doctor or health care professional as soon as possible:  allergic reactions like skin rash, itching or hives, swelling of the face, lips, or tongue  fast, slow, or irregular heartbeat  right upper belly pain  severe stomach pain  signs  and symptoms of high blood sugar such as being more thirsty or hungry or having to urinate more than normal. You may also feel very tired or have blurry vision.  signs and symptoms of low blood sugar such as feeling anxious; confusion; dizziness; increased hunger; unusually weak or tired; increased sweating; shakiness; cold, clammy skin; irritable; headache; blurred vision; fast heartbeat; loss of consciousness  unusually weak or tired Side effects that usually do not require medical attention (report to your doctor or health care professional if they continue or are bothersome):  diarrhea  dizziness  gas  headache  nausea, vomiting  pain, redness, or irritation at site where injected  upset stomach This list may not describe all possible side effects. Call your doctor for medical advice about side effects. You may report side effects to FDA at 1-800-FDA-1088. Where should I keep my medicine? Keep out of the reach of children. Store in a refrigerator between 2 and 8 degrees C (36 and 46 degrees F). Protect from light. Allow to come to room temperature naturally. Do not use artificial heat. If protected from light, the injection may be stored at room temperature between 20 and 30 degrees C (70 and 86 degrees F) for 14 days. After the initial use, throw away any unused portion of a multiple dose vial after 14 days. Throw away unused portions of the ampules after use. NOTE: This sheet is a summary. It may not cover all possible information. If you have questions about this medicine, talk to your doctor, pharmacist, or health care provider.  2021 Elsevier/Gold Standard (2019-01-04 21:30:86) Magnesium Salicylate tablets What is this medicine? MAGNESIUM SALICYLATE (mag NEE zhum sa LI si late) is a pain reliever. It is used to treat mild to moderate pain. It is also used to treat the pain and swelling of arthritis. This medicine may be used for other purposes; ask your health care provider or  pharmacist if you have questions. COMMON BRAND NAME(S): Doans, Masalate, MST 600, Novasal, Percogesic Backache Relief What should I tell my health care provider before I take this medicine? They need to know if you have any of these conditions:  bleeding problems  gout  heart disease  if you frequently drink alcohol containing drinks  kidney disease  liver disease  low level of vitamin K in blood  smoke tobacco  stomach ulcers or other problems  an unusual or allergic reaction to magnesium salicylate, aspirin, other medicines, dyes, or preservatives  pregnant or trying to get pregnant  breast-feeding How should I use this medicine? Take this medicine by mouth with a glass of water. Follow the directions on the package or prescription label. You can take this medicine with or without food. If it upsets your stomach, take it with food. Take your medicine at regular intervals. Do not take it more often than directed. Talk to your pediatrician regarding the use of this medicine in children. Special care may be needed. Patients over 66 years old may have a stronger reaction and need a smaller dose. Overdosage: If you think you have taken too much of this medicine contact a poison control center or emergency room at once. NOTE: This medicine is only for you.  Do not share this medicine with others. What if I miss a dose? If you miss a dose, take it as soon as you can. If it is almost time for your next dose, take only that dose. Do not take double or extra doses. What may interact with this medicine? Do not take this medicine with any of the following medications:  methotrexate  probenecid This medicine may also interact with the following medications:  acetazolamide  alcohol  antibiotics like ciprofloxacin, levofloxacin, ofloxacin, tetracycline  aspirin and aspirin-like medicines  bismuth subsalicylate  cidofovir  flavocoxid  herbal supplements like feverfew,  garlic, ginger, ginkgo biloba, horse chestnut  medicines for arthritis  medicines for diabetes  medicines for gout  medicines that treat or prevent blood clots like enoxaparin, heparin, ticlopidine, warfarin  methazolamide  sulfinpyrazone  varicella live vaccine This list may not describe all possible interactions. Give your health care provider a list of all the medicines, herbs, non-prescription drugs, or dietary supplements you use. Also tell them if you smoke, drink alcohol, or use illegal drugs. Some items may interact with your medicine. What should I watch for while using this medicine? Tell your doctor or health care professional if the pain lasts more than 10 days, if it gets worse, or if there is a new or different kind of pain. Tell your doctor if you see redness or swelling. Also, check with your doctor if a fever that lasts for more than 3 days. Do not take aspirin or aspirin-like medicines with this medicine. Too much aspirin can be dangerous. Always read the labels carefully. This medicine can irritate your stomach or cause bleeding problems. Do not smoke cigarettes or drink alcohol while taking this medicine. Do not lie down for 30 minutes after taking this medicine to prevent irritation to your throat. If you are scheduled for any medical or dental procedure, tell your healthcare provider that you are taking this medicine. You may need to stop taking this medicine before the procedure. What side effects may I notice from receiving this medicine? Side effects that you should report to your doctor or health care professional as soon as possible:  allergic reactions like skin rash, itching or hives, swelling of the face, lips, or tongue  black, tarry stools  bloody, coffee ground-like vomit  breathing problems  changes in hearing or ringing in the ears  confusion  pain on swallowing  trouble passing urine or change in the amount of urine  unusual bleeding or  bruising  unusually weak or tired Side effects that usually do not require medical attention (report to your doctor or health care professional if they continue or are bothersome):  diarrhea or constipation  nausea, vomiting  stomach gas or heartburn This list may not describe all possible side effects. Call your doctor for medical advice about side effects. You may report side effects to FDA at 1-800-FDA-1088. Where should I keep my medicine? Keep out of the reach of children. Store at room temperature between 15 and 30 degrees C (59 and 86 degrees F). Protect from heat and moisture. Throw away any unused medicine after the expiration date. NOTE: This sheet is a summary. It may not cover all possible information. If you have questions about this medicine, talk to your doctor, pharmacist, or health care provider.  2021 Elsevier/Gold Standard (2007-12-14 10:29:27)

## 2020-08-29 NOTE — Patient Instructions (Signed)
Magnesium Sulfate injection What is this medicine? MAGNESIUM SULFATE (mag NEE zee um SUL fate) is an electrolyte injection commonly used to treat low magnesium levels in your blood. It is also used to prevent or control seizures in women with preeclampsia or eclampsia. This medicine may be used for other purposes; ask your health care provider or pharmacist if you have questions. What should I tell my health care provider before I take this medicine? They need to know if you have any of these conditions:  heart disease  history of irregular heart beat  kidney disease  an unusual or allergic reaction to magnesium sulfate, medicines, foods, dyes, or preservatives  pregnant or trying to get pregnant  breast-feeding How should I use this medicine? This medicine is for infusion into a vein. It is given by a health care professional in a hospital or clinic setting. Talk to your pediatrician regarding the use of this medicine in children. While this drug may be prescribed for selected conditions, precautions do apply. Overdosage: If you think you have taken too much of this medicine contact a poison control center or emergency room at once. NOTE: This medicine is only for you. Do not share this medicine with others. What if I miss a dose? This does not apply. What may interact with this medicine? This medicine may interact with the following medications:  certain medicines for anxiety or sleep  certain medicines for seizures like phenobarbital  digoxin  medicines that relax muscles for surgery  narcotic medicines for pain This list may not describe all possible interactions. Give your health care provider a list of all the medicines, herbs, non-prescription drugs, or dietary supplements you use. Also tell them if you smoke, drink alcohol, or use illegal drugs. Some items may interact with your medicine. What should I watch for while using this medicine? Your condition will be  monitored carefully while you are receiving this medicine. You may need blood work done while you are receiving this medicine. What side effects may I notice from receiving this medicine? Side effects that you should report to your doctor or health care professional as soon as possible:  allergic reactions like skin rash, itching or hives, swelling of the face, lips, or tongue  facial flushing  muscle weakness  signs and symptoms of low blood pressure like dizziness; feeling faint or lightheaded, falls; unusually weak or tired  signs and symptoms of a dangerous change in heartbeat or heart rhythm like chest pain; dizziness; fast or irregular heartbeat; palpitations; breathing problems  sweating This list may not describe all possible side effects. Call your doctor for medical advice about side effects. You may report side effects to FDA at 1-800-FDA-1088. Where should I keep my medicine? This drug is given in a hospital or clinic and will not be stored at home. NOTE: This sheet is a summary. It may not cover all possible information. If you have questions about this medicine, talk to your doctor, pharmacist, or health care provider.  2021 Elsevier/Gold Standard (2015-12-24 12:31:42) Octreotide injection solution What is this medicine? OCTREOTIDE (ok TREE oh tide) is used to reduce blood levels of growth hormone in patients with a condition called acromegaly. This medicine also reduces flushing and watery diarrhea caused by certain types of cancer. This medicine may be used for other purposes; ask your health care provider or pharmacist if you have questions. COMMON BRAND NAME(S): Leatha Gilding, Sandostatin What should I tell my health care provider before I take this medicine? They  need to know if you have any of these conditions:  diabetes  gallbladder disease  kidney disease  liver disease  thyroid disease  an unusual or allergic reaction to octreotide, other medicines, foods,  dyes, or preservatives  pregnant or trying to get pregnant  breast-feeding How should I use this medicine? This medicine is for injection under the skin or into a vein (only in emergency situations). It is usually given by a health care professional in a hospital or clinic setting. If you get this medicine at home, you will be taught how to prepare and give this medicine. Allow the injection solution to come to room temperature before use. Do not warm it artificially. Use exactly as directed. Take your medicine at regular intervals. Do not take your medicine more often than directed. It is important that you put your used needles and syringes in a special sharps container. Do not put them in a trash can. If you do not have a sharps container, call your pharmacist or healthcare provider to get one. Talk to your pediatrician regarding the use of this medicine in children. Special care may be needed. Overdosage: If you think you have taken too much of this medicine contact a poison control center or emergency room at once. NOTE: This medicine is only for you. Do not share this medicine with others. What if I miss a dose? If you miss a dose, take it as soon as you can. If it is almost time for your next dose, take only that dose. Do not take double or extra doses. What may interact with this medicine?  bromocriptine  certain medicines for blood pressure, heart disease, irregular heartbeat  cyclosporine  diuretics  medicines for diabetes, including insulin  quinidine This list may not describe all possible interactions. Give your health care provider a list of all the medicines, herbs, non-prescription drugs, or dietary supplements you use. Also tell them if you smoke, drink alcohol, or use illegal drugs. Some items may interact with your medicine. What should I watch for while using this medicine? Visit your doctor or health care professional for regular checks on your progress. To help  reduce irritation at the injection site, use a different site for each injection and make sure the solution is at room temperature before use. This medicine may cause decreases in blood sugar. Signs of low blood sugar include chills, cool, pale skin or cold sweats, drowsiness, extreme hunger, fast heartbeat, headache, nausea, nervousness or anxiety, shakiness, trembling, unsteadiness, tiredness, or weakness. Contact your doctor or health care professional right away if you experience any of these symptoms. This medicine may increase blood sugar. Ask your healthcare provider if changes in diet or medicines are needed if you have diabetes. This medicine may cause a decrease in vitamin B12. You should make sure that you get enough vitamin B12 while you are taking this medicine. Discuss the foods you eat and the vitamins you take with your health care professional. What side effects may I notice from receiving this medicine? Side effects that you should report to your doctor or health care professional as soon as possible:  allergic reactions like skin rash, itching or hives, swelling of the face, lips, or tongue  fast, slow, or irregular heartbeat  right upper belly pain  severe stomach pain  signs and symptoms of high blood sugar such as being more thirsty or hungry or having to urinate more than normal. You may also feel very tired or have blurry vision.  signs and symptoms of low blood sugar such as feeling anxious; confusion; dizziness; increased hunger; unusually weak or tired; increased sweating; shakiness; cold, clammy skin; irritable; headache; blurred vision; fast heartbeat; loss of consciousness  unusually weak or tired Side effects that usually do not require medical attention (report to your doctor or health care professional if they continue or are bothersome):  diarrhea  dizziness  gas  headache  nausea, vomiting  pain, redness, or irritation at site where injected  upset  stomach This list may not describe all possible side effects. Call your doctor for medical advice about side effects. You may report side effects to FDA at 1-800-FDA-1088. Where should I keep my medicine? Keep out of the reach of children. Store in a refrigerator between 2 and 8 degrees C (36 and 46 degrees F). Protect from light. Allow to come to room temperature naturally. Do not use artificial heat. If protected from light, the injection may be stored at room temperature between 20 and 30 degrees C (70 and 86 degrees F) for 14 days. After the initial use, throw away any unused portion of a multiple dose vial after 14 days. Throw away unused portions of the ampules after use. NOTE: This sheet is a summary. It may not cover all possible information. If you have questions about this medicine, talk to your doctor, pharmacist, or health care provider.  2021 Elsevier/Gold Standard (2019-01-04 13:33:09)

## 2020-09-08 NOTE — Progress Notes (Signed)
The following Medication: Sandostatin has been approved thru [employer]. Enrollment period is 06/21/2020 to 06/20/2021.  Assistance ID: [id number]. Reason for Assistance: Insurance would not approve First DOS: 07/04/2020

## 2020-09-09 DIAGNOSIS — C50919 Malignant neoplasm of unspecified site of unspecified female breast: Secondary | ICD-10-CM | POA: Diagnosis not present

## 2020-09-11 ENCOUNTER — Telehealth: Payer: Self-pay

## 2020-09-11 NOTE — Telephone Encounter (Signed)
Records printed as requested and faxed to [facility] @ Dr Dee's office @ [facility].

## 2020-09-15 ENCOUNTER — Telehealth: Payer: Self-pay

## 2020-09-15 DIAGNOSIS — C7889 Secondary malignant neoplasm of other digestive organs: Secondary | ICD-10-CM | POA: Diagnosis not present

## 2020-09-15 DIAGNOSIS — Z853 Personal history of malignant neoplasm of breast: Secondary | ICD-10-CM | POA: Diagnosis not present

## 2020-09-15 NOTE — Telephone Encounter (Addendum)
RE: Nausea, vomiting ( 5 times in 24 hrs per pt) Received: Today Melodye Ped, NP  Dairl Ponder, RN Phone Number: [phone]   Oh, poor thing. Ok, yes, we'll stay in touch and can see her tomorrow if needed.            I spoke with pt. She states she took the nausea medication like I told her and sat still for for over an hour and it worked. She is keeping herself medicated prophylactic w/antiemetics. No further N/V. No fevers. She was able to eat broth last night & kept it down. I asked about her urine output. She replied, "My urine is yellow". I encouraged her to continue drinking as many liquids as possible but not so many to cause her to become nauseated. I reviewed the BRAT diet w/her. She verbalized understanding. I asked her to come in for appt today to check her labs. However, she has a funeral to attend of a close friend. She doesn't feel like she needs to come in today, but will call me back if things change. I told her to always call us with side effects, and the sooner the better so we can get here in to see a provider.       RE: Nausea, vomiting ( 5 times in 24 hrs per pt) Received: Today Melodye Ped, NP  Dairl Ponder, RN Phone Number: [phone]   I agree she probably needs a good tune-up in lots of things. I'm happy to see her today. We could maybe do fluids here if they don't have space in infusion and if she doesn't need too many other things. It would be best if she could come sooner rather than later so we'll have time to do whatever we need.       Pt's spouse, Aaron Edelman called to report that "Jazyah is really sick. Her abdomen is hurting. She has been vomiting. I don't know what you can do to help her". I asked if she had been able to keep anything down. He replied, "No". He then handed the phone to [place]. When I questioned Latika, she told me she had vomited about 5 times in the last 24 hours. "I put a little water in mouth to swish and just  wet my mouth and I threw it up". She has been unable to keep the antiemetics down either. I instructed them she needed to be taken to the hospital. I told them she had to get IV Magnesium last week, and I'm sure with the amount of vomiting  she has had, that her other electrolytes were most likely low. She needs IVF to get hydrated. Pt doesn't really want to go to hospital. She asked, "Can I just wait a little while and see if I feel better"? I told pt to take a Zofran, with just a sip of carbonated beverage and to lie still for 45 minutes. If she is unable to keep the Zofran down, there really isn't any other alternative. She needs to be seen. Afebrile. No URI s/s. No UTI s/s. I sent the above message to [facility].

## 2020-09-22 ENCOUNTER — Telehealth: Payer: Self-pay

## 2020-09-22 NOTE — Progress Notes (Signed)
[facility]  [address] [place],  [place]  [address] [phone]  Clinic Day:  09/22/2020  Referring physician: Greig Right, MD   HISTORY OF PRESENT ILLNESS:  The patient is a 60 y.o. female with metastatic hormone positive breast cancer, which includes spread of disease to her stomach, which was proven per an EGD with biopsy.  Recent CT scans showed no radiographic evidence of metastatic disease.  She has been taking everolimus/exemestane for her disease control over these past months.   She comes in today for routine follow-up.  Since her last visit, she did go to [facility] for a 2nd opinion.   Based upon their notes, they are also concerned about disease progression on her everolimus/exemestane.  As her disease has progressed on multiple endocrine therapies, they appear to be of the belief that systemic chemotherapy needs to be considered.  They also appear to be concerned with a developing rash over her right chest that may suggest disease recurrence to where a skin biopsy needs to be considered.  Overall, the patient claims to be doing okay.  From a GI standpoint, her diarrhea remains prominent, but she claims her Sandostatin continues to help to a certain degree.    PHYSICAL EXAM:  Blood pressure 94/60, pulse 100, temperature 98.2 F (36.8 C), resp. rate 16, height 5' 5.5" (1.664 m), weight 119 lb (54 kg), SpO2 98 %. Wt Readings from Last 3 Encounters:  09/23/20 119 lb (54 kg)  08/29/20 115 lb 8 oz (52.4 kg)  08/26/20 121 lb 3.2 oz (55 kg)   Body mass index is 19.5 kg/m. Performance status (ECOG): 1 Physical Exam Constitutional:      Appearance: Normal appearance. She is not ill-appearing.  HENT:     Mouth/Throat:     Mouth: Mucous membranes are moist.     Pharynx: Oropharynx is clear. No oropharyngeal exudate or posterior oropharyngeal erythema.  Cardiovascular:     Rate and Rhythm: Normal rate and regular rhythm.     Heart sounds: No murmur  heard. No friction rub. No gallop.   Pulmonary:     Effort: Pulmonary effort is normal. No respiratory distress.     Breath sounds: Normal breath sounds. No wheezing, rhonchi or rales.  Chest:  Breasts:     Right: No axillary adenopathy or supraclavicular adenopathy.     Left: No axillary adenopathy or supraclavicular adenopathy.    Abdominal:     General: Bowel sounds are normal. There is no distension.     Palpations: Abdomen is soft. There is no mass.     Tenderness: There is no abdominal tenderness.  Musculoskeletal:        General: No swelling.     Right lower leg: No edema.     Left lower leg: No edema.  Lymphadenopathy:     Cervical: No cervical adenopathy.     Upper Body:     Right upper body: No supraclavicular or axillary adenopathy.     Left upper body: No supraclavicular or axillary adenopathy.     Lower Body: No right inguinal adenopathy. No left inguinal adenopathy.  Skin:    General: Skin is warm.     Coloration: Skin is not jaundiced.     Findings: No lesion or rash.  Neurological:     General: No focal deficit present.     Mental Status: She is alert and oriented to person, place, and time. Mental status is at baseline.     Cranial  Nerves: Cranial nerves are intact.  Psychiatric:        Mood and Affect: Mood normal.        Behavior: Behavior normal.        Thought Content: Thought content normal.   LABS:   Ref. Range 09/23/2020 00:00  Sodium Latest Ref Range: 137 - 147  138  Potassium Latest Ref Range: 3.4 - 5.3  3.3 (A)  Chloride Latest Ref Range: 99 - 108  105  CO2 Latest Ref Range: 13 - 22  26 (A)  Glucose Unknown 97  BUN Latest Ref Range: 4 - 21  21  Creatinine Latest Ref Range: 0.5 - 1.1  1.2 (A)  Calcium Latest Ref Range: 8.7 - 10.7  7.3 (A)  Alkaline Phosphatase Latest Ref Range: 25 - 125  188 (A)  Albumin Latest Ref Range: 3.5 - 5.0  3.4 (A)  AST Latest Ref Range: 13 - 35  44 (A)  ALT Latest Ref Range: 7 - 35  29  Bilirubin, Total Unknown  0.5  WBC Unknown 4.8  RBC Latest Ref Range: 3.87 - 5.11  3.54 (A)  Hemoglobin Latest Ref Range: 12.0 - 16.0  9.8 (A)  HCT Latest Ref Range: 36 - 46  31 (A)  Platelets Latest Ref Range: 150 - 399  232  NEUT# Unknown 2.30   ASSESSMENT & PLAN:  Assessment/Plan:  A 60 y.o. female with metastatic hormone positive breast cancer, including spread of disease to her stomach.  Although she remains on everolimus/exemestane for her disease management, I anticipate switching her to systemic chemotherapy, likely in the form of carboplatin/gemcitabine.  I will arrange for her to undergo a right chest wall biopsy in the forthcoming days to prove the presence of disease progression.  I will also arrange for her to have a port placed for future IV chemotherapy.  I will see her back in 2 weeks; hopefully, by then, her skin biopsy will have been done. The patient understands all the plans discussed today and is in agreement with them.  Kessler Kopinski Macarthur Critchley, MD

## 2020-09-22 NOTE — Telephone Encounter (Signed)
----- Message from Melodye Ped, NP sent at 09/16/2020 10:05 AM EDT ----- Regarding: RE: Nausea, vomiting ( 5 times in 24 hrs per pt) Contact: [phone] Oh, poor thing. Ok, yes, we'll stay in touch and can see her tomorrow if needed. ----- Message ----- From: Dairl Ponder, RN Sent: 09/16/2020   9:47 AM EDT To: Melodye Ped, NP Subject: FW: Nausea, vomiting ( 5 times in 24 hrs per#  I spoke with pt. She states she took the nausea medication like I told her and sat still for for over an hour and it worked. She is keeping herself medicated prophylactic w/antiemetics. No further N/V. No fevers. She was able to eat broth last night & kept it down. I asked about her urine output. She replied, "My urine is yellow". I encouraged her to continue drinking as many liquids as possible but not so many to cause her to become nauseated. I reviewed the BRAT diet w/her. She verbalized understanding. I asked her to come in for appt today to check her labs. However, she has a funeral to attend of a close friend. She doesn't feel like she needs to come in today, but will call me back if things change. I told her to always call us with side effects, and the sooner the better so we can get here in to see a provider.    ----- Message ----- From: Melodye Ped, NP Sent: 09/16/2020   9:19 AM EDT To: Dairl Ponder, RN Subject: RE: Nausea, vomiting ( 5 times in 24 hrs per#  I agree she probably needs a good tune-up in lots of things. I'm happy to see her today. We could maybe do fluids here if they don't have space in infusion and if she doesn't need too many other things. It would be best if she could come sooner rather than later so we'll have time to do whatever we need.  ----- Message ----- From: Dairl Ponder, RN Sent: 09/15/2020   4:37 PM EDT To: Melodye Ped, NP Subject: Nausea, vomiting ( 5 times in 24 hrs per pt)   Pt's spouse, Aaron Edelman called to report that "[place] is really  sick. Her abdomen is hurting. She has been vomiting. I don't know what you can do to help her". I asked if she had been able to keep anything down. He replied, "No". He then handed the phone to [place]. When I questioned Y[place], she told me she had vomited about 5 times in the last 24 hours. "I put a little water in mouth to swish and just wet my mouth and I threw it up". She has been unable to keep the antiemetics down either. I instructed them she needed to be taken to the hospital. I told them she had to get IV Magnesium last week, and I'm sure with the amount of vomiting  she has had, that her other electrolytes were most likely low. She needs IVF to get hydrated. Pt doesn't really want to go to hospital. She asked, "Can I just wait a little while and see if I feel better"? I told pt to take a Zofran, with just a sip of carbonated beverage and to lie still for 45 minutes. If she is unable to keep the Zofran down, there really isn't any other alternative. Afebrile. No URI s/s. No UTI s/s. No change in baseline diarrhea. She needs to be seen.  I didn't know if you would like to call them or not.

## 2020-09-23 ENCOUNTER — Other Ambulatory Visit: Payer: Self-pay | Admitting: Oncology

## 2020-09-23 ENCOUNTER — Telehealth: Payer: Self-pay | Admitting: Oncology

## 2020-09-23 ENCOUNTER — Inpatient Hospital Stay: Payer: PPO | Attending: Oncology | Admitting: Oncology

## 2020-09-23 ENCOUNTER — Inpatient Hospital Stay: Payer: PPO

## 2020-09-23 ENCOUNTER — Other Ambulatory Visit: Payer: Self-pay | Admitting: Hematology and Oncology

## 2020-09-23 ENCOUNTER — Other Ambulatory Visit: Payer: Self-pay

## 2020-09-23 VITALS — BP 94/60 | HR 100 | Temp 98.2°F | Resp 16 | Ht 65.5 in | Wt 119.0 lb

## 2020-09-23 DIAGNOSIS — Z170 Estrogen receptor positive status [ER+]: Secondary | ICD-10-CM | POA: Diagnosis not present

## 2020-09-23 DIAGNOSIS — C50111 Malignant neoplasm of central portion of right female breast: Secondary | ICD-10-CM | POA: Diagnosis not present

## 2020-09-23 DIAGNOSIS — C50919 Malignant neoplasm of unspecified site of unspecified female breast: Secondary | ICD-10-CM | POA: Insufficient documentation

## 2020-09-23 DIAGNOSIS — C785 Secondary malignant neoplasm of large intestine and rectum: Secondary | ICD-10-CM

## 2020-09-23 DIAGNOSIS — C773 Secondary and unspecified malignant neoplasm of axilla and upper limb lymph nodes: Secondary | ICD-10-CM

## 2020-09-23 DIAGNOSIS — D649 Anemia, unspecified: Secondary | ICD-10-CM | POA: Diagnosis not present

## 2020-09-23 DIAGNOSIS — C7889 Secondary malignant neoplasm of other digestive organs: Secondary | ICD-10-CM | POA: Insufficient documentation

## 2020-09-23 LAB — COMPREHENSIVE METABOLIC PANEL
Albumin: 3.4 — AB (ref 3.5–5.0)
Calcium: 7.3 — AB (ref 8.7–10.7)

## 2020-09-23 LAB — HEPATIC FUNCTION PANEL
ALT: 29 (ref 7–35)
AST: 44 — AB (ref 13–35)
Alkaline Phosphatase: 188 — AB (ref 25–125)
Bilirubin, Total: 0.5

## 2020-09-23 LAB — BASIC METABOLIC PANEL
BUN: 21 (ref 4–21)
CO2: 26 — AB (ref 13–22)
Chloride: 105 (ref 99–108)
Creatinine: 1.2 — AB (ref 0.5–1.1)
Glucose: 97
Potassium: 3.3 — AB (ref 3.4–5.3)
Sodium: 138 (ref 137–147)

## 2020-09-23 LAB — CBC AND DIFFERENTIAL
HCT: 31 — AB (ref 36–46)
Hemoglobin: 9.8 — AB (ref 12.0–16.0)
Neutrophils Absolute: 2.3
Platelets: 232 (ref 150–399)
WBC: 4.8

## 2020-09-23 LAB — CBC: RBC: 3.54 — AB (ref 3.87–5.11)

## 2020-09-23 NOTE — Telephone Encounter (Signed)
Per 4/5 LOS, patient scheduled for 4/19 Labs, Follow Up and next 2 Injections.  Gave patient Appt Summary

## 2020-09-25 ENCOUNTER — Telehealth: Payer: Self-pay

## 2020-09-25 NOTE — Telephone Encounter (Signed)
Pt LVM on nurse triage line, asking if Dr Bobby Rumpf wanted her to continue the Everolimus and exemestane while waiting for the biopsy? If so, she will need to reorder. Dr Bobby Rumpf not in clinic, as of yet (905a). I will speak w/him when he arrives.   Dr Bobby Rumpf recommends she not reorder the meds. I called ot and notified her of Dr Bobby Rumpf' response. She verbalized understanding. Pt tells me she is scheduled for her biopsy on 10/07/20 @ 830am. She is leaving for cruise on 09/29/20 - thus why bx is scheduled out further. Dr Bobby Rumpf wants me to push his f/u appt out to 10/14/20, so he will have results.  Pt notified of need to change appt, to allow for biopsy results.

## 2020-09-26 ENCOUNTER — Other Ambulatory Visit: Payer: Self-pay

## 2020-09-26 ENCOUNTER — Inpatient Hospital Stay: Payer: PPO

## 2020-09-26 VITALS — BP 108/56 | HR 78 | Temp 97.9°F | Resp 18 | Ht 65.5 in | Wt 111.5 lb

## 2020-09-26 DIAGNOSIS — R197 Diarrhea, unspecified: Secondary | ICD-10-CM

## 2020-09-26 DIAGNOSIS — C7889 Secondary malignant neoplasm of other digestive organs: Secondary | ICD-10-CM | POA: Diagnosis not present

## 2020-09-26 DIAGNOSIS — Z170 Estrogen receptor positive status [ER+]: Secondary | ICD-10-CM | POA: Diagnosis not present

## 2020-09-26 DIAGNOSIS — Z20828 Contact with and (suspected) exposure to other viral communicable diseases: Secondary | ICD-10-CM | POA: Diagnosis not present

## 2020-09-26 DIAGNOSIS — Z1159 Encounter for screening for other viral diseases: Secondary | ICD-10-CM | POA: Diagnosis not present

## 2020-09-26 DIAGNOSIS — C50919 Malignant neoplasm of unspecified site of unspecified female breast: Secondary | ICD-10-CM | POA: Diagnosis not present

## 2020-09-26 MED ORDER — OCTREOTIDE ACETATE 30 MG IM KIT
30.0000 mg | PACK | Freq: Once | INTRAMUSCULAR | Status: AC
  Administered 2020-09-26: 30 mg via INTRAMUSCULAR

## 2020-09-26 NOTE — Patient Instructions (Signed)
Octreotide injection solution What is this medicine? OCTREOTIDE (ok TREE oh tide) is used to reduce blood levels of growth hormone in patients with a condition called acromegaly. This medicine also reduces flushing and watery diarrhea caused by certain types of cancer. This medicine may be used for other purposes; ask your health care provider or pharmacist if you have questions. COMMON BRAND NAME(S): Bynfezia, Sandostatin What should I tell my health care provider before I take this medicine? They need to know if you have any of these conditions:  diabetes  gallbladder disease  kidney disease  liver disease  thyroid disease  an unusual or allergic reaction to octreotide, other medicines, foods, dyes, or preservatives  pregnant or trying to get pregnant  breast-feeding How should I use this medicine? This medicine is for injection under the skin or into a vein (only in emergency situations). It is usually given by a health care professional in a hospital or clinic setting. If you get this medicine at home, you will be taught how to prepare and give this medicine. Allow the injection solution to come to room temperature before use. Do not warm it artificially. Use exactly as directed. Take your medicine at regular intervals. Do not take your medicine more often than directed. It is important that you put your used needles and syringes in a special sharps container. Do not put them in a trash can. If you do not have a sharps container, call your pharmacist or healthcare provider to get one. Talk to your pediatrician regarding the use of this medicine in children. Special care may be needed. Overdosage: If you think you have taken too much of this medicine contact a poison control center or emergency room at once. NOTE: This medicine is only for you. Do not share this medicine with others. What if I miss a dose? If you miss a dose, take it as soon as you can. If it is almost time for your  next dose, take only that dose. Do not take double or extra doses. What may interact with this medicine?  bromocriptine  certain medicines for blood pressure, heart disease, irregular heartbeat  cyclosporine  diuretics  medicines for diabetes, including insulin  quinidine This list may not describe all possible interactions. Give your health care provider a list of all the medicines, herbs, non-prescription drugs, or dietary supplements you use. Also tell them if you smoke, drink alcohol, or use illegal drugs. Some items may interact with your medicine. What should I watch for while using this medicine? Visit your doctor or health care professional for regular checks on your progress. To help reduce irritation at the injection site, use a different site for each injection and make sure the solution is at room temperature before use. This medicine may cause decreases in blood sugar. Signs of low blood sugar include chills, cool, pale skin or cold sweats, drowsiness, extreme hunger, fast heartbeat, headache, nausea, nervousness or anxiety, shakiness, trembling, unsteadiness, tiredness, or weakness. Contact your doctor or health care professional right away if you experience any of these symptoms. This medicine may increase blood sugar. Ask your healthcare provider if changes in diet or medicines are needed if you have diabetes. This medicine may cause a decrease in vitamin B12. You should make sure that you get enough vitamin B12 while you are taking this medicine. Discuss the foods you eat and the vitamins you take with your health care professional. What side effects may I notice from receiving this medicine? Side   effects that you should report to your doctor or health care professional as soon as possible:  allergic reactions like skin rash, itching or hives, swelling of the face, lips, or tongue  fast, slow, or irregular heartbeat  right upper belly pain  severe stomach pain  signs  and symptoms of high blood sugar such as being more thirsty or hungry or having to urinate more than normal. You may also feel very tired or have blurry vision.  signs and symptoms of low blood sugar such as feeling anxious; confusion; dizziness; increased hunger; unusually weak or tired; increased sweating; shakiness; cold, clammy skin; irritable; headache; blurred vision; fast heartbeat; loss of consciousness  unusually weak or tired Side effects that usually do not require medical attention (report to your doctor or health care professional if they continue or are bothersome):  diarrhea  dizziness  gas  headache  nausea, vomiting  pain, redness, or irritation at site where injected  upset stomach This list may not describe all possible side effects. Call your doctor for medical advice about side effects. You may report side effects to FDA at 1-800-FDA-1088. Where should I keep my medicine? Keep out of the reach of children. Store in a refrigerator between 2 and 8 degrees C (36 and 46 degrees F). Protect from light. Allow to come to room temperature naturally. Do not use artificial heat. If protected from light, the injection may be stored at room temperature between 20 and 30 degrees C (70 and 86 degrees F) for 14 days. After the initial use, throw away any unused portion of a multiple dose vial after 14 days. Throw away unused portions of the ampules after use. NOTE: This sheet is a summary. It may not cover all possible information. If you have questions about this medicine, talk to your doctor, pharmacist, or health care provider.  2021 Elsevier/Gold Standard (2019-01-04 13:33:09)  

## 2020-10-06 DIAGNOSIS — L03115 Cellulitis of right lower limb: Secondary | ICD-10-CM | POA: Diagnosis not present

## 2020-10-06 DIAGNOSIS — R2243 Localized swelling, mass and lump, lower limb, bilateral: Secondary | ICD-10-CM | POA: Diagnosis not present

## 2020-10-06 DIAGNOSIS — Z20828 Contact with and (suspected) exposure to other viral communicable diseases: Secondary | ICD-10-CM | POA: Diagnosis not present

## 2020-10-07 ENCOUNTER — Ambulatory Visit: Payer: PPO | Admitting: Oncology

## 2020-10-07 ENCOUNTER — Other Ambulatory Visit: Payer: PPO

## 2020-10-07 DIAGNOSIS — D485 Neoplasm of uncertain behavior of skin: Secondary | ICD-10-CM | POA: Insufficient documentation

## 2020-10-07 DIAGNOSIS — C7889 Secondary malignant neoplasm of other digestive organs: Secondary | ICD-10-CM | POA: Diagnosis not present

## 2020-10-07 DIAGNOSIS — C785 Secondary malignant neoplasm of large intestine and rectum: Secondary | ICD-10-CM | POA: Diagnosis not present

## 2020-10-07 DIAGNOSIS — L83 Acanthosis nigricans: Secondary | ICD-10-CM | POA: Diagnosis not present

## 2020-10-07 DIAGNOSIS — C773 Secondary and unspecified malignant neoplasm of axilla and upper limb lymph nodes: Secondary | ICD-10-CM | POA: Diagnosis not present

## 2020-10-07 DIAGNOSIS — Z170 Estrogen receptor positive status [ER+]: Secondary | ICD-10-CM | POA: Diagnosis not present

## 2020-10-07 DIAGNOSIS — C50111 Malignant neoplasm of central portion of right female breast: Secondary | ICD-10-CM | POA: Diagnosis not present

## 2020-10-13 NOTE — Progress Notes (Signed)
[facility]  [address] [place],  [place]  [address] [phone]  Clinic Day:  10/14/2020  Referring physician: Greig Right, MD   HISTORY OF PRESENT ILLNESS:  The patient is a 60 y.o. female with metastatic hormone positive breast cancer, which includes spread of disease to her stomach, which was proven per an EGD with biopsy.  Recent CT scans showed no radiographic evidence of metastatic disease.  She had been taking everolimus/exemestane for her disease control over these past months.  However, after a 2nd opinion at [facility], they felt palliative chemotherapy was necessary.  She comes in today to go over her right chest wall skin biopsy to determine if this represents metastatic breast cancer.  Since her last visit, the patient has been doing well.  She claims her diarrhea has gotten better, particularly since she stopped taking her everolimus/exemestane therapy a few weeks ago.   PHYSICAL EXAM:  Blood pressure (!) 126/59, pulse 79, temperature 98.1 F (36.7 C), resp. rate 16, height 5' 5.5" (1.664 m), weight 125 lb 6.4 oz (56.9 kg), SpO2 95 %. Wt Readings from Last 3 Encounters:  10/14/20 125 lb 6.4 oz (56.9 kg)  09/26/20 111 lb 8 oz (50.6 kg)  09/23/20 119 lb (54 kg)   Body mass index is 20.55 kg/m. Performance status (ECOG): 1 Physical Exam Constitutional:      Appearance: Normal appearance. She is not ill-appearing.  HENT:     Mouth/Throat:     Mouth: Mucous membranes are moist.     Pharynx: Oropharynx is clear. No oropharyngeal exudate or posterior oropharyngeal erythema.  Cardiovascular:     Rate and Rhythm: Normal rate and regular rhythm.     Heart sounds: No murmur heard. No friction rub. No gallop.   Pulmonary:     Effort: Pulmonary effort is normal. No respiratory distress.     Breath sounds: Normal breath sounds. No wheezing, rhonchi or rales.  Chest:  Breasts:     Right: No axillary adenopathy or supraclavicular adenopathy.      Left: No axillary adenopathy or supraclavicular adenopathy.    Abdominal:     General: Bowel sounds are normal. There is no distension.     Palpations: Abdomen is soft. There is no mass.     Tenderness: There is no abdominal tenderness.  Musculoskeletal:        General: No swelling.     Right lower leg: No edema.     Left lower leg: No edema.  Lymphadenopathy:     Cervical: No cervical adenopathy.     Upper Body:     Right upper body: No supraclavicular or axillary adenopathy.     Left upper body: No supraclavicular or axillary adenopathy.     Lower Body: No right inguinal adenopathy. No left inguinal adenopathy.  Skin:    General: Skin is warm.     Coloration: Skin is not jaundiced.     Findings: No lesion or rash.  Neurological:     General: No focal deficit present.     Mental Status: She is alert and oriented to person, place, and time. Mental status is at baseline.     Cranial Nerves: Cranial nerves are intact.  Psychiatric:        Mood and Affect: Mood normal.        Behavior: Behavior normal.        Thought Content: Thought content normal.    PATHOLOGY:  Her right chest wall skin biopsy revealed  the following:   LABS:   Ref. Range 10/14/2020 00:00  Sodium Latest Ref Range: 137 - 147  135 (A)  Potassium Latest Ref Range: 3.4 - 5.3  3.8  Chloride Latest Ref Range: 99 - 108  103  CO2 Latest Ref Range: 13 - 22  30 (A)  Glucose Unknown 98  BUN Latest Ref Range: 4 - 21  15  Creatinine Latest Ref Range: 0.5 - 1.1  1.0  Calcium Latest Ref Range: 8.7 - 10.7  7.7 (A)  Alkaline Phosphatase Latest Ref Range: 25 - 125  128 (A)  Albumin Latest Ref Range: 3.5 - 5.0  3.0 (A)  AST Latest Ref Range: 13 - 35  31  ALT Latest Ref Range: 7 - 35  16  Total Protein Latest Ref Range: 6.3 - 8.2 g/dL 5.7 (A)  Bilirubin, Total Unknown 0.6  WBC Unknown 7.6  RBC Latest Ref Range: 3.87 - 5.11  3.53 (A)  Hemoglobin Latest Ref Range: 12.0 - 16.0  10.5 (A)  HCT Latest Ref Range: 36 - 46   33 (A)  MCV Latest Ref Range: 81 - 99  93  Platelets Latest Ref Range: 150 - 399  350  NEUT# Unknown 5.09   ASSESSMENT & PLAN:  Assessment/Plan:  A 60 y.o. female with metastatic hormone positive breast cancer, including spread of disease to her stomach.  Although her skin biopsy is negative for cancer, her internal metastatic gastric disease warrants a change in therapy.  I will switch her to systemic chemotherapy, in the form of carboplatin/gemcitabine.  Although scans 3 months ago showed no evidence of metastatic disease, repeat scans will be done next week to determine if there is any lesion which can be followed over time to assess response to her upcoming chemotherapy.  I will see her next week to go over these scans and their implications.  The patient understands all the plans discussed today and is in agreement with them.  Limmie Schoenberg Macarthur Critchley, MD

## 2020-10-14 ENCOUNTER — Telehealth: Payer: Self-pay | Admitting: Oncology

## 2020-10-14 ENCOUNTER — Other Ambulatory Visit: Payer: Self-pay

## 2020-10-14 ENCOUNTER — Other Ambulatory Visit: Payer: Self-pay | Admitting: Hematology and Oncology

## 2020-10-14 ENCOUNTER — Other Ambulatory Visit: Payer: Self-pay | Admitting: Oncology

## 2020-10-14 ENCOUNTER — Inpatient Hospital Stay: Payer: PPO

## 2020-10-14 ENCOUNTER — Inpatient Hospital Stay: Payer: PPO | Admitting: Oncology

## 2020-10-14 VITALS — BP 126/59 | HR 79 | Temp 98.1°F | Resp 16 | Ht 65.5 in | Wt 125.4 lb

## 2020-10-14 DIAGNOSIS — C50111 Malignant neoplasm of central portion of right female breast: Secondary | ICD-10-CM

## 2020-10-14 DIAGNOSIS — Z170 Estrogen receptor positive status [ER+]: Secondary | ICD-10-CM | POA: Diagnosis not present

## 2020-10-14 DIAGNOSIS — D649 Anemia, unspecified: Secondary | ICD-10-CM | POA: Diagnosis not present

## 2020-10-14 LAB — BASIC METABOLIC PANEL
BUN: 15 (ref 4–21)
CO2: 30 — AB (ref 13–22)
Chloride: 103 (ref 99–108)
Creatinine: 1 (ref 0.5–1.1)
Glucose: 98
Potassium: 3.8 (ref 3.4–5.3)
Sodium: 135 — AB (ref 137–147)

## 2020-10-14 LAB — CBC
MCV: 93 (ref 81–99)
RBC: 3.53 — AB (ref 3.87–5.11)

## 2020-10-14 LAB — CBC AND DIFFERENTIAL
HCT: 33 — AB (ref 36–46)
Hemoglobin: 10.5 — AB (ref 12.0–16.0)
Neutrophils Absolute: 5.09
Platelets: 350 (ref 150–399)
WBC: 7.6

## 2020-10-14 LAB — CORRECTED CALCIUM (CC13): Calcium, Corrected: 8.7

## 2020-10-14 LAB — HEPATIC FUNCTION PANEL
ALT: 16 (ref 7–35)
AST: 31 (ref 13–35)
Alkaline Phosphatase: 128 — AB (ref 25–125)
Bilirubin, Total: 0.6

## 2020-10-14 LAB — PROTEIN, TOTAL: Total Protein: 5.7 g/dL — AB (ref 6.3–8.2)

## 2020-10-14 LAB — COMPREHENSIVE METABOLIC PANEL
Albumin: 3 — AB (ref 3.5–5.0)
Calcium: 7.7 — AB (ref 8.7–10.7)

## 2020-10-14 NOTE — Telephone Encounter (Signed)
Per 4/26 los next appt and confirmed with patient

## 2020-10-15 DIAGNOSIS — I2510 Atherosclerotic heart disease of native coronary artery without angina pectoris: Secondary | ICD-10-CM | POA: Diagnosis not present

## 2020-10-15 DIAGNOSIS — K219 Gastro-esophageal reflux disease without esophagitis: Secondary | ICD-10-CM | POA: Diagnosis not present

## 2020-10-15 DIAGNOSIS — Z87891 Personal history of nicotine dependence: Secondary | ICD-10-CM | POA: Diagnosis not present

## 2020-10-15 DIAGNOSIS — Z853 Personal history of malignant neoplasm of breast: Secondary | ICD-10-CM | POA: Diagnosis not present

## 2020-10-15 DIAGNOSIS — C773 Secondary and unspecified malignant neoplasm of axilla and upper limb lymph nodes: Secondary | ICD-10-CM | POA: Diagnosis not present

## 2020-10-15 DIAGNOSIS — Z9221 Personal history of antineoplastic chemotherapy: Secondary | ICD-10-CM | POA: Diagnosis not present

## 2020-10-15 DIAGNOSIS — Z9011 Acquired absence of right breast and nipple: Secondary | ICD-10-CM | POA: Diagnosis not present

## 2020-10-15 DIAGNOSIS — G4733 Obstructive sleep apnea (adult) (pediatric): Secondary | ICD-10-CM | POA: Diagnosis not present

## 2020-10-15 DIAGNOSIS — Z923 Personal history of irradiation: Secondary | ICD-10-CM | POA: Diagnosis not present

## 2020-10-15 DIAGNOSIS — E785 Hyperlipidemia, unspecified: Secondary | ICD-10-CM | POA: Diagnosis not present

## 2020-10-15 DIAGNOSIS — C7889 Secondary malignant neoplasm of other digestive organs: Secondary | ICD-10-CM | POA: Diagnosis not present

## 2020-10-15 DIAGNOSIS — Z452 Encounter for adjustment and management of vascular access device: Secondary | ICD-10-CM | POA: Diagnosis not present

## 2020-10-15 DIAGNOSIS — J45909 Unspecified asthma, uncomplicated: Secondary | ICD-10-CM | POA: Diagnosis not present

## 2020-10-15 DIAGNOSIS — C7981 Secondary malignant neoplasm of breast: Secondary | ICD-10-CM | POA: Diagnosis not present

## 2020-10-15 DIAGNOSIS — C7989 Secondary malignant neoplasm of other specified sites: Secondary | ICD-10-CM | POA: Diagnosis not present

## 2020-10-15 DIAGNOSIS — C50111 Malignant neoplasm of central portion of right female breast: Secondary | ICD-10-CM | POA: Diagnosis not present

## 2020-10-15 DIAGNOSIS — D485 Neoplasm of uncertain behavior of skin: Secondary | ICD-10-CM | POA: Diagnosis not present

## 2020-10-17 ENCOUNTER — Inpatient Hospital Stay: Payer: PPO

## 2020-10-17 ENCOUNTER — Telehealth: Payer: Self-pay | Admitting: Hematology and Oncology

## 2020-10-17 ENCOUNTER — Inpatient Hospital Stay: Payer: PPO | Admitting: Hematology and Oncology

## 2020-10-17 ENCOUNTER — Encounter: Payer: Self-pay | Admitting: Hematology and Oncology

## 2020-10-17 VITALS — BP 126/60 | HR 80 | Temp 98.0°F | Resp 18 | Ht 65.5 in | Wt 128.0 lb

## 2020-10-17 DIAGNOSIS — Z170 Estrogen receptor positive status [ER+]: Secondary | ICD-10-CM

## 2020-10-17 DIAGNOSIS — C50111 Malignant neoplasm of central portion of right female breast: Secondary | ICD-10-CM

## 2020-10-17 DIAGNOSIS — D649 Anemia, unspecified: Secondary | ICD-10-CM | POA: Diagnosis not present

## 2020-10-17 LAB — CBC: RBC: 3.6 — AB (ref 3.87–5.11)

## 2020-10-17 LAB — BASIC METABOLIC PANEL
BUN: 16 (ref 4–21)
CO2: 28 — AB (ref 13–22)
Chloride: 105 (ref 99–108)
Creatinine: 1.1 (ref 0.5–1.1)
Glucose: 116
Potassium: 3.9 (ref 3.4–5.3)
Sodium: 137 (ref 137–147)

## 2020-10-17 LAB — CBC AND DIFFERENTIAL
HCT: 34 — AB (ref 36–46)
Hemoglobin: 10.6 — AB (ref 12.0–16.0)
Neutrophils Absolute: 4.97
Platelets: 282 (ref 150–399)
WBC: 7

## 2020-10-17 LAB — COMPREHENSIVE METABOLIC PANEL
Albumin: 3 — AB (ref 3.5–5.0)
Calcium: 7.9 — AB (ref 8.7–10.7)

## 2020-10-17 LAB — HEPATIC FUNCTION PANEL
ALT: 14 (ref 7–35)
AST: 28 (ref 13–35)
Alkaline Phosphatase: 114 (ref 25–125)
Bilirubin, Total: 0.7

## 2020-10-17 MED ORDER — PROCHLORPERAZINE MALEATE 10 MG PO TABS: 10.0000 mg | ORAL_TABLET | Freq: Four times a day (QID) | ORAL | 3 refills | Status: AC | PRN

## 2020-10-17 MED ORDER — ONDANSETRON 8 MG PO TBDP: 8.0000 mg | ORAL_TABLET | Freq: Three times a day (TID) | ORAL | 0 refills | Status: DC | PRN

## 2020-10-17 NOTE — Progress Notes (Signed)
The patient is a with metastatic breast cancer.  Patient presents to clinic today for chemotherapy education and palliative care consult.  We will start carboplatin/ gemcitabine on 10/22/2020.  We will send in prescriptions for prochlorperazine and ondansetron.  The patient verbalizes understanding of and agreement to the plan as discussed today.  Provided general information including the following: 1.  Date of education: 10/17/2020 2.  Physician name: Dr. Bobby Rumpf 3.  Diagnosis: Metastatic breast cancer 4.  Stage: Metastatic 5.  Palliative 6.  Chemotherapy plan including drugs and how often: carboplatin/ gemcitabine 7.  Start date: 10/22/2020 8.  Other referrals: No other referrals at this time 9.  The patient is to call our office with any questions or concerns.  Our office number [phone], if after hours or on the weekend, call the same number and wait for the answering service.  There is always an oncologist on call 10.  Medications prescribed: prochlorperazine, ondansetron 11.  The patient has verbalized understanding of the treatment plan and has no barriers to adherence or understanding.  Obtained signed consent from patient.  Discussed symptoms including 1.  Low blood counts including red blood cells, white blood cells and platelets. 2. Infection including to avoid large crowds, wash hands frequently, and stay away from people who were sick.  If fever develops of 100.4 or higher, call our office. 3.  Mucositis-given instructions on mouth rinse (baking soda and salt mixture).  Keep mouth clean.  Use soft bristle toothbrush.  If mouth sores develop, call our clinic. 4.  Nausea/vomiting-gave prescriptions for ondansetron 4 mg every 4 hours as needed for nausea, may take around the clock if persistent.  Compazine 10 mg every 6 hours, may take around the clock if persistent. 5.  Diarrhea-use over-the-counter Imodium.  Call clinic if not controlled. 6.  Constipation-use senna, 1 to 2  tablets twice a day.  If no BM in 2 to 3 days call the clinic. 7.  Loss of appetite-try to eat small meals every 2-3 hours.  Call clinic if not eating. 8.  Taste changes-zinc 500 mg daily.  If becomes severe call clinic. 9.  Alcoholic beverages. 10.  Drink 2 to 3 quarts of water per day. 11.  Peripheral neuropathy-patient to call if numbness or tingling in hands or feet is persistent   Gave information on the supportive care team and how to contact them regarding services.  Discussed advanced directives.  The patient does not have their advanced directives but will look at the copy provided in their notebook and will call with any questions. [employer] Social worker Advanced directives  Answered questions to patient satisfaction.  Patient is to call with any further questions or concerns.  Dayton Scrape, FNP- [facility]

## 2020-10-17 NOTE — Telephone Encounter (Signed)
Per 4/29 LOS, patient scheduled for 5/6 Labs, Follow Up w/Dr Bobby Rumpf.  Gave patient Appt Summary

## 2020-10-20 DIAGNOSIS — J90 Pleural effusion, not elsewhere classified: Secondary | ICD-10-CM | POA: Diagnosis not present

## 2020-10-20 DIAGNOSIS — K5939 Other megacolon: Secondary | ICD-10-CM | POA: Diagnosis not present

## 2020-10-20 DIAGNOSIS — Z853 Personal history of malignant neoplasm of breast: Secondary | ICD-10-CM | POA: Diagnosis not present

## 2020-10-20 DIAGNOSIS — C50111 Malignant neoplasm of central portion of right female breast: Secondary | ICD-10-CM | POA: Diagnosis not present

## 2020-10-20 DIAGNOSIS — I313 Pericardial effusion (noninflammatory): Secondary | ICD-10-CM | POA: Diagnosis not present

## 2020-10-20 DIAGNOSIS — N1330 Unspecified hydronephrosis: Secondary | ICD-10-CM | POA: Diagnosis not present

## 2020-10-20 DIAGNOSIS — K6389 Other specified diseases of intestine: Secondary | ICD-10-CM | POA: Diagnosis not present

## 2020-10-21 ENCOUNTER — Other Ambulatory Visit: Payer: Self-pay | Admitting: Hematology and Oncology

## 2020-10-22 ENCOUNTER — Ambulatory Visit: Payer: PPO | Admitting: Oncology

## 2020-10-22 ENCOUNTER — Inpatient Hospital Stay: Payer: PPO

## 2020-10-23 ENCOUNTER — Other Ambulatory Visit: Payer: Self-pay | Admitting: Oncology

## 2020-10-23 NOTE — Progress Notes (Signed)
[place]  [address] [place],  [place]  [address] [phone]  Clinic Day:  10/24/2020  Referring physician: Greig Right, MD   HISTORY OF PRESENT ILLNESS:  The patient is a 60 y.o. female with metastatic hormone positive breast cancer, which includes spread of disease to her stomach, which was proven per an EGD with biopsy.  Recent CT scans showed no radiographic evidence of metastatic disease.  She had been taking everolimus/exemestane for her disease control over these past months.  However, after a 2nd opinion at [facility], they felt palliative chemotherapy was necessary.  She comes in today to review her CT scans before embarking upon palliative chemotherapy.  Clinically, the patient is doing okay.  However, she has begun having more abdominal pain that can be severe at times.  She still has bowel movements, but claims they have been more erratic than usual.    PHYSICAL EXAM:  Blood pressure (!) 156/84, pulse 84, temperature 98 F (36.7 C), resp. rate 16, height 5' 5.5" (1.664 m), weight 127 lb 8 oz (57.8 kg), SpO2 97 %. Wt Readings from Last 3 Encounters:  10/24/20 124 lb 1.3 oz (56.3 kg)  10/24/20 127 lb 8 oz (57.8 kg)  10/17/20 128 lb (58.1 kg)   Body mass index is 20.89 kg/m. Performance status (ECOG): 1 Physical Exam Constitutional:      Appearance: Normal appearance. She is not ill-appearing.  HENT:     Mouth/Throat:     Mouth: Mucous membranes are moist.     Pharynx: Oropharynx is clear. No oropharyngeal exudate or posterior oropharyngeal erythema.  Cardiovascular:     Rate and Rhythm: Normal rate and regular rhythm.     Heart sounds: No murmur heard. No friction rub. No gallop.   Pulmonary:     Effort: Pulmonary effort is normal. No respiratory distress.     Breath sounds: Normal breath sounds. No wheezing, rhonchi or rales.  Chest:  Breasts:     Right: No axillary adenopathy or supraclavicular adenopathy.     Left: No axillary  adenopathy or supraclavicular adenopathy.    Abdominal:     General: Bowel sounds are normal. There is no distension.     Palpations: Abdomen is soft. There is no mass.     Tenderness: There is no abdominal tenderness.  Musculoskeletal:        General: No swelling.     Right lower leg: No edema.     Left lower leg: No edema.  Lymphadenopathy:     Cervical: No cervical adenopathy.     Upper Body:     Right upper body: No supraclavicular or axillary adenopathy.     Left upper body: No supraclavicular or axillary adenopathy.     Lower Body: No right inguinal adenopathy. No left inguinal adenopathy.  Skin:    General: Skin is warm.     Coloration: Skin is not jaundiced.     Findings: No lesion or rash.  Neurological:     General: No focal deficit present.     Mental Status: She is alert and oriented to person, place, and time. Mental status is at baseline.     Cranial Nerves: Cranial nerves are intact.  Psychiatric:        Mood and Affect: Mood normal.        Behavior: Behavior normal.        Thought Content: Thought content normal.    SCANS:  Her chest CT revealed the following: FINDINGS:  CT CHEST FINDINGS  Cardiovascular: Heart size is normal. New small pericardial effusion. Aortic atherosclerosis.  Mediastinum/Nodes: Normal appearance of the thyroid gland. The trachea appears patent and is midline. Normal appearance of the esophagus. No enlarged axillary, supraclavicular, mediastinal or hilar lymph nodes.  Lungs/Pleura: Moderate right pleural effusion and small left pleural effusion are new from previous exam. Subpleural radiation change within the anterior lateral right apex.  Musculoskeletal: Postoperative changes from right mastectomy and right axillary node dissection. New healed fracture involving the anterior aspect of the left second rib, image 27/4. No acute or suspicious bone lesions identified.  CT ABDOMEN PELVIS FINDINGS  Hepatobiliary: No suspicious  liver lesion. Prior cholecystectomy. No bile duct dilatation identified.  Pancreas: Unremarkable. No pancreatic ductal dilatation or surrounding inflammatory changes.  Spleen: Normal in size without focal abnormality.  Adrenals/Urinary Tract: Normal adrenal glands. New focal area of low attenuation involving the anterolateral cortex of the left kidney with overlying cortical volume loss is noted, image 60/2. Persistent mild right hydronephrosis. No obstructing stone or mass noted. Urinary bladder appears collapsed.  Stomach/Bowel: Postoperative changes from right hemicolectomy. Progressive increase caliber of the small bowel loops now measure up to 4.2 cm. Previously 3.0 cm. There is a focal transition point within the central abdomen where dilated proximal small bowel loops abruptly decrease in caliber, image 32/601. There is gas and enteric contrast material distal to this transition point. Findings are suggestive of partial small bowel obstruction. This may be secondary to adhesive disease or internal hernia.  Vascular/Lymphatic: Aortic atherosclerosis. No retroperitoneal or pelvic adenopathy. No inguinal adenopathy.  Reproductive: Uterus and bilateral adnexa are unremarkable.  Other: New moderate volume of ascites noted within the abdomen and pelvis with mild overlying enhancement of the peritoneal reflection, as seen on image 107/2. No peritoneal nodule or mass identified. Partially loculated fluid is noted along the left pelvic sidewall and right posterior pelvis, image 107/2.  Musculoskeletal: Degenerative disc disease identified at L5-S1. No acute or suspicious osseous findings.  IMPRESSION: 1. No signs of adenopathy or solid organ metastasis within the chest, abdomen or pelvis. 2. New bilateral pleural effusions, right greater than left. A new small pericardial effusion is also present. 3. Postop change from right hemicolectomy with enterocolonic anastomosis. There  is progressive dilatation of the proximal small bowel loops with a transition point in the central abdomen where there is an abrupt decreased caliber of the small bowel. Findings are concerning for partial small bowel obstruction which may be due to adhesions, peritoneal disease, or internal hernia. 4. New moderate volume of ascites within the abdomen and pelvis with areas of overlying surrounding mild thickening and enhancement of the peritoneal reflection. Partially loculated fluid within the pelvis is also noted. Primary differential considerations include peritonitis versus peritoneal carcinomatosis. If there is a clinical concern for peritoneal carcinomatosis consider further evaluation with diagnostic paracentesis. 5. New focal area of low attenuation involving the anterolateral cortex of the left kidney with overlying cortical volume loss. This is favored to represent an area of infarct. 6. Aortic atherosclerosis.  Aortic Atherosclerosis (ICD10-I70.0).  LABS:  Ref. Range 10/24/2020 00:00  Sodium Latest Ref Range: 137 - 147  136 (A)  Potassium Latest Ref Range: 3.4 - 5.3  3.9  Chloride Latest Ref Range: 99 - 108  105  CO2 Latest Ref Range: 13 - 22  28 (A)  Glucose Unknown 102  BUN Latest Ref Range: 4 - 21  18  Creatinine Latest Ref Range: 0.5 - 1.1  0.9  Calcium  Latest Ref Range: 8.7 - 10.7  8.2 (A)  Alkaline Phosphatase Latest Ref Range: 25 - 125  103  Albumin Latest Ref Range: 3.5 - 5.0  2.9 (A)  AST Latest Ref Range: 13 - 35  24  ALT Latest Ref Range: 7 - 35  12  Bilirubin, Total Unknown 0.6  WBC Unknown 7.4  RBC Latest Ref Range: 3.87 - 5.11  3.61 (A)  Hemoglobin Latest Ref Range: 12.0 - 16.0  11.4 (A)  HCT Latest Ref Range: 36 - 46  34 (A)  Platelets Latest Ref Range: 150 - 399  294  NEUT# Unknown 5.03   ASSESSMENT & PLAN:  Assessment/Plan:  A 60 y.o. female with metastatic hormone positive breast cancer, including spread of disease to her stomach.  In clinic today,  I went over her CT scan images with her, for which she could see she has pleural effusions.  There is also a very small pericardial effusion, as well as a moderate degree of ascites.  She also appears to have a partial small bowel obstruction.  She understands that all of these findings are likely consistent with progression of her metastatic disease.  Ideally, I would have her undergo a diagnostic thoracentesis and paracentesis.  However, as she is scheduled for chemotherapy today and the window of opportunity for her getting chemotherapy may close over time, such procedures will be held for now.  If these effusions persist over time, fluid sampling will be done.  She will proceed with her 1st cycle of carboplatin/gemcitabine today.  Carboplatin will be given on day 1; gemcitabine will be given on days 1 and 8.  She was made aware of the side effects that go along with this regimen, including nausea, thrombocytopenia, and fatigue.  I will see her back in 3 weeks before she heads into her 2nd cycle of carboplatin/gemcitabine.  The patient understands all the plans discussed today and is in agreement with them.  Sol Englert Macarthur Critchley, MD

## 2020-10-24 ENCOUNTER — Other Ambulatory Visit: Payer: Self-pay | Admitting: Oncology

## 2020-10-24 ENCOUNTER — Other Ambulatory Visit: Payer: Self-pay | Admitting: Hematology and Oncology

## 2020-10-24 ENCOUNTER — Inpatient Hospital Stay: Payer: PPO

## 2020-10-24 ENCOUNTER — Inpatient Hospital Stay (INDEPENDENT_AMBULATORY_CARE_PROVIDER_SITE_OTHER): Payer: PPO | Admitting: Oncology

## 2020-10-24 ENCOUNTER — Other Ambulatory Visit: Payer: Self-pay

## 2020-10-24 ENCOUNTER — Inpatient Hospital Stay: Payer: PPO | Attending: Oncology

## 2020-10-24 VITALS — BP 135/70 | HR 80 | Temp 97.9°F | Resp 16 | Wt 124.1 lb

## 2020-10-24 VITALS — BP 156/84 | HR 84 | Temp 98.0°F | Resp 16 | Ht 65.5 in | Wt 127.5 lb

## 2020-10-24 DIAGNOSIS — C785 Secondary malignant neoplasm of large intestine and rectum: Secondary | ICD-10-CM | POA: Diagnosis not present

## 2020-10-24 DIAGNOSIS — Z170 Estrogen receptor positive status [ER+]: Secondary | ICD-10-CM

## 2020-10-24 DIAGNOSIS — C7889 Secondary malignant neoplasm of other digestive organs: Secondary | ICD-10-CM | POA: Insufficient documentation

## 2020-10-24 DIAGNOSIS — C50111 Malignant neoplasm of central portion of right female breast: Secondary | ICD-10-CM

## 2020-10-24 DIAGNOSIS — Z5111 Encounter for antineoplastic chemotherapy: Secondary | ICD-10-CM | POA: Insufficient documentation

## 2020-10-24 DIAGNOSIS — D649 Anemia, unspecified: Secondary | ICD-10-CM | POA: Diagnosis not present

## 2020-10-24 DIAGNOSIS — C50919 Malignant neoplasm of unspecified site of unspecified female breast: Secondary | ICD-10-CM | POA: Insufficient documentation

## 2020-10-24 DIAGNOSIS — R197 Diarrhea, unspecified: Secondary | ICD-10-CM

## 2020-10-24 LAB — BASIC METABOLIC PANEL
BUN: 18 (ref 4–21)
CO2: 28 — AB (ref 13–22)
Chloride: 105 (ref 99–108)
Creatinine: 0.9 (ref 0.5–1.1)
Glucose: 102
Potassium: 3.9 (ref 3.4–5.3)
Sodium: 136 — AB (ref 137–147)

## 2020-10-24 LAB — HEPATIC FUNCTION PANEL
ALT: 12 (ref 7–35)
AST: 24 (ref 13–35)
Alkaline Phosphatase: 103 (ref 25–125)
Bilirubin, Total: 0.6

## 2020-10-24 LAB — CBC AND DIFFERENTIAL
HCT: 34 — AB (ref 36–46)
Hemoglobin: 11.4 — AB (ref 12.0–16.0)
Neutrophils Absolute: 5.03
Platelets: 294 (ref 150–399)
WBC: 7.4

## 2020-10-24 LAB — COMPREHENSIVE METABOLIC PANEL
Albumin: 2.9 — AB (ref 3.5–5.0)
Calcium: 8.2 — AB (ref 8.7–10.7)

## 2020-10-24 LAB — CBC: RBC: 3.61 — AB (ref 3.87–5.11)

## 2020-10-24 MED ORDER — SODIUM CHLORIDE 0.9 % IV SOLN
370.0000 mg | Freq: Once | INTRAVENOUS | Status: AC
  Administered 2020-10-24: 370 mg via INTRAVENOUS
  Filled 2020-10-24: qty 37

## 2020-10-24 MED ORDER — SODIUM CHLORIDE 0.9 % IV SOLN
800.0000 mg/m2 | Freq: Once | INTRAVENOUS | Status: AC
  Administered 2020-10-24: 1330 mg via INTRAVENOUS
  Filled 2020-10-24: qty 26.3

## 2020-10-24 MED ORDER — SODIUM CHLORIDE 0.9 % IV SOLN
Freq: Once | INTRAVENOUS | Status: DC
  Filled 2020-10-24: qty 250

## 2020-10-24 MED ORDER — SODIUM CHLORIDE 0.9 % IV SOLN
10.0000 mg | Freq: Once | INTRAVENOUS | Status: AC
  Administered 2020-10-24: 10 mg via INTRAVENOUS
  Filled 2020-10-24: qty 10

## 2020-10-24 MED ORDER — OCTREOTIDE ACETATE 30 MG IM KIT
PACK | INTRAMUSCULAR | Status: AC
  Filled 2020-10-24: qty 1

## 2020-10-24 MED ORDER — SODIUM CHLORIDE 0.9 % IV SOLN
Freq: Once | INTRAVENOUS | Status: AC
  Filled 2020-10-24: qty 250

## 2020-10-24 MED ORDER — HEPARIN SOD (PORK) LOCK FLUSH 100 UNIT/ML IV SOLN
500.0000 [IU] | Freq: Once | INTRAVENOUS | Status: AC | PRN
Start: 2020-10-24 — End: 2020-10-24
  Administered 2020-10-24: 500 [IU]
  Filled 2020-10-24: qty 5

## 2020-10-24 MED ORDER — PALONOSETRON HCL INJECTION 0.25 MG/5ML
0.2500 mg | Freq: Once | INTRAVENOUS | Status: AC
  Administered 2020-10-24: 0.25 mg via INTRAVENOUS

## 2020-10-24 MED ORDER — PALONOSETRON HCL INJECTION 0.25 MG/5ML
INTRAVENOUS | Status: AC
  Filled 2020-10-24: qty 5

## 2020-10-24 MED ORDER — SODIUM CHLORIDE 0.9 % IV SOLN
150.0000 mg | Freq: Once | INTRAVENOUS | Status: AC
  Administered 2020-10-24: 150 mg via INTRAVENOUS
  Filled 2020-10-24: qty 150

## 2020-10-24 MED ORDER — OCTREOTIDE ACETATE 30 MG IM KIT
30.0000 mg | PACK | Freq: Once | INTRAMUSCULAR | Status: AC
  Administered 2020-10-24: 30 mg via INTRAMUSCULAR

## 2020-10-24 NOTE — Progress Notes (Signed)
Pt d/c stable at 1347

## 2020-10-24 NOTE — Progress Notes (Signed)
10/17/20 Scr 1.1, This is the dose her Carboplatin (370mg  - AUC 5) dose is based on.. Her Scr today is 0.9. Thus her Carboplatin calculates to be 420mg . Discussed with DR. Daine Gravel, PharmD ok to proceed with Carboplatin 370mg  (Scr 0.9, AUC 4.5) today.

## 2020-10-24 NOTE — Progress Notes (Signed)
Proceed with Carboplatin 370mg  (Scr 0.9, AUC 4.5) per MD.  Acquanetta Belling, RPH, BCPS, BCOP 10/24/2020 10:47 AM

## 2020-10-24 NOTE — Patient Instructions (Signed)
[facility]  Discharge Instructions: Thank you for choosing [facility] to provide your oncology and hematology care.  If you have a lab appointment with the [facility], please go directly to the [facility] and check in at the registration area.   Wear comfortable clothing and clothing appropriate for easy access to any Portacath or PICC line.   We strive to give you quality time with your provider. You may need to reschedule your appointment if you arrive late (15 or more minutes).  Arriving late affects you and other patients whose appointments are after yours.  Also, if you miss three or more appointments without notifying the office, you may be dismissed from the clinic at the provider's discretion.      For prescription refill requests, have your pharmacy contact our office and allow 72 hours for refills to be completed.    Today you received the following chemotherapy and/or immunotherapy agents Gemzar/carboplatin     To help prevent nausea and vomiting after your treatment, we encourage you to take your nausea medication as directed.  BELOW ARE SYMPTOMS THAT SHOULD BE REPORTED IMMEDIATELY: . *FEVER GREATER THAN 100.4 F (38 C) OR HIGHER . *CHILLS OR SWEATING . *NAUSEA AND VOMITING THAT IS NOT CONTROLLED WITH YOUR NAUSEA MEDICATION . *UNUSUAL SHORTNESS OF BREATH . *UNUSUAL BRUISING OR BLEEDING . *URINARY PROBLEMS (pain or burning when urinating, or frequent urination) . *BOWEL PROBLEMS (unusual diarrhea, constipation, pain near the anus) . TENDERNESS IN MOUTH AND THROAT WITH OR WITHOUT PRESENCE OF ULCERS (sore throat, sores in mouth, or a toothache) . UNUSUAL RASH, SWELLING OR PAIN  . UNUSUAL VAGINAL DISCHARGE OR ITCHING   Items with * indicate a potential emergency and should be followed up as soon as possible or go to the Emergency Department if any problems should occur.  Please show the CHEMOTHERAPY ALERT CARD or IMMUNOTHERAPY ALERT  CARD at check-in to the Emergency Department and triage nurse.  Should you have questions after your visit or need to cancel or reschedule your appointment, please contact [facility]  Dept: [phone]  and follow the prompts.  Office hours are 8:00 a.m. to 4:30 p.m. Monday - Friday. Please note that voicemails left after 4:00 p.m. may not be returned until the following business day.  We are closed weekends and major holidays. You have access to a nurse at all times for urgent questions. Please call the main number to the clinic Dept: [phone] and follow the prompts.  For any non-urgent questions, you may also contact your provider using MyChart. We now offer e-Visits for anyone 67 and older to request care online for non-urgent symptoms. For details visit [email or web address].   Also download the MyChart app! Go to the app store, search "MyChart", open the app, select [facility], and log in with your MyChart username and password.  Due to Covid, a mask is required upon entering the hospital/clinic. If you do not have a mask, one will be given to you upon arrival. For doctor visits, patients may have 1 support person aged 23 or older with them. For treatment visits, patients cannot have anyone with them due to current Covid guidelines and our immunocompromised population.   Octreotide injection solution What is this medicine? OCTREOTIDE (ok TREE oh tide) is used to reduce blood levels of growth hormone in patients with a condition called acromegaly. This medicine also reduces flushing and watery diarrhea caused by certain types of cancer.  This medicine may be used for other purposes; ask your health care provider or pharmacist if you have questions. COMMON BRAND NAME(S): Leatha Gilding, Sandostatin What should I tell my health care provider before I take this medicine? They need to know if you have any of these conditions:  diabetes  gallbladder disease  kidney  disease  liver disease  thyroid disease  an unusual or allergic reaction to octreotide, other medicines, foods, dyes, or preservatives  pregnant or trying to get pregnant  breast-feeding How should I use this medicine? This medicine is for injection under the skin or into a vein (only in emergency situations). It is usually given by a health care professional in a hospital or clinic setting. If you get this medicine at home, you will be taught how to prepare and give this medicine. Allow the injection solution to come to room temperature before use. Do not warm it artificially. Use exactly as directed. Take your medicine at regular intervals. Do not take your medicine more often than directed. It is important that you put your used needles and syringes in a special sharps container. Do not put them in a trash can. If you do not have a sharps container, call your pharmacist or healthcare provider to get one. Talk to your pediatrician regarding the use of this medicine in children. Special care may be needed. Overdosage: If you think you have taken too much of this medicine contact a poison control center or emergency room at once. NOTE: This medicine is only for you. Do not share this medicine with others. What if I miss a dose? If you miss a dose, take it as soon as you can. If it is almost time for your next dose, take only that dose. Do not take double or extra doses. What may interact with this medicine?  bromocriptine  certain medicines for blood pressure, heart disease, irregular heartbeat  cyclosporine  diuretics  medicines for diabetes, including insulin  quinidine This list may not describe all possible interactions. Give your health care provider a list of all the medicines, herbs, non-prescription drugs, or dietary supplements you use. Also tell them if you smoke, drink alcohol, or use illegal drugs. Some items may interact with your medicine. What should I watch for while  using this medicine? Visit your doctor or health care professional for regular checks on your progress. To help reduce irritation at the injection site, use a different site for each injection and make sure the solution is at room temperature before use. This medicine may cause decreases in blood sugar. Signs of low blood sugar include chills, cool, pale skin or cold sweats, drowsiness, extreme hunger, fast heartbeat, headache, nausea, nervousness or anxiety, shakiness, trembling, unsteadiness, tiredness, or weakness. Contact your doctor or health care professional right away if you experience any of these symptoms. This medicine may increase blood sugar. Ask your healthcare provider if changes in diet or medicines are needed if you have diabetes. This medicine may cause a decrease in vitamin B12. You should make sure that you get enough vitamin B12 while you are taking this medicine. Discuss the foods you eat and the vitamins you take with your health care professional. What side effects may I notice from receiving this medicine? Side effects that you should report to your doctor or health care professional as soon as possible:  allergic reactions like skin rash, itching or hives, swelling of the face, lips, or tongue  fast, slow, or irregular heartbeat  right upper  belly pain  severe stomach pain  signs and symptoms of high blood sugar such as being more thirsty or hungry or having to urinate more than normal. You may also feel very tired or have blurry vision.  signs and symptoms of low blood sugar such as feeling anxious; confusion; dizziness; increased hunger; unusually weak or tired; increased sweating; shakiness; cold, clammy skin; irritable; headache; blurred vision; fast heartbeat; loss of consciousness  unusually weak or tired Side effects that usually do not require medical attention (report to your doctor or health care professional if they continue or are  bothersome):  diarrhea  dizziness  gas  headache  nausea, vomiting  pain, redness, or irritation at site where injected  upset stomach This list may not describe all possible side effects. Call your doctor for medical advice about side effects. You may report side effects to FDA at 1-800-FDA-1088. Where should I keep my medicine? Keep out of the reach of children. Store in a refrigerator between 2 and 8 degrees C (36 and 46 degrees F). Protect from light. Allow to come to room temperature naturally. Do not use artificial heat. If protected from light, the injection may be stored at room temperature between 20 and 30 degrees C (70 and 86 degrees F) for 14 days. After the initial use, throw away any unused portion of a multiple dose vial after 14 days. Throw away unused portions of the ampules after use. NOTE: This sheet is a summary. It may not cover all possible information. If you have questions about this medicine, talk to your doctor, pharmacist, or health care provider.  2021 Elsevier/Gold Standard (2019-01-04 13:33:09) Carboplatin injection What is this medicine? CARBOPLATIN (KAR boe pla tin) is a chemotherapy drug. It targets fast dividing cells, like cancer cells, and causes these cells to die. This medicine is used to treat ovarian cancer and many other cancers. This medicine may be used for other purposes; ask your health care provider or pharmacist if you have questions. COMMON BRAND NAME(S): Paraplatin What should I tell my health care provider before I take this medicine? They need to know if you have any of these conditions:  blood disorders  hearing problems  kidney disease  recent or ongoing radiation therapy  an unusual or allergic reaction to carboplatin, cisplatin, other chemotherapy, other medicines, foods, dyes, or preservatives  pregnant or trying to get pregnant  breast-feeding How should I use this medicine? This drug is usually given as an infusion  into a vein. It is administered in a hospital or clinic by a specially trained health care professional. Talk to your pediatrician regarding the use of this medicine in children. Special care may be needed. Overdosage: If you think you have taken too much of this medicine contact a poison control center or emergency room at once. NOTE: This medicine is only for you. Do not share this medicine with others. What if I miss a dose? It is important not to miss a dose. Call your doctor or health care professional if you are unable to keep an appointment. What may interact with this medicine?  medicines for seizures  medicines to increase blood counts like filgrastim, pegfilgrastim, sargramostim  some antibiotics like amikacin, gentamicin, neomycin, streptomycin, tobramycin  vaccines Talk to your doctor or health care professional before taking any of these medicines:  acetaminophen  aspirin  ibuprofen  ketoprofen  naproxen This list may not describe all possible interactions. Give your health care provider a list of all the medicines,  herbs, non-prescription drugs, or dietary supplements you use. Also tell them if you smoke, drink alcohol, or use illegal drugs. Some items may interact with your medicine. What should I watch for while using this medicine? Your condition will be monitored carefully while you are receiving this medicine. You will need important blood work done while you are taking this medicine. This drug may make you feel generally unwell. This is not uncommon, as chemotherapy can affect healthy cells as well as cancer cells. Report any side effects. Continue your course of treatment even though you feel ill unless your doctor tells you to stop. In some cases, you may be given additional medicines to help with side effects. Follow all directions for their use. Call your doctor or health care professional for advice if you get a fever, chills or sore throat, or other symptoms  of a cold or flu. Do not treat yourself. This drug decreases your body's ability to fight infections. Try to avoid being around people who are sick. This medicine may increase your risk to bruise or bleed. Call your doctor or health care professional if you notice any unusual bleeding. Be careful brushing and flossing your teeth or using a toothpick because you may get an infection or bleed more easily. If you have any dental work done, tell your dentist you are receiving this medicine. Avoid taking products that contain aspirin, acetaminophen, ibuprofen, naproxen, or ketoprofen unless instructed by your doctor. These medicines may hide a fever. Do not become pregnant while taking this medicine. Women should inform their doctor if they wish to become pregnant or think they might be pregnant. There is a potential for serious side effects to an unborn child. Talk to your health care professional or pharmacist for more information. Do not breast-feed an infant while taking this medicine. What side effects may I notice from receiving this medicine? Side effects that you should report to your doctor or health care professional as soon as possible:  allergic reactions like skin rash, itching or hives, swelling of the face, lips, or tongue  signs of infection - fever or chills, cough, sore throat, pain or difficulty passing urine  signs of decreased platelets or bleeding - bruising, pinpoint red spots on the skin, black, tarry stools, nosebleeds  signs of decreased red blood cells - unusually weak or tired, fainting spells, lightheadedness  breathing problems  changes in hearing  changes in vision  chest pain  high blood pressure  low blood counts - This drug may decrease the number of white blood cells, red blood cells and platelets. You may be at increased risk for infections and bleeding.  nausea and vomiting  pain, swelling, redness or irritation at the injection site  pain, tingling,  numbness in the hands or feet  problems with balance, talking, walking  trouble passing urine or change in the amount of urine Side effects that usually do not require medical attention (report to your doctor or health care professional if they continue or are bothersome):  hair loss  loss of appetite  metallic taste in the mouth or changes in taste This list may not describe all possible side effects. Call your doctor for medical advice about side effects. You may report side effects to FDA at 1-800-FDA-1088. Where should I keep my medicine? This drug is given in a hospital or clinic and will not be stored at home. NOTE: This sheet is a summary. It may not cover all possible information. If you have questions  about this medicine, talk to your doctor, pharmacist, or health care provider.  2021 Elsevier/Gold Standard (2007-09-12 14:38:05) Gemcitabine injection What is this medicine? GEMCITABINE (jem SYE ta been) is a chemotherapy drug. This medicine is used to treat many types of cancer like breast cancer, lung cancer, pancreatic cancer, and ovarian cancer. This medicine may be used for other purposes; ask your health care provider or pharmacist if you have questions. COMMON BRAND NAME(S): Gemzar, Infugem What should I tell my health care provider before I take this medicine? They need to know if you have any of these conditions:  blood disorders  infection  kidney disease  liver disease  lung or breathing disease, like asthma  recent or ongoing radiation therapy  an unusual or allergic reaction to gemcitabine, other chemotherapy, other medicines, foods, dyes, or preservatives  pregnant or trying to get pregnant  breast-feeding How should I use this medicine? This drug is given as an infusion into a vein. It is administered in a hospital or clinic by a specially trained health care professional. Talk to your pediatrician regarding the use of this medicine in children.  Special care may be needed. Overdosage: If you think you have taken too much of this medicine contact a poison control center or emergency room at once. NOTE: This medicine is only for you. Do not share this medicine with others. What if I miss a dose? It is important not to miss your dose. Call your doctor or health care professional if you are unable to keep an appointment. What may interact with this medicine?  medicines to increase blood counts like filgrastim, pegfilgrastim, sargramostim  some other chemotherapy drugs like cisplatin  vaccines Talk to your doctor or health care professional before taking any of these medicines:  acetaminophen  aspirin  ibuprofen  ketoprofen  naproxen This list may not describe all possible interactions. Give your health care provider a list of all the medicines, herbs, non-prescription drugs, or dietary supplements you use. Also tell them if you smoke, drink alcohol, or use illegal drugs. Some items may interact with your medicine. What should I watch for while using this medicine? Visit your doctor for checks on your progress. This drug may make you feel generally unwell. This is not uncommon, as chemotherapy can affect healthy cells as well as cancer cells. Report any side effects. Continue your course of treatment even though you feel ill unless your doctor tells you to stop. In some cases, you may be given additional medicines to help with side effects. Follow all directions for their use. Call your doctor or health care professional for advice if you get a fever, chills or sore throat, or other symptoms of a cold or flu. Do not treat yourself. This drug decreases your body's ability to fight infections. Try to avoid being around people who are sick. This medicine may increase your risk to bruise or bleed. Call your doctor or health care professional if you notice any unusual bleeding. Be careful brushing and flossing your teeth or using a  toothpick because you may get an infection or bleed more easily. If you have any dental work done, tell your dentist you are receiving this medicine. Avoid taking products that contain aspirin, acetaminophen, ibuprofen, naproxen, or ketoprofen unless instructed by your doctor. These medicines may hide a fever. Do not become pregnant while taking this medicine or for 6 months after stopping it. Women should inform their doctor if they wish to become pregnant or think they might  be pregnant. Men should not father a child while taking this medicine and for 3 months after stopping it. There is a potential for serious side effects to an unborn child. Talk to your health care professional or pharmacist for more information. Do not breast-feed an infant while taking this medicine or for at least 1 week after stopping it. Men should inform their doctors if they wish to father a child. This medicine may lower sperm counts. Talk with your doctor or health care professional if you are concerned about your fertility. What side effects may I notice from receiving this medicine? Side effects that you should report to your doctor or health care professional as soon as possible:  allergic reactions like skin rash, itching or hives, swelling of the face, lips, or tongue  breathing problems  pain, redness, or irritation at site where injected  signs and symptoms of a dangerous change in heartbeat or heart rhythm like chest pain; dizziness; fast or irregular heartbeat; palpitations; feeling faint or lightheaded, falls; breathing problems  signs of decreased platelets or bleeding - bruising, pinpoint red spots on the skin, black, tarry stools, blood in the urine  signs of decreased red blood cells - unusually weak or tired, feeling faint or lightheaded, falls  signs of infection - fever or chills, cough, sore throat, pain or difficulty passing urine  signs and symptoms of kidney injury like trouble passing urine or  change in the amount of urine  signs and symptoms of liver injury like dark yellow or brown urine; general ill feeling or flu-like symptoms; light-colored stools; loss of appetite; nausea; right upper belly pain; unusually weak or tired; yellowing of the eyes or skin  swelling of ankles, feet, hands Side effects that usually do not require medical attention (report to your doctor or health care professional if they continue or are bothersome):  constipation  diarrhea  hair loss  loss of appetite  nausea  rash  vomiting This list may not describe all possible side effects. Call your doctor for medical advice about side effects. You may report side effects to FDA at 1-800-FDA-1088. Where should I keep my medicine? This drug is given in a hospital or clinic and will not be stored at home. NOTE: This sheet is a summary. It may not cover all possible information. If you have questions about this medicine, talk to your doctor, pharmacist, or health care provider.  2021 Elsevier/Gold Standard (2017-08-31 18:06:11)

## 2020-10-27 ENCOUNTER — Telehealth: Payer: Self-pay

## 2020-10-27 NOTE — Telephone Encounter (Signed)
I spoke with Mrs Brier. She states she is doing good. She denies N/V, fevers, and skin irritation/changes. She is having diarrhea and is taking Imodium. I reminded her that she can take the Imodium up to 8 tabs per day. 2 tabs after the first diarrhea episode each morning, then 1 tab after each diarrhea episode thereafter. Pt states she is drinking power aids to maintain fluid balance and replace the minerals she is loosing because of the diarrhea. I reminded her of the importance of CALLING us WITH TEMP OF 100.4 OR HIGHER, DAY OR NIGHT. Pt verbalizes understanding. Appt confirmed for later this week.

## 2020-10-30 ENCOUNTER — Encounter: Payer: Self-pay | Admitting: Hematology and Oncology

## 2020-10-30 ENCOUNTER — Inpatient Hospital Stay: Payer: PPO

## 2020-10-30 ENCOUNTER — Other Ambulatory Visit: Payer: Self-pay

## 2020-10-30 ENCOUNTER — Other Ambulatory Visit: Payer: Self-pay | Admitting: Pharmacist

## 2020-10-30 DIAGNOSIS — C785 Secondary malignant neoplasm of large intestine and rectum: Secondary | ICD-10-CM

## 2020-10-30 DIAGNOSIS — C50111 Malignant neoplasm of central portion of right female breast: Secondary | ICD-10-CM | POA: Diagnosis not present

## 2020-10-30 DIAGNOSIS — D649 Anemia, unspecified: Secondary | ICD-10-CM | POA: Diagnosis not present

## 2020-10-30 LAB — COMPREHENSIVE METABOLIC PANEL
Albumin: 3.5 (ref 3.5–5.0)
Calcium: 8.2 — AB (ref 8.7–10.7)

## 2020-10-30 LAB — BASIC METABOLIC PANEL
BUN: 18 (ref 4–21)
CO2: 30 — AB (ref 13–22)
Chloride: 103 (ref 99–108)
Creatinine: 0.9 (ref 0.5–1.1)
Glucose: 105
Potassium: 3.8 (ref 3.4–5.3)
Sodium: 135 — AB (ref 137–147)

## 2020-10-30 LAB — CBC AND DIFFERENTIAL
HCT: 30 — AB (ref 36–46)
Hemoglobin: 9.5 — AB (ref 12.0–16.0)
Neutrophils Absolute: 1.89
Platelets: 246 (ref 150–399)
WBC: 2.7

## 2020-10-30 LAB — HEPATIC FUNCTION PANEL
ALT: 86 — AB (ref 7–35)
AST: 151 — AB (ref 13–35)
Alkaline Phosphatase: 242 — AB (ref 25–125)
Bilirubin, Total: 0.7

## 2020-10-30 LAB — CBC: RBC: 3.1 — AB (ref 3.87–5.11)

## 2020-10-30 NOTE — Progress Notes (Signed)
Proceed with day 8 gemcitabine on 5/13 despite elevated LFTs per Dr. Bobby Rumpf.  Dr. Bobby Rumpf also wants the patient to have pegfilgrastim this cycle due to Carrizo trending down.

## 2020-10-31 ENCOUNTER — Inpatient Hospital Stay: Payer: PPO

## 2020-10-31 VITALS — BP 138/82 | HR 85 | Temp 98.2°F | Resp 18 | Ht 65.5 in | Wt 125.1 lb

## 2020-10-31 DIAGNOSIS — Z5111 Encounter for antineoplastic chemotherapy: Secondary | ICD-10-CM | POA: Diagnosis not present

## 2020-10-31 DIAGNOSIS — C50111 Malignant neoplasm of central portion of right female breast: Secondary | ICD-10-CM

## 2020-10-31 MED ORDER — ONDANSETRON HCL 4 MG/2ML IJ SOLN
8.0000 mg | Freq: Once | INTRAMUSCULAR | Status: AC
Start: 2020-10-31 — End: 2020-10-31
  Administered 2020-10-31: 8 mg via INTRAVENOUS
  Filled 2020-10-31: qty 4

## 2020-10-31 MED ORDER — SODIUM CHLORIDE 0.9 % IV SOLN
Freq: Once | INTRAVENOUS | Status: DC
  Filled 2020-10-31: qty 250

## 2020-10-31 MED ORDER — SODIUM CHLORIDE 0.9 % IV SOLN
800.0000 mg/m2 | Freq: Once | INTRAVENOUS | Status: AC
  Administered 2020-10-31: 1330 mg via INTRAVENOUS
  Filled 2020-10-31: qty 26.3

## 2020-10-31 MED ORDER — HEPARIN SOD (PORK) LOCK FLUSH 100 UNIT/ML IV SOLN
500.0000 [IU] | Freq: Once | INTRAVENOUS | Status: DC | PRN
  Filled 2020-10-31: qty 5

## 2020-10-31 MED ORDER — SODIUM CHLORIDE 0.9 % IV SOLN
Freq: Once | INTRAVENOUS | Status: AC
  Filled 2020-10-31: qty 250

## 2020-10-31 MED ORDER — ONDANSETRON HCL 4 MG/2ML IJ SOLN
INTRAMUSCULAR | Status: AC
  Filled 2020-10-31: qty 4

## 2020-11-03 ENCOUNTER — Inpatient Hospital Stay: Payer: PPO

## 2020-11-03 ENCOUNTER — Encounter: Payer: Self-pay | Admitting: Oncology

## 2020-11-03 ENCOUNTER — Other Ambulatory Visit: Payer: Self-pay

## 2020-11-03 VITALS — BP 129/74 | HR 83 | Temp 98.1°F | Resp 18 | Ht 65.5 in | Wt 124.0 lb

## 2020-11-03 DIAGNOSIS — C50111 Malignant neoplasm of central portion of right female breast: Secondary | ICD-10-CM

## 2020-11-03 DIAGNOSIS — Z5111 Encounter for antineoplastic chemotherapy: Secondary | ICD-10-CM | POA: Diagnosis not present

## 2020-11-03 MED ORDER — PEGFILGRASTIM-JMDB 6 MG/0.6ML ~~LOC~~ SOSY
PREFILLED_SYRINGE | SUBCUTANEOUS | Status: AC
  Filled 2020-11-03: qty 0.6

## 2020-11-03 MED ORDER — PEGFILGRASTIM-JMDB 6 MG/0.6ML ~~LOC~~ SOSY
6.0000 mg | PREFILLED_SYRINGE | Freq: Once | SUBCUTANEOUS | Status: AC
  Administered 2020-11-03: 6 mg via SUBCUTANEOUS

## 2020-11-03 NOTE — Progress Notes (Signed)
Completed on 5/13/ at 1510 discharge planning

## 2020-11-03 NOTE — Patient Instructions (Signed)
[facility]  Discharge Instructions: Thank you for choosing [facility] to provide your oncology and hematology care.  If you have a lab appointment with the [facility], please go directly to the [facility] and check in at the registration area.   Wear comfortable clothing and clothing appropriate for easy access to any Portacath or PICC line.   We strive to give you quality time with your provider. You may need to reschedule your appointment if you arrive late (15 or more minutes).  Arriving late affects you and other patients whose appointments are after yours.  Also, if you miss three or more appointments without notifying the office, you may be dismissed from the clinic at the provider's discretion.      For prescription refill requests, have your pharmacy contact our office and allow 72 hours for refills to be completed.    Today you received the following chemotherapy and/or immunotherapy agents Gemzar   To help prevent nausea and vomiting after your treatment, we encourage you to take your nausea medication as directed.  BELOW ARE SYMPTOMS THAT SHOULD BE REPORTED IMMEDIATELY: . *FEVER GREATER THAN 100.4 F (38 C) OR HIGHER . *CHILLS OR SWEATING . *NAUSEA AND VOMITING THAT IS NOT CONTROLLED WITH YOUR NAUSEA MEDICATION . *UNUSUAL SHORTNESS OF BREATH . *UNUSUAL BRUISING OR BLEEDING . *URINARY PROBLEMS (pain or burning when urinating, or frequent urination) . *BOWEL PROBLEMS (unusual diarrhea, constipation, pain near the anus) . TENDERNESS IN MOUTH AND THROAT WITH OR WITHOUT PRESENCE OF ULCERS (sore throat, sores in mouth, or a toothache) . UNUSUAL RASH, SWELLING OR PAIN  . UNUSUAL VAGINAL DISCHARGE OR ITCHING   Items with * indicate a potential emergency and should be followed up as soon as possible or go to the Emergency Department if any problems should occur.  Please show the CHEMOTHERAPY ALERT CARD or IMMUNOTHERAPY ALERT CARD at  check-in to the Emergency Department and triage nurse.  Should you have questions after your visit or need to cancel or reschedule your appointment, please contact [facility]  Dept: [phone]  and follow the prompts.  Office hours are 8:00 a.m. to 4:30 p.m. Monday - Friday. Please note that voicemails left after 4:00 p.m. may not be returned until the following business day.  We are closed weekends and major holidays. You have access to a nurse at all times for urgent questions. Please call the main number to the clinic Dept: [phone] and follow the prompts.  For any non-urgent questions, you may also contact your provider using MyChart. We now offer e-Visits for anyone 33 and older to request care online for non-urgent symptoms. For details visit [email or web address].   Also download the MyChart app! Go to the app store, search "MyChart", open the app, select [facility], and log in with your MyChart username and password.  Due to Covid, a mask is required upon entering the hospital/clinic. If you do not have a mask, one will be given to you upon arrival. For doctor visits, patients may have 1 support person aged 47 or older with them. For treatment visits, patients cannot have anyone with them due to current Covid guidelines and our immunocompromised population.   Gemcitabine injection What is this medicine? GEMCITABINE (jem SYE ta been) is a chemotherapy drug. This medicine is used to treat many types of cancer like breast cancer, lung cancer, pancreatic cancer, and ovarian cancer. This medicine may be used for other purposes; ask  other purposes; ask your health care provider or pharmacist if you have questions. COMMON BRAND NAME(S): Gemzar, Infugem What should I tell my health care provider before I take this medicine? They need to know if you have any of these conditions:  blood disorders  infection  kidney disease  liver disease  lung or breathing disease, like  asthma  recent or ongoing radiation therapy  an unusual or allergic reaction to gemcitabine, other chemotherapy, other medicines, foods, dyes, or preservatives  pregnant or trying to get pregnant  breast-feeding How should I use this medicine? This drug is given as an infusion into a vein. It is administered in a hospital or clinic by a specially trained health care professional. Talk to your pediatrician regarding the use of this medicine in children. Special care may be needed. Overdosage: If you think you have taken too much of this medicine contact a poison control center or emergency room at once. NOTE: This medicine is only for you. Do not share this medicine with others. What if I miss a dose? It is important not to miss your dose. Call your doctor or health care professional if you are unable to keep an appointment. What may interact with this medicine?  medicines to increase blood counts like filgrastim, pegfilgrastim, sargramostim  some other chemotherapy drugs like cisplatin  vaccines Talk to your doctor or health care professional before taking any of these medicines:  acetaminophen  aspirin  ibuprofen  ketoprofen  naproxen This list may not describe all possible interactions. Give your health care provider a list of all the medicines, herbs, non-prescription drugs, or dietary supplements you use. Also tell them if you smoke, drink alcohol, or use illegal drugs. Some items may interact with your medicine. What should I watch for while using this medicine? Visit your doctor for checks on your progress. This drug may make you feel generally unwell. This is not uncommon, as chemotherapy can affect healthy cells as well as cancer cells. Report any side effects. Continue your course of treatment even though you feel ill unless your doctor tells you to stop. In some cases, you may be given additional medicines to help with side effects. Follow all directions for their  use. Call your doctor or health care professional for advice if you get a fever, chills or sore throat, or other symptoms of a cold or flu. Do not treat yourself. This drug decreases your body's ability to fight infections. Try to avoid being around people who are sick. This medicine may increase your risk to bruise or bleed. Call your doctor or health care professional if you notice any unusual bleeding. Be careful brushing and flossing your teeth or using a toothpick because you may get an infection or bleed more easily. If you have any dental work done, tell your dentist you are receiving this medicine. Avoid taking products that contain aspirin, acetaminophen, ibuprofen, naproxen, or ketoprofen unless instructed by your doctor. These medicines may hide a fever. Do not become pregnant while taking this medicine or for 6 months after stopping it. Women should inform their doctor if they wish to become pregnant or think they might be pregnant. Men should not father a child while taking this medicine and for 3 months after stopping it. There is a potential for serious side effects to an unborn child. Talk to your health care professional or pharmacist for more information. Do not breast-feed an infant while taking this medicine or for at least 1 week after stopping   inform their doctors if they wish to father a child. This medicine may lower sperm counts. Talk with your doctor or health care professional if you are concerned about your fertility. What side effects may I notice from receiving this medicine? Side effects that you should report to your doctor or health care professional as soon as possible:  allergic reactions like skin rash, itching or hives, swelling of the face, lips, or tongue  breathing problems  pain, redness, or irritation at site where injected  signs and symptoms of a dangerous change in heartbeat or heart rhythm like chest pain; dizziness; fast or irregular  heartbeat; palpitations; feeling faint or lightheaded, falls; breathing problems  signs of decreased platelets or bleeding - bruising, pinpoint red spots on the skin, black, tarry stools, blood in the urine  signs of decreased red blood cells - unusually weak or tired, feeling faint or lightheaded, falls  signs of infection - fever or chills, cough, sore throat, pain or difficulty passing urine  signs and symptoms of kidney injury like trouble passing urine or change in the amount of urine  signs and symptoms of liver injury like dark yellow or brown urine; general ill feeling or flu-like symptoms; light-colored stools; loss of appetite; nausea; right upper belly pain; unusually weak or tired; yellowing of the eyes or skin  swelling of ankles, feet, hands Side effects that usually do not require medical attention (report to your doctor or health care professional if they continue or are bothersome):  constipation  diarrhea  hair loss  loss of appetite  nausea  rash  vomiting This list may not describe all possible side effects. Call your doctor for medical advice about side effects. You may report side effects to FDA at 1-800-FDA-1088. Where should I keep my medicine? This drug is given in a hospital or clinic and will not be stored at home. NOTE: This sheet is a summary. It may not cover all possible information. If you have questions about this medicine, talk to your doctor, pharmacist, or health care provider.  2021 Elsevier/Gold Standard (2017-08-31 18:06:11)

## 2020-11-03 NOTE — Progress Notes (Signed)
Pt d/c stable at 1510 on 11/01/19

## 2020-11-03 NOTE — Patient Instructions (Signed)
Pegfilgrastim injection What is this medicine? PEGFILGRASTIM (PEG fil gra stim) is a long-acting granulocyte colony-stimulating factor that stimulates the growth of neutrophils, a type of white blood cell important in the body's fight against infection. It is used to reduce the incidence of fever and infection in patients with certain types of cancer who are receiving chemotherapy that affects the bone marrow, and to increase survival after being exposed to high doses of radiation. This medicine may be used for other purposes; ask your health care provider or pharmacist if you have questions. COMMON BRAND NAME(S): Fulphila, Neulasta, Nyvepria, UDENYCA, Ziextenzo What should I tell my health care provider before I take this medicine? They need to know if you have any of these conditions:  kidney disease  latex allergy  ongoing radiation therapy  sickle cell disease  skin reactions to acrylic adhesives (On-Body Injector only)  an unusual or allergic reaction to pegfilgrastim, filgrastim, other medicines, foods, dyes, or preservatives  pregnant or trying to get pregnant  breast-feeding How should I use this medicine? This medicine is for injection under the skin. If you get this medicine at home, you will be taught how to prepare and give the pre-filled syringe or how to use the On-body Injector. Refer to the patient Instructions for Use for detailed instructions. Use exactly as directed. Tell your healthcare provider immediately if you suspect that the On-body Injector may not have performed as intended or if you suspect the use of the On-body Injector resulted in a missed or partial dose. It is important that you put your used needles and syringes in a special sharps container. Do not put them in a trash can. If you do not have a sharps container, call your pharmacist or healthcare provider to get one. Talk to your pediatrician regarding the use of this medicine in children. While this drug  may be prescribed for selected conditions, precautions do apply. Overdosage: If you think you have taken too much of this medicine contact a poison control center or emergency room at once. NOTE: This medicine is only for you. Do not share this medicine with others. What if I miss a dose? It is important not to miss your dose. Call your doctor or health care professional if you miss your dose. If you miss a dose due to an On-body Injector failure or leakage, a new dose should be administered as soon as possible using a single prefilled syringe for manual use. What may interact with this medicine? Interactions have not been studied. This list may not describe all possible interactions. Give your health care provider a list of all the medicines, herbs, non-prescription drugs, or dietary supplements you use. Also tell them if you smoke, drink alcohol, or use illegal drugs. Some items may interact with your medicine. What should I watch for while using this medicine? Your condition will be monitored carefully while you are receiving this medicine. You may need blood work done while you are taking this medicine. Talk to your health care provider about your risk of cancer. You may be more at risk for certain types of cancer if you take this medicine. If you are going to need a MRI, CT scan, or other procedure, tell your doctor that you are using this medicine (On-Body Injector only). What side effects may I notice from receiving this medicine? Side effects that you should report to your doctor or health care professional as soon as possible:  allergic reactions (skin rash, itching or hives, swelling of   the face, lips, or tongue)  back pain  dizziness  fever  pain, redness, or irritation at site where injected  pinpoint red spots on the skin  red or dark-brown urine  shortness of breath or breathing problems  stomach or side pain, or pain at the shoulder  swelling  tiredness  trouble  passing urine or change in the amount of urine  unusual bruising or bleeding Side effects that usually do not require medical attention (report to your doctor or health care professional if they continue or are bothersome):  bone pain  muscle pain This list may not describe all possible side effects. Call your doctor for medical advice about side effects. You may report side effects to FDA at 1-800-FDA-1088. Where should I keep my medicine? Keep out of the reach of children. If you are using this medicine at home, you will be instructed on how to store it. Throw away any unused medicine after the expiration date on the label. NOTE: This sheet is a summary. It may not cover all possible information. If you have questions about this medicine, talk to your doctor, pharmacist, or health care provider.  2021 Elsevier/Gold Standard (2019-06-29 13:20:51)  

## 2020-11-03 NOTE — Progress Notes (Signed)
Documented on late entry,

## 2020-11-07 ENCOUNTER — Other Ambulatory Visit: Payer: Self-pay | Admitting: Hematology and Oncology

## 2020-11-07 ENCOUNTER — Other Ambulatory Visit: Payer: Self-pay

## 2020-11-07 DIAGNOSIS — R109 Unspecified abdominal pain: Secondary | ICD-10-CM

## 2020-11-07 MED ORDER — DIPHENOXYLATE-ATROPINE 2.5-0.025 MG PO TABS: 1.0000 | ORAL_TABLET | Freq: Four times a day (QID) | ORAL | 0 refills | Status: DC | PRN

## 2020-11-07 MED ORDER — OXYCODONE HCL 10 MG PO TABS: 10.0000 mg | ORAL_TABLET | ORAL | 0 refills | Status: DC | PRN

## 2020-11-10 NOTE — Progress Notes (Signed)
[facility]  [address] [place],  [place]  [address] [phone]  Clinic Day:  11/13/2020  Referring physician: Greig Right, MD  This document serves as a record of services personally performed by Dequincy Macarthur Critchley, MD. It was created on their behalf by [facility] E, a trained medical scribe. The creation of this record is based on the scribe's personal observations and the provider's statements to them.  HISTORY OF PRESENT ILLNESS:  The patient is a 60 y.o. female with metastatic hormone positive breast cancer, which includes spread of disease to her stomach.  Recent scans also showed bilateral pleural effusions, pericardial effusion and evidence of a mechanical partial small bowel obstruction.  She comes in today to be evaluated before heading into her 2nd cycle of carboplatin/gemcitabine.  The patient claims to have tolerated her 1st cycle of chemotherapy fairly well.  She did have intermittent nausea for which she takes sublingual Zofran.  Otherwise, she has done fairly well over the past few weeks.    PHYSICAL EXAM:  Blood pressure 111/69, pulse 90, temperature 98 F (36.7 C), resp. rate 16, height 5' 5.5" (1.664 m), weight 120 lb (54.4 kg), SpO2 98 %. Wt Readings from Last 3 Encounters:  11/13/20 120 lb (54.4 kg)  11/03/20 124 lb (56.2 kg)  10/31/20 125 lb 1.3 oz (56.7 kg)   Body mass index is 19.67 kg/m. Performance status (ECOG): 1 Physical Exam Constitutional:      Appearance: Normal appearance. She is not ill-appearing.  HENT:     Mouth/Throat:     Mouth: Mucous membranes are moist.     Pharynx: Oropharynx is clear. No oropharyngeal exudate or posterior oropharyngeal erythema.  Cardiovascular:     Rate and Rhythm: Normal rate and regular rhythm.     Heart sounds: No murmur heard. No friction rub. No gallop.   Pulmonary:     Effort: Pulmonary effort is normal. No respiratory distress.     Breath sounds: Normal breath sounds. No  wheezing, rhonchi or rales.  Chest:  Breasts:     Right: No axillary adenopathy or supraclavicular adenopathy.     Left: No axillary adenopathy or supraclavicular adenopathy.    Abdominal:     General: Bowel sounds are normal. There is no distension.     Palpations: Abdomen is soft. There is no mass.     Tenderness: There is no abdominal tenderness.  Musculoskeletal:        General: No swelling.     Right lower leg: No edema.     Left lower leg: No edema.  Lymphadenopathy:     Cervical: No cervical adenopathy.     Upper Body:     Right upper body: No supraclavicular or axillary adenopathy.     Left upper body: No supraclavicular or axillary adenopathy.     Lower Body: No right inguinal adenopathy. No left inguinal adenopathy.  Skin:    General: Skin is warm.     Coloration: Skin is not jaundiced.     Findings: No lesion or rash.  Neurological:     General: No focal deficit present.     Mental Status: She is alert and oriented to person, place, and time. Mental status is at baseline.     Cranial Nerves: Cranial nerves are intact.  Psychiatric:        Mood and Affect: Mood normal.        Behavior: Behavior normal.        Thought Content:  Thought content normal.    LABS:   ASSESSMENT & PLAN:  Assessment/Plan:  A 60 y.o. female with metastatic hormone positive breast cancer. She will proceed with her 2nd cycle of carboplatin/gemcitabine tomorrow.  Carboplatin is being given on day 1; gemcitabine is being given on days 1 and 8.  Clinically, she appears to be holding her own.  I will see her back in 3 weeks before she heads into her 3rd cycle of carboplatin/gemcitabine.  The patient understands all the plans discussed today and is in agreement with them.   I, Rita Ohara, am acting as scribe for Marice Potter, MD    I have reviewed this report as typed by the medical scribe, and it is complete and accurate.  Dequincy Macarthur Critchley, MD

## 2020-11-13 ENCOUNTER — Other Ambulatory Visit: Payer: Self-pay | Admitting: Pharmacist

## 2020-11-13 ENCOUNTER — Encounter: Payer: Self-pay | Admitting: Oncology

## 2020-11-13 ENCOUNTER — Inpatient Hospital Stay: Payer: PPO

## 2020-11-13 ENCOUNTER — Other Ambulatory Visit: Payer: Self-pay | Admitting: Oncology

## 2020-11-13 ENCOUNTER — Inpatient Hospital Stay: Payer: PPO | Admitting: Oncology

## 2020-11-13 ENCOUNTER — Other Ambulatory Visit: Payer: Self-pay

## 2020-11-13 ENCOUNTER — Telehealth: Payer: Self-pay | Admitting: Oncology

## 2020-11-13 VITALS — BP 111/69 | HR 90 | Temp 98.0°F | Resp 16 | Ht 65.5 in | Wt 120.0 lb

## 2020-11-13 DIAGNOSIS — C785 Secondary malignant neoplasm of large intestine and rectum: Secondary | ICD-10-CM

## 2020-11-13 DIAGNOSIS — C50111 Malignant neoplasm of central portion of right female breast: Secondary | ICD-10-CM | POA: Diagnosis not present

## 2020-11-13 LAB — BASIC METABOLIC PANEL
BUN: 24 — AB (ref 4–21)
CO2: 24 — AB (ref 13–22)
Chloride: 104 (ref 99–108)
Creatinine: 0.9 (ref 0.5–1.1)
Glucose: 141
Potassium: 3.9 (ref 3.4–5.3)
Sodium: 135 — AB (ref 137–147)

## 2020-11-13 LAB — HEPATIC FUNCTION PANEL
ALT: 25 (ref 7–35)
AST: 37 — AB (ref 13–35)
Alkaline Phosphatase: 171 — AB (ref 25–125)
Bilirubin, Total: 0.4

## 2020-11-13 LAB — COMPREHENSIVE METABOLIC PANEL WITH GFR
Albumin: 3.2 — AB (ref 3.5–5.0)
Calcium: 8 — AB (ref 8.7–10.7)

## 2020-11-13 LAB — CBC AND DIFFERENTIAL
HCT: 32 — AB (ref 36–46)
Hemoglobin: 10.5 — AB (ref 12.0–16.0)
Neutrophils Absolute: 15.6
Platelets: 318 (ref 150–399)
WBC: 19.5

## 2020-11-13 LAB — CBC: RBC: 3.24 — AB (ref 3.87–5.11)

## 2020-11-13 NOTE — Telephone Encounter (Signed)
Per 5/26 los next appt scheduled and given to patient 

## 2020-11-14 ENCOUNTER — Inpatient Hospital Stay: Payer: PPO

## 2020-11-14 ENCOUNTER — Other Ambulatory Visit: Payer: Self-pay | Admitting: Hematology and Oncology

## 2020-11-14 VITALS — BP 104/69 | HR 84 | Temp 98.3°F | Resp 18 | Ht 65.5 in | Wt 115.0 lb

## 2020-11-14 DIAGNOSIS — C50111 Malignant neoplasm of central portion of right female breast: Secondary | ICD-10-CM

## 2020-11-14 DIAGNOSIS — Z5111 Encounter for antineoplastic chemotherapy: Secondary | ICD-10-CM | POA: Diagnosis not present

## 2020-11-14 DIAGNOSIS — Z170 Estrogen receptor positive status [ER+]: Secondary | ICD-10-CM

## 2020-11-14 MED ORDER — SODIUM CHLORIDE 0.9 % IV SOLN
150.0000 mg | Freq: Once | INTRAVENOUS | Status: AC
  Administered 2020-11-14: 150 mg via INTRAVENOUS
  Filled 2020-11-14: qty 150

## 2020-11-14 MED ORDER — SODIUM CHLORIDE 0.9 % IV SOLN
10.0000 mg | Freq: Once | INTRAVENOUS | Status: AC
  Administered 2020-11-14: 10 mg via INTRAVENOUS
  Filled 2020-11-14: qty 10

## 2020-11-14 MED ORDER — SODIUM CHLORIDE 0.9 % IV SOLN
Freq: Once | INTRAVENOUS | Status: AC
  Filled 2020-11-14: qty 250

## 2020-11-14 MED ORDER — SODIUM CHLORIDE 0.9 % IV SOLN
800.0000 mg/m2 | Freq: Once | INTRAVENOUS | Status: AC
  Administered 2020-11-14: 1330 mg via INTRAVENOUS
  Filled 2020-11-14: qty 26.3

## 2020-11-14 MED ORDER — OLANZAPINE 10 MG PO TABS
10.0000 mg | ORAL_TABLET | Freq: Every day | ORAL | 0 refills | Status: DC
Start: 2020-11-14 — End: 2020-11-28

## 2020-11-14 MED ORDER — SODIUM CHLORIDE 0.9 % IV SOLN
426.0000 mg | Freq: Once | INTRAVENOUS | Status: AC
  Administered 2020-11-14: 430 mg via INTRAVENOUS
  Filled 2020-11-14: qty 43

## 2020-11-14 MED ORDER — PALONOSETRON HCL INJECTION 0.25 MG/5ML
INTRAVENOUS | Status: AC
  Filled 2020-11-14: qty 5

## 2020-11-14 MED ORDER — HEPARIN SOD (PORK) LOCK FLUSH 100 UNIT/ML IV SOLN
500.0000 [IU] | Freq: Once | INTRAVENOUS | Status: AC | PRN
  Administered 2020-11-14: 500 [IU]
  Filled 2020-11-14: qty 5

## 2020-11-14 MED ORDER — PALONOSETRON HCL INJECTION 0.25 MG/5ML
0.2500 mg | Freq: Once | INTRAVENOUS | Status: AC
  Administered 2020-11-14: 0.25 mg via INTRAVENOUS

## 2020-11-14 NOTE — Patient Instructions (Signed)
[facility]  Discharge Instructions: °Thank you for choosing [facility] to provide your oncology and hematology care.  °If you have a lab appointment with the Cancer Center, please go directly to the Cancer Center and check in at the registration area. °  °Wear comfortable clothing and clothing appropriate for easy access to any Portacath or PICC line.  ° °We strive to give you quality time with your provider. You may need to reschedule your appointment if you arrive late (15 or more minutes).  Arriving late affects you and other patients whose appointments are after yours.  Also, if you miss three or more appointments without notifying the office, you may be dismissed from the clinic at the provider’s discretion.    °  °For prescription refill requests, have your pharmacy contact our office and allow 72 hours for refills to be completed.   ° °Today you received the following chemotherapy and/or immunotherapy agents Gemcitabine,Carboplatin  °  °To help prevent nausea and vomiting after your treatment, we encourage you to take your nausea medication as directed. ° °BELOW ARE SYMPTOMS THAT SHOULD BE REPORTED IMMEDIATELY: °*FEVER GREATER THAN 100.4 F (38 °C) OR HIGHER °*CHILLS OR SWEATING °*NAUSEA AND VOMITING THAT IS NOT CONTROLLED WITH YOUR NAUSEA MEDICATION °*UNUSUAL SHORTNESS OF BREATH °*UNUSUAL BRUISING OR BLEEDING °*URINARY PROBLEMS (pain or burning when urinating, or frequent urination) °*BOWEL PROBLEMS (unusual diarrhea, constipation, pain near the anus) °TENDERNESS IN MOUTH AND THROAT WITH OR WITHOUT PRESENCE OF ULCERS (sore throat, sores in mouth, or a toothache) °UNUSUAL RASH, SWELLING OR PAIN  °UNUSUAL VAGINAL DISCHARGE OR ITCHING  ° °Items with * indicate a potential emergency and should be followed up as soon as possible or go to the Emergency Department if any problems should occur. ° °Please show the CHEMOTHERAPY ALERT CARD or IMMUNOTHERAPY ALERT CARD at check-in  to the Emergency Department and triage nurse. ° °Should you have questions after your visit or need to cancel or reschedule your appointment, please contact [facility]  Dept: [phone]  and follow the prompts.  Office hours are 8:00 a.m. to 4:30 p.m. Monday - Friday. Please note that voicemails left after 4:00 p.m. may not be returned until the following business day.  We are closed weekends and major holidays. You have access to a nurse at all times for urgent questions. Please call the main number to the clinic Dept: [phone] and follow the prompts. ° °For any non-urgent questions, you may also contact your provider using MyChart. We now offer e-Visits for anyone 18 and older to request care online for non-urgent symptoms. For details visit [email or web address]. °  °Also download the MyChart app! Go to the app store, search "MyChart", open the app, select [facility], and log in with your MyChart username and password. ° °Due to Covid, a mask is required upon entering the hospital/clinic. If you do not have a mask, one will be given to you upon arrival. For doctor visits, patients may have 1 support person aged 18 or older with them. For treatment visits, patients cannot have anyone with them due to current Covid guidelines and our immunocompromised population.  ° ° °

## 2020-11-14 NOTE — Progress Notes (Signed)
1320: PT STABLE AT TIME OF DISCHARGE

## 2020-11-20 ENCOUNTER — Inpatient Hospital Stay: Payer: PPO | Attending: Oncology

## 2020-11-20 ENCOUNTER — Other Ambulatory Visit: Payer: Self-pay

## 2020-11-20 DIAGNOSIS — C50919 Malignant neoplasm of unspecified site of unspecified female breast: Secondary | ICD-10-CM | POA: Insufficient documentation

## 2020-11-20 DIAGNOSIS — Z170 Estrogen receptor positive status [ER+]: Secondary | ICD-10-CM | POA: Insufficient documentation

## 2020-11-20 DIAGNOSIS — C7889 Secondary malignant neoplasm of other digestive organs: Secondary | ICD-10-CM | POA: Insufficient documentation

## 2020-11-20 DIAGNOSIS — T451X5A Adverse effect of antineoplastic and immunosuppressive drugs, initial encounter: Secondary | ICD-10-CM | POA: Insufficient documentation

## 2020-11-20 DIAGNOSIS — C785 Secondary malignant neoplasm of large intestine and rectum: Secondary | ICD-10-CM

## 2020-11-20 DIAGNOSIS — Z5111 Encounter for antineoplastic chemotherapy: Secondary | ICD-10-CM | POA: Insufficient documentation

## 2020-11-20 DIAGNOSIS — Z5189 Encounter for other specified aftercare: Secondary | ICD-10-CM | POA: Insufficient documentation

## 2020-11-20 DIAGNOSIS — D649 Anemia, unspecified: Secondary | ICD-10-CM | POA: Diagnosis not present

## 2020-11-20 DIAGNOSIS — D6481 Anemia due to antineoplastic chemotherapy: Secondary | ICD-10-CM | POA: Insufficient documentation

## 2020-11-21 ENCOUNTER — Inpatient Hospital Stay: Payer: PPO

## 2020-11-21 ENCOUNTER — Ambulatory Visit: Payer: PPO

## 2020-11-21 VITALS — BP 151/68 | HR 89 | Temp 98.4°F | Resp 18 | Ht 65.5 in | Wt 113.8 lb

## 2020-11-21 DIAGNOSIS — Z170 Estrogen receptor positive status [ER+]: Secondary | ICD-10-CM | POA: Diagnosis not present

## 2020-11-21 DIAGNOSIS — D6481 Anemia due to antineoplastic chemotherapy: Secondary | ICD-10-CM | POA: Diagnosis not present

## 2020-11-21 DIAGNOSIS — C50919 Malignant neoplasm of unspecified site of unspecified female breast: Secondary | ICD-10-CM | POA: Diagnosis not present

## 2020-11-21 DIAGNOSIS — C50111 Malignant neoplasm of central portion of right female breast: Secondary | ICD-10-CM

## 2020-11-21 DIAGNOSIS — Z5111 Encounter for antineoplastic chemotherapy: Secondary | ICD-10-CM | POA: Diagnosis not present

## 2020-11-21 DIAGNOSIS — R197 Diarrhea, unspecified: Secondary | ICD-10-CM

## 2020-11-21 DIAGNOSIS — C7889 Secondary malignant neoplasm of other digestive organs: Secondary | ICD-10-CM | POA: Diagnosis not present

## 2020-11-21 DIAGNOSIS — T451X5A Adverse effect of antineoplastic and immunosuppressive drugs, initial encounter: Secondary | ICD-10-CM | POA: Diagnosis not present

## 2020-11-21 DIAGNOSIS — Z5189 Encounter for other specified aftercare: Secondary | ICD-10-CM | POA: Diagnosis not present

## 2020-11-21 MED ORDER — ONDANSETRON HCL 4 MG/2ML IJ SOLN
8.0000 mg | Freq: Once | INTRAMUSCULAR | Status: AC
  Administered 2020-11-21: 8 mg via INTRAVENOUS

## 2020-11-21 MED ORDER — ONDANSETRON HCL 4 MG/2ML IJ SOLN
INTRAMUSCULAR | Status: AC
  Filled 2020-11-21: qty 4

## 2020-11-21 MED ORDER — OCTREOTIDE ACETATE 30 MG IM KIT
PACK | INTRAMUSCULAR | Status: AC
  Filled 2020-11-21: qty 1

## 2020-11-21 MED ORDER — SODIUM CHLORIDE 0.9 % IV SOLN
Freq: Once | INTRAVENOUS | Status: AC
  Filled 2020-11-21: qty 250

## 2020-11-21 MED ORDER — HEPARIN SOD (PORK) LOCK FLUSH 100 UNIT/ML IV SOLN
500.0000 [IU] | Freq: Once | INTRAVENOUS | Status: AC | PRN
  Administered 2020-11-21: 500 [IU]
  Filled 2020-11-21: qty 5

## 2020-11-21 MED ORDER — OCTREOTIDE ACETATE 30 MG IM KIT
30.0000 mg | PACK | Freq: Once | INTRAMUSCULAR | Status: AC
Start: 2020-11-21 — End: 2020-11-21
  Administered 2020-11-21: 30 mg via INTRAMUSCULAR

## 2020-11-21 MED ORDER — SODIUM CHLORIDE 0.9 % IV SOLN
800.0000 mg/m2 | Freq: Once | INTRAVENOUS | Status: AC
  Administered 2020-11-21: 1330 mg via INTRAVENOUS
  Filled 2020-11-21: qty 26.3

## 2020-11-21 NOTE — Patient Instructions (Signed)
[facility] [place]  Discharge Instructions: ?Thank you for choosing [facility] to provide your oncology and hematology care.  ?If you have a lab appointment with the Cancer Center, please go directly to the Cancer Center and check in at the registration area. ?  ?Wear comfortable clothing and clothing appropriate for easy access to any Portacath or PICC line.  ? ?We strive to give you quality time with your provider. You may need to reschedule your appointment if you arrive late (15 or more minutes).  Arriving late affects you and other patients whose appointments are after yours.  Also, if you miss three or more appointments without notifying the office, you may be dismissed from the clinic at the provider?s discretion.    ?  ?For prescription refill requests, have your pharmacy contact our office and allow 72 hours for refills to be completed.   ? ?Today you received the following chemotherapy and/or immunotherapy agents Gemcitabine    ?  ?To help prevent nausea and vomiting after your treatment, we encourage you to take your nausea medication as directed. ? ?BELOW ARE SYMPTOMS THAT SHOULD BE REPORTED IMMEDIATELY: ?*FEVER GREATER THAN 100.4 F (38 ?C) OR HIGHER ?*CHILLS OR SWEATING ?*NAUSEA AND VOMITING THAT IS NOT CONTROLLED WITH YOUR NAUSEA MEDICATION ?*UNUSUAL SHORTNESS OF BREATH ?*UNUSUAL BRUISING OR BLEEDING ?*URINARY PROBLEMS (pain or burning when urinating, or frequent urination) ?*BOWEL PROBLEMS (unusual diarrhea, constipation, pain near the anus) ?TENDERNESS IN MOUTH AND THROAT WITH OR WITHOUT PRESENCE OF ULCERS (sore throat, sores in mouth, or a toothache) ?UNUSUAL RASH, SWELLING OR PAIN  ?UNUSUAL VAGINAL DISCHARGE OR ITCHING  ? ?Items with * indicate a potential emergency and should be followed up as soon as possible or go to the Emergency Department if any problems should occur. ? ?Please show the CHEMOTHERAPY ALERT CARD or IMMUNOTHERAPY ALERT CARD at check-in to the  Emergency Department and triage nurse. ? ?Should you have questions after your visit or need to cancel or reschedule your appointment, please contact [facility] AT   Dept: [phone]  and follow the prompts.  Office hours are 8:00 a.m. to 4:30 p.m. Monday - Friday. Please note that voicemails left after 4:00 p.m. may not be returned until the following business day.  We are closed weekends and major holidays. You have access to a nurse at all times for urgent questions. Please call the main number to the clinic Dept: [phone] and follow the prompts. ? ?For any non-urgent questions, you may also contact your provider using MyChart. We now offer e-Visits for anyone 18 and older to request care online for non-urgent symptoms. For details visit [email or web address]. ?  ?Also download the MyChart app! Go to the app store, search "MyChart", open the app, select [facility], and log in with your MyChart username and password. ? ?Due to Covid, a mask is required upon entering the hospital/clinic. If you do not have a mask, one will be given to you upon arrival. For doctor visits, patients may have 1 support person aged 18 or older with them. For treatment visits, patients cannot have anyone with them due to current Covid guidelines and our immunocompromised population.  ? ? ?

## 2020-11-21 NOTE — Progress Notes (Signed)
1153: PT STABLE AT TIME OF DISCHARGE

## 2020-11-24 ENCOUNTER — Encounter: Payer: Self-pay | Admitting: Oncology

## 2020-11-24 ENCOUNTER — Other Ambulatory Visit: Payer: Self-pay

## 2020-11-24 ENCOUNTER — Inpatient Hospital Stay: Payer: PPO

## 2020-11-24 VITALS — BP 104/64 | HR 94 | Temp 99.0°F | Resp 18 | Ht 65.5 in | Wt 119.0 lb

## 2020-11-24 DIAGNOSIS — Z5111 Encounter for antineoplastic chemotherapy: Secondary | ICD-10-CM | POA: Diagnosis not present

## 2020-11-24 DIAGNOSIS — C50111 Malignant neoplasm of central portion of right female breast: Secondary | ICD-10-CM

## 2020-11-24 DIAGNOSIS — Z170 Estrogen receptor positive status [ER+]: Secondary | ICD-10-CM

## 2020-11-24 MED ORDER — PEGFILGRASTIM-JMDB 6 MG/0.6ML ~~LOC~~ SOSY
PREFILLED_SYRINGE | SUBCUTANEOUS | Status: AC
  Filled 2020-11-24: qty 0.6

## 2020-11-24 MED ORDER — PEGFILGRASTIM-JMDB 6 MG/0.6ML ~~LOC~~ SOSY
6.0000 mg | PREFILLED_SYRINGE | Freq: Once | SUBCUTANEOUS | Status: AC
Start: 2020-11-24 — End: 2020-11-24
  Administered 2020-11-24: 6 mg via SUBCUTANEOUS

## 2020-11-24 NOTE — Patient Instructions (Signed)
Pegfilgrastim injection What is this medicine? PEGFILGRASTIM (PEG fil gra stim) is a long-acting granulocyte colony-stimulating factor that stimulates the growth of neutrophils, a type of white blood cell important in the body's fight against infection. It is used to reduce the incidence of fever and infection in patients with certain types of cancer who are receiving chemotherapy that affects the bone marrow, and to increase survival after being exposed to high doses of radiation. This medicine may be used for other purposes; ask your health care provider or pharmacist if you have questions. COMMON BRAND NAME(S): Fulphila, Neulasta, Nyvepria, UDENYCA, Ziextenzo What should I tell my health care provider before I take this medicine? They need to know if you have any of these conditions:  kidney disease  latex allergy  ongoing radiation therapy  sickle cell disease  skin reactions to acrylic adhesives (On-Body Injector only)  an unusual or allergic reaction to pegfilgrastim, filgrastim, other medicines, foods, dyes, or preservatives  pregnant or trying to get pregnant  breast-feeding How should I use this medicine? This medicine is for injection under the skin. If you get this medicine at home, you will be taught how to prepare and give the pre-filled syringe or how to use the On-body Injector. Refer to the patient Instructions for Use for detailed instructions. Use exactly as directed. Tell your healthcare provider immediately if you suspect that the On-body Injector may not have performed as intended or if you suspect the use of the On-body Injector resulted in a missed or partial dose. It is important that you put your used needles and syringes in a special sharps container. Do not put them in a trash can. If you do not have a sharps container, call your pharmacist or healthcare provider to get one. Talk to your pediatrician regarding the use of this medicine in children. While this drug  may be prescribed for selected conditions, precautions do apply. Overdosage: If you think you have taken too much of this medicine contact a poison control center or emergency room at once. NOTE: This medicine is only for you. Do not share this medicine with others. What if I miss a dose? It is important not to miss your dose. Call your doctor or health care professional if you miss your dose. If you miss a dose due to an On-body Injector failure or leakage, a new dose should be administered as soon as possible using a single prefilled syringe for manual use. What may interact with this medicine? Interactions have not been studied. This list may not describe all possible interactions. Give your health care provider a list of all the medicines, herbs, non-prescription drugs, or dietary supplements you use. Also tell them if you smoke, drink alcohol, or use illegal drugs. Some items may interact with your medicine. What should I watch for while using this medicine? Your condition will be monitored carefully while you are receiving this medicine. You may need blood work done while you are taking this medicine. Talk to your health care provider about your risk of cancer. You may be more at risk for certain types of cancer if you take this medicine. If you are going to need a MRI, CT scan, or other procedure, tell your doctor that you are using this medicine (On-Body Injector only). What side effects may I notice from receiving this medicine? Side effects that you should report to your doctor or health care professional as soon as possible:  allergic reactions (skin rash, itching or hives, swelling of   the face, lips, or tongue)  back pain  dizziness  fever  pain, redness, or irritation at site where injected  pinpoint red spots on the skin  red or dark-brown urine  shortness of breath or breathing problems  stomach or side pain, or pain at the shoulder  swelling  tiredness  trouble  passing urine or change in the amount of urine  unusual bruising or bleeding Side effects that usually do not require medical attention (report to your doctor or health care professional if they continue or are bothersome):  bone pain  muscle pain This list may not describe all possible side effects. Call your doctor for medical advice about side effects. You may report side effects to FDA at 1-800-FDA-1088. Where should I keep my medicine? Keep out of the reach of children. If you are using this medicine at home, you will be instructed on how to store it. Throw away any unused medicine after the expiration date on the label. NOTE: This sheet is a summary. It may not cover all possible information. If you have questions about this medicine, talk to your doctor, pharmacist, or health care provider.  2021 Elsevier/Gold Standard (2019-06-29 13:20:51)  

## 2020-11-27 ENCOUNTER — Encounter: Payer: Self-pay | Admitting: Oncology

## 2020-11-28 ENCOUNTER — Other Ambulatory Visit: Payer: Self-pay | Admitting: Hematology and Oncology

## 2020-11-28 DIAGNOSIS — T451X5A Adverse effect of antineoplastic and immunosuppressive drugs, initial encounter: Secondary | ICD-10-CM

## 2020-11-28 DIAGNOSIS — R112 Nausea with vomiting, unspecified: Secondary | ICD-10-CM

## 2020-11-29 ENCOUNTER — Other Ambulatory Visit: Payer: Self-pay | Admitting: Hematology and Oncology

## 2020-12-01 ENCOUNTER — Other Ambulatory Visit: Payer: Self-pay | Admitting: Hematology and Oncology

## 2020-12-01 DIAGNOSIS — E876 Hypokalemia: Secondary | ICD-10-CM

## 2020-12-01 NOTE — Progress Notes (Signed)
[facility]  [address] [place],  [place]  [address] [phone]  Clinic Day:  12/04/2020  Referring physician: Greig Right, MD  This document serves as a record of services personally performed by Dequincy Macarthur Critchley, MD. It was created on their behalf by [facility] E, a trained medical scribe. The creation of this record is based on the scribe's personal observations and the provider's statements to them.  HISTORY OF PRESENT ILLNESS:  The patient is a 60 y.o. female with metastatic hormone positive breast cancer, which includes spread of disease to her stomach.  Recent scans also showed bilateral pleural effusions, pericardial effusion and evidence of a mechanical partial small bowel obstruction.  She comes in today to be evaluated before heading into her 3rd cycle of carboplatin/gemcitabine.  The patient claims to have tolerated her 2nd cycle of chemotherapy fairly well.  She still has intermittent diarrhea and abdominal pain, which has been manageable to date.   She denies having any new symptoms or findings which concern her for disease progression.  PHYSICAL EXAM:  Blood pressure (!) 108/58, pulse 86, temperature 98.1 F (36.7 C), resp. rate 14, height 5' 5.5" (1.664 m), weight 126 lb (57.2 kg), SpO2 95 %. Wt Readings from Last 3 Encounters:  12/04/20 126 lb (57.2 kg)  11/24/20 119 lb (54 kg)  11/21/20 113 lb 12 oz (51.6 kg)   Body mass index is 20.65 kg/m. Performance status (ECOG): 1 Physical Exam Constitutional:      Appearance: Normal appearance. She is not ill-appearing.  HENT:     Mouth/Throat:     Mouth: Mucous membranes are moist.     Pharynx: Oropharynx is clear. No oropharyngeal exudate or posterior oropharyngeal erythema.  Cardiovascular:     Rate and Rhythm: Normal rate and regular rhythm.     Heart sounds: No murmur heard.   No friction rub. No gallop.  Pulmonary:     Effort: Pulmonary effort is normal. No respiratory  distress.     Breath sounds: Normal breath sounds. No wheezing, rhonchi or rales.  Chest:  Breasts:    Right: No axillary adenopathy or supraclavicular adenopathy.     Left: No axillary adenopathy or supraclavicular adenopathy.  Abdominal:     General: Bowel sounds are normal. There is no distension.     Palpations: Abdomen is soft. There is no mass.     Tenderness: There is no abdominal tenderness.  Musculoskeletal:        General: No swelling.     Right lower leg: No edema.     Left lower leg: No edema.  Lymphadenopathy:     Cervical: No cervical adenopathy.     Upper Body:     Right upper body: No supraclavicular or axillary adenopathy.     Left upper body: No supraclavicular or axillary adenopathy.     Lower Body: No right inguinal adenopathy. No left inguinal adenopathy.  Skin:    General: Skin is warm.     Coloration: Skin is not jaundiced.     Findings: No lesion or rash.  Neurological:     General: No focal deficit present.     Mental Status: She is alert and oriented to person, place, and time. Mental status is at baseline.     Cranial Nerves: Cranial nerves are intact.  Psychiatric:        Mood and Affect: Mood normal.        Behavior: Behavior normal.  Thought Content: Thought content normal.   LABS:  Ref. Range 12/04/2020 00:00  Sodium Latest Ref Range: 137 - 147  138  Potassium Latest Ref Range: 3.4 - 5.3  4.1  Chloride Latest Ref Range: 99 - 108  106  CO2 Latest Ref Range: 13 - 22  26 (A)  Glucose Unknown 103  BUN Latest Ref Range: 4 - 21  18  Creatinine Latest Ref Range: 0.5 - 1.1  1.0  Calcium Latest Ref Range: 8.7 - 10.7  7.8 (A)  Alkaline Phosphatase Latest Ref Range: 25 - 125  149 (A)  Albumin Latest Ref Range: 3.5 - 5.0  2.8 (A)  AST Latest Ref Range: 13 - 35  28  ALT Latest Ref Range: 7 - 35  19  Bilirubin, Total Unknown 0.3  WBC Unknown 22.7  RBC Latest Ref Range: 3.87 - 5.11  2.26 (A)  Hemoglobin Latest Ref Range: 12.0 - 16.0  7.6 (A)   HCT Latest Ref Range: 36 - 46  23 (A)  Platelets Latest Ref Range: 150 - 399  212   ASSESSMENT & PLAN:  Assessment/Plan:  A 60 y.o. female with metastatic hormone positive breast cancer. She will proceed with her 3rd cycle of carboplatin/gemcitabine tomorrow.  Carboplatin is being given on day 1; gemcitabine is being given on days 1 and 8.  As her hemoglobin is below 8, I will arrange for her to receive 1 unit of blood tomorrow.  Clinically, the patient appears to be doing well.  I will see her back in 3 weeks before she heads into her 4th cycle of carboplatin/gemcitabine.  The patient understands all the plans discussed today and is in agreement with them.   I, Rita Ohara, am acting as scribe for Marice Potter, MD    I have reviewed this report as typed by the medical scribe, and it is complete and accurate.  Dequincy Macarthur Critchley, MD

## 2020-12-04 ENCOUNTER — Inpatient Hospital Stay: Payer: PPO

## 2020-12-04 ENCOUNTER — Inpatient Hospital Stay: Payer: PPO | Admitting: Oncology

## 2020-12-04 ENCOUNTER — Encounter: Payer: Self-pay | Admitting: Oncology

## 2020-12-04 ENCOUNTER — Telehealth: Payer: Self-pay | Admitting: Oncology

## 2020-12-04 ENCOUNTER — Other Ambulatory Visit: Payer: Self-pay

## 2020-12-04 ENCOUNTER — Other Ambulatory Visit: Payer: Self-pay | Admitting: Oncology

## 2020-12-04 VITALS — BP 108/58 | HR 86 | Temp 98.1°F | Resp 14 | Ht 65.5 in | Wt 126.0 lb

## 2020-12-04 DIAGNOSIS — C785 Secondary malignant neoplasm of large intestine and rectum: Secondary | ICD-10-CM

## 2020-12-04 DIAGNOSIS — C50111 Malignant neoplasm of central portion of right female breast: Secondary | ICD-10-CM

## 2020-12-04 DIAGNOSIS — Z170 Estrogen receptor positive status [ER+]: Secondary | ICD-10-CM

## 2020-12-04 DIAGNOSIS — Z5111 Encounter for antineoplastic chemotherapy: Secondary | ICD-10-CM | POA: Diagnosis not present

## 2020-12-04 LAB — COMPREHENSIVE METABOLIC PANEL
Albumin: 2.8 — AB (ref 3.5–5.0)
Calcium: 7.8 — AB (ref 8.7–10.7)

## 2020-12-04 LAB — BASIC METABOLIC PANEL
BUN: 18 (ref 4–21)
CO2: 26 — AB (ref 13–22)
Chloride: 106 (ref 99–108)
Creatinine: 1 (ref 0.5–1.1)
Glucose: 103
Potassium: 4.1 (ref 3.4–5.3)
Sodium: 138 (ref 137–147)

## 2020-12-04 LAB — CBC AND DIFFERENTIAL
HCT: 23 — AB (ref 36–46)
Hemoglobin: 7.6 — AB (ref 12.0–16.0)
Neutrophils Absolute: 18.16
Platelets: 212 (ref 150–399)
WBC: 22.7

## 2020-12-04 LAB — ABO/RH: ABO/RH(D): A POS

## 2020-12-04 LAB — HEPATIC FUNCTION PANEL
ALT: 19 (ref 7–35)
AST: 28 (ref 13–35)
Alkaline Phosphatase: 149 — AB (ref 25–125)
Bilirubin, Total: 0.3

## 2020-12-04 LAB — CBC: RBC: 2.26 — AB (ref 3.87–5.11)

## 2020-12-04 LAB — PREPARE RBC (CROSSMATCH)

## 2020-12-04 NOTE — Telephone Encounter (Signed)
Per 6/16 los next appt scheduled and given to patient

## 2020-12-05 ENCOUNTER — Other Ambulatory Visit: Payer: Self-pay | Admitting: Pharmacist

## 2020-12-05 ENCOUNTER — Inpatient Hospital Stay: Payer: PPO

## 2020-12-05 VITALS — BP 106/59 | HR 76 | Temp 97.7°F | Resp 18 | Wt 125.1 lb

## 2020-12-05 DIAGNOSIS — Z170 Estrogen receptor positive status [ER+]: Secondary | ICD-10-CM

## 2020-12-05 DIAGNOSIS — Z5111 Encounter for antineoplastic chemotherapy: Secondary | ICD-10-CM | POA: Diagnosis not present

## 2020-12-05 DIAGNOSIS — C50111 Malignant neoplasm of central portion of right female breast: Secondary | ICD-10-CM

## 2020-12-05 MED ORDER — PALONOSETRON HCL INJECTION 0.25 MG/5ML
INTRAVENOUS | Status: AC
  Filled 2020-12-05: qty 5

## 2020-12-05 MED ORDER — ACETAMINOPHEN 325 MG PO TABS
650.0000 mg | ORAL_TABLET | Freq: Once | ORAL | Status: AC
  Administered 2020-12-05: 650 mg via ORAL

## 2020-12-05 MED ORDER — HEPARIN SOD (PORK) LOCK FLUSH 100 UNIT/ML IV SOLN
500.0000 [IU] | Freq: Once | INTRAVENOUS | Status: AC | PRN
  Administered 2020-12-05: 500 [IU]
  Filled 2020-12-05: qty 5

## 2020-12-05 MED ORDER — SODIUM CHLORIDE 0.9% IV SOLUTION
250.0000 mL | Freq: Once | INTRAVENOUS | Status: AC
Start: 2020-12-05 — End: 2020-12-05
  Administered 2020-12-05: 250 mL via INTRAVENOUS
  Filled 2020-12-05: qty 250

## 2020-12-05 MED ORDER — DIPHENHYDRAMINE HCL 25 MG PO CAPS
ORAL_CAPSULE | ORAL | Status: AC
  Filled 2020-12-05: qty 1

## 2020-12-05 MED ORDER — SODIUM CHLORIDE 0.9 % IV SOLN
Freq: Once | INTRAVENOUS | Status: DC
  Filled 2020-12-05: qty 250

## 2020-12-05 MED ORDER — SODIUM CHLORIDE 0.9 % IV SOLN
396.0000 mg | Freq: Once | INTRAVENOUS | Status: AC
  Administered 2020-12-05: 400 mg via INTRAVENOUS
  Filled 2020-12-05: qty 40

## 2020-12-05 MED ORDER — DIPHENHYDRAMINE HCL 25 MG PO CAPS
25.0000 mg | ORAL_CAPSULE | Freq: Once | ORAL | Status: AC
  Administered 2020-12-05: 25 mg via ORAL

## 2020-12-05 MED ORDER — SODIUM CHLORIDE 0.9 % IV SOLN
150.0000 mg | Freq: Once | INTRAVENOUS | Status: AC
  Administered 2020-12-05: 150 mg via INTRAVENOUS
  Filled 2020-12-05: qty 150

## 2020-12-05 MED ORDER — SODIUM CHLORIDE 0.9 % IV SOLN
Freq: Once | INTRAVENOUS | Status: AC
Start: 2020-12-05 — End: 2020-12-05
  Filled 2020-12-05: qty 250

## 2020-12-05 MED ORDER — ACETAMINOPHEN 325 MG PO TABS
ORAL_TABLET | ORAL | Status: AC
  Filled 2020-12-05: qty 2

## 2020-12-05 MED ORDER — SODIUM CHLORIDE 0.9 % IV SOLN
800.0000 mg/m2 | Freq: Once | INTRAVENOUS | Status: AC
  Administered 2020-12-05: 1330 mg via INTRAVENOUS
  Filled 2020-12-05: qty 26.3

## 2020-12-05 MED ORDER — SODIUM CHLORIDE 0.9 % IV SOLN
10.0000 mg | Freq: Once | INTRAVENOUS | Status: AC
  Administered 2020-12-05: 10 mg via INTRAVENOUS
  Filled 2020-12-05: qty 10

## 2020-12-05 MED ORDER — PALONOSETRON HCL INJECTION 0.25 MG/5ML
0.2500 mg | Freq: Once | INTRAVENOUS | Status: AC
  Administered 2020-12-05: 0.25 mg via INTRAVENOUS

## 2020-12-05 NOTE — Patient Instructions (Signed)
[facility]  Discharge Instructions: Thank you for choosing [facility] to provide your oncology and hematology care.  If you have a lab appointment with the [facility], please go directly to the [facility] and check in at the registration area.   Wear comfortable clothing and clothing appropriate for easy access to any Portacath or PICC line.   We strive to give you quality time with your provider. You may need to reschedule your appointment if you arrive late (15 or more minutes).  Arriving late affects you and other patients whose appointments are after yours.  Also, if you miss three or more appointments without notifying the office, you may be dismissed from the clinic at the provider's discretion.      For prescription refill requests, have your pharmacy contact our office and allow 72 hours for refills to be completed.    Today you received the following chemotherapy and/or immunotherapy agents Gemcitabine, Carboplatin     To help prevent nausea and vomiting after your treatment, we encourage you to take your nausea medication as directed.  BELOW ARE SYMPTOMS THAT SHOULD BE REPORTED IMMEDIATELY: *FEVER GREATER THAN 100.4 F (38 C) OR HIGHER *CHILLS OR SWEATING *NAUSEA AND VOMITING THAT IS NOT CONTROLLED WITH YOUR NAUSEA MEDICATION *UNUSUAL SHORTNESS OF BREATH *UNUSUAL BRUISING OR BLEEDING *URINARY PROBLEMS (pain or burning when urinating, or frequent urination) *BOWEL PROBLEMS (unusual diarrhea, constipation, pain near the anus) TENDERNESS IN MOUTH AND THROAT WITH OR WITHOUT PRESENCE OF ULCERS (sore throat, sores in mouth, or a toothache) UNUSUAL RASH, SWELLING OR PAIN  UNUSUAL VAGINAL DISCHARGE OR ITCHING   Items with * indicate a potential emergency and should be followed up as soon as possible or go to the Emergency Department if any problems should occur.  Please show the CHEMOTHERAPY ALERT CARD or IMMUNOTHERAPY ALERT CARD at  check-in to the Emergency Department and triage nurse.  Should you have questions after your visit or need to cancel or reschedule your appointment, please contact [facility]  Dept: [phone]  and follow the prompts.  Office hours are 8:00 a.m. to 4:30 p.m. Monday - Friday. Please note that voicemails left after 4:00 p.m. may not be returned until the following business day.  We are closed weekends and major holidays. You have access to a nurse at all times for urgent questions. Please call the main number to the clinic Dept: [phone] and follow the prompts.  For any non-urgent questions, you may also contact your provider using MyChart. We now offer e-Visits for anyone 36 and older to request care online for non-urgent symptoms. For details visit [email or web address].   Also download the MyChart app! Go to the app store, search "MyChart", open the app, select [facility], and log in with your MyChart username and password.  Due to Covid, a mask is required upon entering the hospital/clinic. If you do not have a mask, one will be given to you upon arrival. For doctor visits, patients may have 1 support person aged 3 or older with them. For treatment visits, patients cannot have anyone with them due to current Covid guidelines and our immunocompromised population.   Blood Transfusion, Adult, Care After This sheet gives you information about how to care for yourself after your procedure. Your doctor may also give you more specific instructions. If youhave problems or questions, contact your doctor. What can I expect after the procedure? After the procedure, it is common to have:  Bruising and soreness at the IV site. A fever or chills on the day of the procedure. This may be your body's response to the new blood cells received. A headache. Follow these instructions at home: Insertion site care     Follow instructions from your doctor about how to take care of  your insertion site. This is where an IV tube was put into your vein. Make sure you: Wash your hands with soap and water before and after you change your bandage (dressing). If you cannot use soap and water, use hand sanitizer. Change your bandage as told by your doctor. Check your insertion site every day for signs of infection. Check for: Redness, swelling, or pain. Bleeding from the site. Warmth. Pus or a bad smell. General instructions Take over-the-counter and prescription medicines only as told by your doctor. Rest as told by your doctor. Go back to your normal activities as told by your doctor. Keep all follow-up visits as told by your doctor. This is important. Contact a doctor if: You have itching or red, swollen areas of skin (hives). You feel worried or nervous (anxious). You feel weak after doing your normal activities. You have redness, swelling, warmth, or pain around the insertion site. You have blood coming from the insertion site, and the blood does not stop with pressure. You have pus or a bad smell coming from the insertion site. Get help right away if: You have signs of a serious reaction. This may be coming from an allergy or the body's defense system (immune system). Signs include: Trouble breathing or shortness of breath. Swelling of the face or feeling warm (flushed). Fever or chills. Head, chest, or back pain. Dark pee (urine) or blood in the pee. Widespread rash. Fast heartbeat. Feeling dizzy or light-headed. You may receive your blood transfusion in an outpatient setting. If so, youwill be told whom to contact to report any reactions. These symptoms may be an emergency. Do not wait to see if the symptoms will go away. Get medical help right away. Call your local emergency services (911 in the [place] Do not drive yourself to the hospital. Summary Bruising and soreness at the IV site are common. Check your insertion site every day for signs of  infection. Rest as told by your doctor. Go back to your normal activities as told by your doctor. Get help right away if you have signs of a serious reaction. This information is not intended to replace advice given to you by your health care provider. Make sure you discuss any questions you have with your healthcare provider. Document Revised: 11/30/2018 Document Reviewed: 11/30/2018 Elsevier Patient Education  [place].

## 2020-12-06 ENCOUNTER — Other Ambulatory Visit: Payer: Self-pay | Admitting: Hematology and Oncology

## 2020-12-06 DIAGNOSIS — K521 Toxic gastroenteritis and colitis: Secondary | ICD-10-CM

## 2020-12-06 DIAGNOSIS — T451X5A Adverse effect of antineoplastic and immunosuppressive drugs, initial encounter: Secondary | ICD-10-CM

## 2020-12-06 DIAGNOSIS — C785 Secondary malignant neoplasm of large intestine and rectum: Secondary | ICD-10-CM

## 2020-12-07 LAB — TYPE AND SCREEN
ABO/RH(D): A POS
Antibody Screen: NEGATIVE
Unit division: 0

## 2020-12-07 LAB — BPAM RBC
Blood Product Expiration Date: 202207072359
ISSUE DATE / TIME: 202206170737
Unit Type and Rh: 6200

## 2020-12-08 ENCOUNTER — Encounter: Payer: Self-pay | Admitting: Oncology

## 2020-12-09 ENCOUNTER — Other Ambulatory Visit: Payer: Self-pay | Admitting: Pharmacist

## 2020-12-09 ENCOUNTER — Encounter: Payer: Self-pay | Admitting: Oncology

## 2020-12-11 ENCOUNTER — Inpatient Hospital Stay: Payer: PPO

## 2020-12-11 ENCOUNTER — Other Ambulatory Visit: Payer: Self-pay

## 2020-12-11 ENCOUNTER — Encounter: Payer: Self-pay | Admitting: Hematology and Oncology

## 2020-12-11 DIAGNOSIS — C50111 Malignant neoplasm of central portion of right female breast: Secondary | ICD-10-CM

## 2020-12-11 DIAGNOSIS — Z170 Estrogen receptor positive status [ER+]: Secondary | ICD-10-CM

## 2020-12-11 LAB — HEPATIC FUNCTION PANEL
ALT: 24 (ref 7–35)
AST: 40 — AB (ref 13–35)
Alkaline Phosphatase: 96 (ref 25–125)
Bilirubin, Total: 0.2

## 2020-12-11 LAB — BASIC METABOLIC PANEL
BUN: 16 (ref 4–21)
CO2: 25 — AB (ref 13–22)
Chloride: 108 (ref 99–108)
Creatinine: 0.8 (ref 0.5–1.1)
Glucose: 85
Potassium: 4.4 (ref 3.4–5.3)
Sodium: 140 (ref 137–147)

## 2020-12-11 LAB — CBC: RBC: 2.66 — AB (ref 3.87–5.11)

## 2020-12-11 LAB — CBC AND DIFFERENTIAL
HCT: 27 — AB (ref 36–46)
Hemoglobin: 8.8 — AB (ref 12.0–16.0)
Neutrophils Absolute: 3.23
Platelets: 474 — AB (ref 150–399)
WBC: 4.2

## 2020-12-11 LAB — COMPREHENSIVE METABOLIC PANEL
Albumin: 3.4 — AB (ref 3.5–5.0)
Calcium: 8.1 — AB (ref 8.7–10.7)

## 2020-12-12 ENCOUNTER — Inpatient Hospital Stay: Payer: PPO

## 2020-12-12 VITALS — BP 108/71 | HR 80 | Temp 98.7°F | Resp 18 | Ht 65.5 in | Wt 117.0 lb

## 2020-12-12 DIAGNOSIS — C50111 Malignant neoplasm of central portion of right female breast: Secondary | ICD-10-CM

## 2020-12-12 DIAGNOSIS — Z170 Estrogen receptor positive status [ER+]: Secondary | ICD-10-CM

## 2020-12-12 DIAGNOSIS — Z5111 Encounter for antineoplastic chemotherapy: Secondary | ICD-10-CM | POA: Diagnosis not present

## 2020-12-12 MED ORDER — HEPARIN SOD (PORK) LOCK FLUSH 100 UNIT/ML IV SOLN
500.0000 [IU] | Freq: Once | INTRAVENOUS | Status: AC | PRN
  Administered 2020-12-12: 500 [IU]
  Filled 2020-12-12: qty 5

## 2020-12-12 MED ORDER — SODIUM CHLORIDE 0.9 % IV SOLN
Freq: Once | INTRAVENOUS | Status: DC
  Filled 2020-12-12: qty 250

## 2020-12-12 MED ORDER — SODIUM CHLORIDE 0.9 % IV SOLN
800.0000 mg/m2 | Freq: Once | INTRAVENOUS | Status: AC
  Administered 2020-12-12: 1330 mg via INTRAVENOUS
  Filled 2020-12-12: qty 26.3

## 2020-12-12 MED ORDER — ONDANSETRON HCL 4 MG/2ML IJ SOLN
INTRAMUSCULAR | Status: AC
  Filled 2020-12-12: qty 4

## 2020-12-12 MED ORDER — SODIUM CHLORIDE 0.9 % IV SOLN
Freq: Once | INTRAVENOUS | Status: AC
Start: 2020-12-12 — End: 2020-12-12
  Filled 2020-12-12: qty 250

## 2020-12-12 MED ORDER — ONDANSETRON HCL 4 MG/2ML IJ SOLN
8.0000 mg | Freq: Once | INTRAMUSCULAR | Status: AC
  Administered 2020-12-12: 8 mg via INTRAVENOUS

## 2020-12-12 NOTE — Progress Notes (Signed)
1154: PT STABLE AT TIME OF DISCHARGE

## 2020-12-12 NOTE — Patient Instructions (Signed)
[facility]  Discharge Instructions: ?Thank you for choosing [facility] to provide your oncology and hematology care.  ?If you have a lab appointment with the Cancer Center, please go directly to the Cancer Center and check in at the registration area. ?  ?Wear comfortable clothing and clothing appropriate for easy access to any Portacath or PICC line.  ? ?We strive to give you quality time with your provider. You may need to reschedule your appointment if you arrive late (15 or more minutes).  Arriving late affects you and other patients whose appointments are after yours.  Also, if you miss three or more appointments without notifying the office, you may be dismissed from the clinic at the provider?s discretion.    ?  ?For prescription refill requests, have your pharmacy contact our office and allow 72 hours for refills to be completed.   ? ?Today you received the following chemotherapy and/or immunotherapy agents Gemcitabine    ?  ?To help prevent nausea and vomiting after your treatment, we encourage you to take your nausea medication as directed. ? ?BELOW ARE SYMPTOMS THAT SHOULD BE REPORTED IMMEDIATELY: ?*FEVER GREATER THAN 100.4 F (38 ?C) OR HIGHER ?*CHILLS OR SWEATING ?*NAUSEA AND VOMITING THAT IS NOT CONTROLLED WITH YOUR NAUSEA MEDICATION ?*UNUSUAL SHORTNESS OF BREATH ?*UNUSUAL BRUISING OR BLEEDING ?*URINARY PROBLEMS (pain or burning when urinating, or frequent urination) ?*BOWEL PROBLEMS (unusual diarrhea, constipation, pain near the anus) ?TENDERNESS IN MOUTH AND THROAT WITH OR WITHOUT PRESENCE OF ULCERS (sore throat, sores in mouth, or a toothache) ?UNUSUAL RASH, SWELLING OR PAIN  ?UNUSUAL VAGINAL DISCHARGE OR ITCHING  ? ?Items with * indicate a potential emergency and should be followed up as soon as possible or go to the Emergency Department if any problems should occur. ? ?Please show the CHEMOTHERAPY ALERT CARD or IMMUNOTHERAPY ALERT CARD at check-in to the  Emergency Department and triage nurse. ? ?Should you have questions after your visit or need to cancel or reschedule your appointment, please contact [facility]  Dept: [phone]  and follow the prompts.  Office hours are 8:00 a.m. to 4:30 p.m. Monday - Friday. Please note that voicemails left after 4:00 p.m. may not be returned until the following business day.  We are closed weekends and major holidays. You have access to a nurse at all times for urgent questions. Please call the main number to the clinic Dept: [phone] and follow the prompts. ? ?For any non-urgent questions, you may also contact your provider using MyChart. We now offer e-Visits for anyone 18 and older to request care online for non-urgent symptoms. For details visit [email or web address]. ?  ?Also download the MyChart app! Go to the app store, search "MyChart", open the app, select [facility], and log in with your MyChart username and password. ? ?Due to Covid, a mask is required upon entering the hospital/clinic. If you do not have a mask, one will be given to you upon arrival. For doctor visits, patients may have 1 support person aged 18 or older with them. For treatment visits, patients cannot have anyone with them due to current Covid guidelines and our immunocompromised population.  ? ? ?

## 2020-12-15 ENCOUNTER — Encounter: Payer: Self-pay | Admitting: Oncology

## 2020-12-15 ENCOUNTER — Other Ambulatory Visit: Payer: Self-pay

## 2020-12-15 ENCOUNTER — Inpatient Hospital Stay: Payer: PPO

## 2020-12-15 VITALS — BP 113/74 | HR 75 | Temp 98.1°F | Resp 18 | Ht 65.5 in | Wt 118.0 lb

## 2020-12-15 DIAGNOSIS — Z5111 Encounter for antineoplastic chemotherapy: Secondary | ICD-10-CM | POA: Diagnosis not present

## 2020-12-15 DIAGNOSIS — C50111 Malignant neoplasm of central portion of right female breast: Secondary | ICD-10-CM

## 2020-12-15 MED ORDER — PEGFILGRASTIM-JMDB 6 MG/0.6ML ~~LOC~~ SOSY
PREFILLED_SYRINGE | SUBCUTANEOUS | Status: AC
  Filled 2020-12-15: qty 0.6

## 2020-12-15 MED ORDER — PEGFILGRASTIM-JMDB 6 MG/0.6ML ~~LOC~~ SOSY
6.0000 mg | PREFILLED_SYRINGE | Freq: Once | SUBCUTANEOUS | Status: AC
Start: 2020-12-15 — End: 2020-12-15
  Administered 2020-12-15: 6 mg via SUBCUTANEOUS

## 2020-12-15 NOTE — Patient Instructions (Signed)

## 2020-12-17 ENCOUNTER — Telehealth: Payer: Self-pay | Admitting: Oncology

## 2020-12-17 NOTE — Telephone Encounter (Signed)
Patient requested Sandostatin Injection be the Same day as Infusion.  Ok per S Phy.  Rescheduled 7/1 Injection to 7/8

## 2020-12-18 NOTE — Progress Notes (Signed)
[facility]  [address] [place],  [place]  [address] [phone]  Clinic Day:  12/26/2020  Referring physician: Greig Right, MD  This document serves as a record of services personally performed by Dequincy Macarthur Critchley, MD. It was created on their behalf by [facility] E, a trained medical scribe. The creation of this record is based on the scribe's personal observations and the provider's statements to them.  HISTORY OF PRESENT ILLNESS:  The patient is a 60 y.o. female with metastatic hormone positive breast cancer, which includes spread of disease to her stomach.  She comes in today to be evaluated before heading into her 4th cycle of carboplatin/gemcitabine.  The patient claims to have tolerated her 3rd cycle of chemotherapy fairly well.  She still has intermittent diarrhea and abdominal pain, which has been manageable to date.   She denies having any new symptoms or findings which concern her for disease progression.  PHYSICAL EXAM:  Blood pressure (!) 103/59, pulse 88, temperature 98 F (36.7 C), resp. rate 16, height 5' 5.5" (1.664 m), weight 125 lb 14.4 oz (57.1 kg), SpO2 98 %. Wt Readings from Last 3 Encounters:  12/25/20 125 lb 14.4 oz (57.1 kg)  12/15/20 118 lb (53.5 kg)  12/12/20 117 lb (53.1 kg)   Body mass index is 20.63 kg/m. Performance status (ECOG): 1 Physical Exam Constitutional:      Appearance: Normal appearance. She is not ill-appearing.  HENT:     Mouth/Throat:     Mouth: Mucous membranes are moist.     Pharynx: Oropharynx is clear. No oropharyngeal exudate or posterior oropharyngeal erythema.  Cardiovascular:     Rate and Rhythm: Normal rate and regular rhythm.     Heart sounds: No murmur heard.   No friction rub. No gallop.  Pulmonary:     Effort: Pulmonary effort is normal. No respiratory distress.     Breath sounds: Normal breath sounds. No wheezing, rhonchi or rales.  Chest:  Breasts:    Right: No axillary  adenopathy or supraclavicular adenopathy.     Left: No axillary adenopathy or supraclavicular adenopathy.  Abdominal:     General: Bowel sounds are normal. There is no distension.     Palpations: Abdomen is soft. There is no mass.     Tenderness: There is no abdominal tenderness.  Musculoskeletal:        General: No swelling.     Right lower leg: No edema.     Left lower leg: No edema.  Lymphadenopathy:     Cervical: No cervical adenopathy.     Upper Body:     Right upper body: No supraclavicular or axillary adenopathy.     Left upper body: No supraclavicular or axillary adenopathy.     Lower Body: No right inguinal adenopathy. No left inguinal adenopathy.  Skin:    General: Skin is warm.     Coloration: Skin is not jaundiced.     Findings: No lesion or rash.  Neurological:     General: No focal deficit present.     Mental Status: She is alert and oriented to person, place, and time. Mental status is at baseline.     Cranial Nerves: Cranial nerves are intact.  Psychiatric:        Mood and Affect: Mood normal.        Behavior: Behavior normal.        Thought Content: Thought content normal.   LABS:   ASSESSMENT & PLAN:  Assessment/Plan:  A 60 y.o. female with metastatic hormone positive breast cancer. She will proceed with her 4th cycle of carboplatin/gemcitabine tomorrow.  Carboplatin is being given on day 1; gemcitabine is being given on days 1 and 8.  As her hemoglobin is lower and she is feeling weaker, I will give her Retacrit 20,000 units with this cycle of treatment.  Overall, the patient appears to be doing well.  I will see her back in 3 weeks before she heads into her 5th cycle of carboplatin/gemcitabine.  CT scans will be done a day before her next visit to ascertain her new disease baseline after 4 cycles of chemotherapy.  The patient understands all the plans discussed today and is in agreement with them.   I, Rita Ohara, am acting as scribe for Marice Potter, MD     I have reviewed this report as typed by the medical scribe, and it is complete and accurate.  Dequincy Macarthur Critchley, MD

## 2020-12-19 ENCOUNTER — Ambulatory Visit: Payer: PPO

## 2020-12-25 ENCOUNTER — Inpatient Hospital Stay: Payer: PPO | Admitting: Hematology and Oncology

## 2020-12-25 ENCOUNTER — Telehealth: Payer: Self-pay | Admitting: Oncology

## 2020-12-25 ENCOUNTER — Inpatient Hospital Stay: Payer: PPO | Attending: Oncology | Admitting: Oncology

## 2020-12-25 ENCOUNTER — Other Ambulatory Visit: Payer: Self-pay | Admitting: Hematology and Oncology

## 2020-12-25 ENCOUNTER — Other Ambulatory Visit: Payer: Self-pay | Admitting: Oncology

## 2020-12-25 ENCOUNTER — Other Ambulatory Visit: Payer: Self-pay

## 2020-12-25 VITALS — BP 103/59 | HR 88 | Temp 98.0°F | Resp 16 | Ht 65.5 in | Wt 125.9 lb

## 2020-12-25 DIAGNOSIS — D649 Anemia, unspecified: Secondary | ICD-10-CM | POA: Insufficient documentation

## 2020-12-25 DIAGNOSIS — Z5111 Encounter for antineoplastic chemotherapy: Secondary | ICD-10-CM | POA: Diagnosis not present

## 2020-12-25 DIAGNOSIS — C50111 Malignant neoplasm of central portion of right female breast: Secondary | ICD-10-CM

## 2020-12-25 DIAGNOSIS — Z170 Estrogen receptor positive status [ER+]: Secondary | ICD-10-CM

## 2020-12-25 DIAGNOSIS — C50919 Malignant neoplasm of unspecified site of unspecified female breast: Secondary | ICD-10-CM | POA: Diagnosis not present

## 2020-12-25 DIAGNOSIS — Z79899 Other long term (current) drug therapy: Secondary | ICD-10-CM | POA: Insufficient documentation

## 2020-12-25 DIAGNOSIS — C7889 Secondary malignant neoplasm of other digestive organs: Secondary | ICD-10-CM | POA: Diagnosis not present

## 2020-12-25 DIAGNOSIS — Z5189 Encounter for other specified aftercare: Secondary | ICD-10-CM | POA: Diagnosis not present

## 2020-12-25 DIAGNOSIS — C785 Secondary malignant neoplasm of large intestine and rectum: Secondary | ICD-10-CM | POA: Diagnosis not present

## 2020-12-25 LAB — FOLATE: Folate: 11.5 ng/mL (ref >5.9)

## 2020-12-25 LAB — IRON AND TIBC
Iron: 66 ug/dL (ref 28–170)
Saturation Ratios: 20 % (ref 10.4–31.8)
TIBC: 328 ug/dL (ref 250–450)
UIBC: 262 ug/dL

## 2020-12-25 LAB — BASIC METABOLIC PANEL
BUN: 13 (ref 4–21)
CO2: 21 (ref 13–22)
Chloride: 110 — AB (ref 99–108)
Creatinine: 0.8 (ref 0.5–1.1)
Glucose: 121
Potassium: 4 (ref 3.4–5.3)
Sodium: 140 (ref 137–147)

## 2020-12-25 LAB — CBC: RBC: 2.38 — AB (ref 3.87–5.11)

## 2020-12-25 LAB — CBC AND DIFFERENTIAL
HCT: 25 — AB (ref 36–46)
Hemoglobin: 8 — AB (ref 12.0–16.0)
Neutrophils Absolute: 14.52
Platelets: 159 (ref 150–399)
WBC: 19.1

## 2020-12-25 LAB — HEPATIC FUNCTION PANEL
ALT: 19 (ref 7–35)
AST: 30 (ref 13–35)
Alkaline Phosphatase: 132 — AB (ref 25–125)
Bilirubin, Total: 0.2

## 2020-12-25 LAB — COMPREHENSIVE METABOLIC PANEL
Albumin: 3.4 — AB (ref 3.5–5.0)
Calcium: 7.5 — AB (ref 8.7–10.7)

## 2020-12-25 LAB — FERRITIN: Ferritin: 272 ng/mL (ref 11–307)

## 2020-12-25 LAB — VITAMIN B12: Vitamin B-12: 353 pg/mL (ref 180–914)

## 2020-12-25 NOTE — Addendum Note (Signed)
Addended by: Juanetta Beets on: 12/25/2020 11:26 AM   Modules accepted: Orders

## 2020-12-25 NOTE — Telephone Encounter (Signed)
Per 7/7 los next appt scheduled and given to patient 

## 2020-12-26 ENCOUNTER — Inpatient Hospital Stay: Payer: PPO

## 2020-12-26 ENCOUNTER — Encounter: Payer: Self-pay | Admitting: Oncology

## 2020-12-26 ENCOUNTER — Other Ambulatory Visit: Payer: Self-pay

## 2020-12-26 VITALS — BP 126/59 | HR 81 | Temp 97.8°F | Resp 18 | Ht 65.5 in | Wt 125.8 lb

## 2020-12-26 DIAGNOSIS — R197 Diarrhea, unspecified: Secondary | ICD-10-CM

## 2020-12-26 DIAGNOSIS — Z5111 Encounter for antineoplastic chemotherapy: Secondary | ICD-10-CM | POA: Diagnosis not present

## 2020-12-26 DIAGNOSIS — C50111 Malignant neoplasm of central portion of right female breast: Secondary | ICD-10-CM

## 2020-12-26 MED ORDER — EPOETIN ALFA-EPBX 10000 UNIT/ML IJ SOLN
INTRAMUSCULAR | Status: AC
  Filled 2020-12-26: qty 2

## 2020-12-26 MED ORDER — SODIUM CHLORIDE 0.9 % IV SOLN
Freq: Once | INTRAVENOUS | Status: DC
  Filled 2020-12-26: qty 250

## 2020-12-26 MED ORDER — SODIUM CHLORIDE 0.9 % IV SOLN
800.0000 mg/m2 | Freq: Once | INTRAVENOUS | Status: AC
  Administered 2020-12-26: 1330 mg via INTRAVENOUS
  Filled 2020-12-26: qty 26.3

## 2020-12-26 MED ORDER — PALONOSETRON HCL INJECTION 0.25 MG/5ML
INTRAVENOUS | Status: AC
  Filled 2020-12-26: qty 5

## 2020-12-26 MED ORDER — SODIUM CHLORIDE 0.9 % IV SOLN
10.0000 mg | Freq: Once | INTRAVENOUS | Status: AC
  Administered 2020-12-26: 10 mg via INTRAVENOUS
  Filled 2020-12-26: qty 10

## 2020-12-26 MED ORDER — EPOETIN ALFA-EPBX 40000 UNIT/ML IJ SOLN: 20000.0000 [IU] | Freq: Once | INTRAMUSCULAR | Status: DC

## 2020-12-26 MED ORDER — FOSAPREPITANT DIMEGLUMINE INJECTION 150 MG
150.0000 mg | Freq: Once | INTRAVENOUS | Status: AC
  Administered 2020-12-26: 150 mg via INTRAVENOUS
  Filled 2020-12-26: qty 150

## 2020-12-26 MED ORDER — OCTREOTIDE ACETATE 30 MG IM KIT
30.0000 mg | PACK | Freq: Once | INTRAMUSCULAR | Status: AC
  Administered 2020-12-26: 30 mg via INTRAMUSCULAR

## 2020-12-26 MED ORDER — SODIUM CHLORIDE 0.9 % IV SOLN
Freq: Once | INTRAVENOUS | Status: AC
Start: 2020-12-26 — End: 2020-12-26
  Filled 2020-12-26: qty 250

## 2020-12-26 MED ORDER — PALONOSETRON HCL INJECTION 0.25 MG/5ML
0.2500 mg | Freq: Once | INTRAVENOUS | Status: AC
  Administered 2020-12-26: 0.25 mg via INTRAVENOUS

## 2020-12-26 MED ORDER — EPOETIN ALFA-EPBX 10000 UNIT/ML IJ SOLN
10000.0000 [IU] | Freq: Once | INTRAMUSCULAR | Status: AC
  Administered 2020-12-26: 10000 [IU] via SUBCUTANEOUS

## 2020-12-26 MED ORDER — OCTREOTIDE ACETATE 30 MG IM KIT
PACK | INTRAMUSCULAR | Status: AC
  Filled 2020-12-26: qty 1

## 2020-12-26 MED ORDER — HEPARIN SOD (PORK) LOCK FLUSH 100 UNIT/ML IV SOLN
500.0000 [IU] | Freq: Once | INTRAVENOUS | Status: AC | PRN
  Administered 2020-12-26: 500 [IU]
  Filled 2020-12-26: qty 5

## 2020-12-26 MED ORDER — SODIUM CHLORIDE 0.9 % IV SOLN
430.0000 mg | Freq: Once | INTRAVENOUS | Status: AC
  Administered 2020-12-26: 430 mg via INTRAVENOUS
  Filled 2020-12-26: qty 43

## 2020-12-26 NOTE — Progress Notes (Signed)
1225: PT STABLE AT TIME OF DISCHARGE

## 2020-12-26 NOTE — Patient Instructions (Addendum)
[facility]  Discharge Instructions: Thank you for choosing [facility] to provide your oncology and hematology care.  If you have a lab appointment with the [facility], please go directly to the [facility] and check in at the registration area.   Wear comfortable clothing and clothing appropriate for easy access to any Portacath or PICC line.   We strive to give you quality time with your provider. You may need to reschedule your appointment if you arrive late (15 or more minutes).  Arriving late affects you and other patients whose appointments are after yours.  Also, if you miss three or more appointments without notifying the office, you may be dismissed from the clinic at the provider's discretion.      For prescription refill requests, have your pharmacy contact our office and allow 72 hours for refills to be completed.    Today you received the following chemotherapy and/or immunotherapy agents Carboplatin,Gemcitabine    To help prevent nausea and vomiting after your treatment, we encourage you to take your nausea medication as directed.  BELOW ARE SYMPTOMS THAT SHOULD BE REPORTED IMMEDIATELY: *FEVER GREATER THAN 100.4 F (38 C) OR HIGHER *CHILLS OR SWEATING *NAUSEA AND VOMITING THAT IS NOT CONTROLLED WITH YOUR NAUSEA MEDICATION *UNUSUAL SHORTNESS OF BREATH *UNUSUAL BRUISING OR BLEEDING *URINARY PROBLEMS (pain or burning when urinating, or frequent urination) *BOWEL PROBLEMS (unusual diarrhea, constipation, pain near the anus) TENDERNESS IN MOUTH AND THROAT WITH OR WITHOUT PRESENCE OF ULCERS (sore throat, sores in mouth, or a toothache) UNUSUAL RASH, SWELLING OR PAIN  UNUSUAL VAGINAL DISCHARGE OR ITCHING   Items with * indicate a potential emergency and should be followed up as soon as possible or go to the Emergency Department if any problems should occur.  Please show the CHEMOTHERAPY ALERT CARD or IMMUNOTHERAPY ALERT CARD at check-in  to the Emergency Department and triage nurse.  Should you have questions after your visit or need to cancel or reschedule your appointment, please contact [facility]  Dept: [phone]  and follow the prompts.  Office hours are 8:00 a.m. to 4:30 p.m. Monday - Friday. Please note that voicemails left after 4:00 p.m. may not be returned until the following business day.  We are closed weekends and major holidays. You have access to a nurse at all times for urgent questions. Please call the main number to the clinic Dept: [phone] and follow the prompts.  For any non-urgent questions, you may also contact your provider using MyChart. We now offer e-Visits for anyone 25 and older to request care online for non-urgent symptoms. For details visit [email or web address].   Also download the MyChart app! Go to the app store, search "MyChart", open the app, select [facility], and log in with your MyChart username and password.  Due to Covid, a mask is required upon entering the hospital/clinic. If you do not have a mask, one will be given to you upon arrival. For doctor visits, patients may have 1 support person aged 57 or older with them. For treatment visits, patients cannot have anyone with them due to current Covid guidelines and our immunocompromised population.   Octreotide injection solution What is this medication? OCTREOTIDE (ok TREE oh tide) is used to reduce blood levels of growth hormone in patients with a condition called acromegaly. This medicine also reducesflushing and watery diarrhea caused by certain types of cancer. This medicine may be used for other purposes; ask your health care  provider orpharmacist if you have questions. COMMON BRAND NAME(S): Leatha Gilding, Sandostatin What should I tell my care team before I take this medication? They need to know if you have any of these conditions: diabetes gallbladder disease kidney disease liver disease thyroid  disease an unusual or allergic reaction to octreotide, other medicines, foods, dyes, or preservatives pregnant or trying to get pregnant breast-feeding How should I use this medication? This medicine is for injection under the skin or into a vein (only in emergency situations). It is usually given by a health care professional in a hospital orclinic setting. If you get this medicine at home, you will be taught how to prepare and give this medicine. Allow the injection solution to come to room temperature before use. Do not warm it artificially. Use exactly as directed. Take your medicineat regular intervals. Do not take your medicine more often than directed. It is important that you put your used needles and syringes in a special sharps container. Do not put them in a trash can. If you do not have a sharpscontainer, call your pharmacist or healthcare provider to get one. Talk to your pediatrician regarding the use of this medicine in children.Special care may be needed. Overdosage: If you think you have taken too much of this medicine contact apoison control center or emergency room at once. NOTE: This medicine is only for you. Do not share this medicine with others. What if I miss a dose? If you miss a dose, take it as soon as you can. If it is almost time for yournext dose, take only that dose. Do not take double or extra doses. What may interact with this medication? bromocriptine certain medicines for blood pressure, heart disease, irregular heartbeat cyclosporine diuretics medicines for diabetes, including insulin quinidine This list may not describe all possible interactions. Give your health care provider a list of all the medicines, herbs, non-prescription drugs, or dietary supplements you use. Also tell them if you smoke, drink alcohol, or use illegaldrugs. Some items may interact with your medicine. What should I watch for while using this medication? Visit your doctor or health care  professional for regular checks on yourprogress. To help reduce irritation at the injection site, use a different site for eachinjection and make sure the solution is at room temperature before use. This medicine may cause decreases in blood sugar. Signs of low blood sugar include chills, cool, pale skin or cold sweats, drowsiness, extreme hunger, fast heartbeat, headache, nausea, nervousness or anxiety, shakiness, trembling, unsteadiness, tiredness, or weakness. Contact your doctor or health careprofessional right away if you experience any of these symptoms. This medicine may increase blood sugar. Ask your healthcare provider if changesin diet or medicines are needed if you have diabetes. This medicine may cause a decrease in vitamin B12. You should make sure that you get enough vitamin B12 while you are taking this medicine. Discuss thefoods you eat and the vitamins you take with your health care professional. What side effects may I notice from receiving this medication? Side effects that you should report to your doctor or health care professionalas soon as possible: allergic reactions like skin rash, itching or hives, swelling of the face, lips, or tongue fast, slow, or irregular heartbeat right upper belly pain severe stomach pain signs and symptoms of high blood sugar such as being more thirsty or hungry or having to urinate more than normal. You may also feel very tired or have blurry vision. signs and symptoms of low blood sugar such  as feeling anxious; confusion; dizziness; increased hunger; unusually weak or tired; increased sweating; shakiness; cold, clammy skin; irritable; headache; blurred vision; fast heartbeat; loss of consciousness unusually weak or tired Side effects that usually do not require medical attention (report to yourdoctor or health care professional if they continue or are bothersome): diarrhea dizziness gas headache nausea, vomiting pain, redness, or irritation at  site where injected upset stomach This list may not describe all possible side effects. Call your doctor for medical advice about side effects. You may report side effects to FDA at1-800-FDA-1088. Where should I keep my medication? Keep out of the reach of children. Store in a refrigerator between 2 and 8 degrees C (36 and 46 degrees F). Protect from light. Allow to come to room temperature naturally. Do not use artificial heat. If protected from light, the injection may be stored at room temperature between 20 and 30 degrees C (70 and 86 degrees F) for 14 days. After the initial use, throw away any unused portion of a multiple dose vialafter 14 days. Throw away unused portions of the ampules after use. NOTE: This sheet is a summary. It may not cover all possible information. If you have questions about this medicine, talk to your doctor, pharmacist, orhealth care provider.  2022 Elsevier/Gold Standard (2019-01-04 13:33:09) Epoetin Alfa injection What is this medication? EPOETIN ALFA (e POE e tin AL fa) helps your body make more red blood cells. This medicine is used to treat anemia caused by chronic kidney disease, cancer chemotherapy, or HIV-therapy. It may also be used before surgery if you haveanemia. This medicine may be used for other purposes; ask your health care provider orpharmacist if you have questions. COMMON BRAND NAME(S): Epogen, Procrit, Retacrit What should I tell my care team before I take this medication? They need to know if you have any of these conditions: cancer heart disease high blood pressure history of blood clots history of stroke low levels of folate, iron, or vitamin B12 in the blood seizures an unusual or allergic reaction to erythropoietin, albumin, benzyl alcohol, hamster proteins, other medicines, foods, dyes, or preservatives pregnant or trying to get pregnant breast-feeding How should I use this medication? This medicine is for injection into a vein or  under the skin. It is [place] by a health care professional in a hospital or clinic setting. If you get this medicine at home, you will be taught how to prepare and give this medicine. Use exactly as directed. Take your medicine at regularintervals. Do not take your medicine more often than directed. It is important that you put your used needles and syringes in a special sharps container. Do not put them in a trash can. If you do not have a sharpscontainer, call your pharmacist or healthcare provider to get one. A special MedGuide will be given to you by the pharmacist with eachprescription and refill. Be sure to read this information carefully each time. Talk to your pediatrician regarding the use of this medicine in children. Whilethis drug may be prescribed for selected conditions, precautions do apply. Overdosage: If you think you have taken too much of this medicine contact apoison control center or emergency room at once. NOTE: This medicine is only for you. Do not share this medicine with others. What if I miss a dose? If you miss a dose, take it as soon as you can. If it is almost time for yournext dose, take only that dose. Do not take double or extra doses. What may interact  with this medication? Interactions have not been studied. This list may not describe all possible interactions. Give your health care provider a list of all the medicines, herbs, non-prescription drugs, or dietary supplements you use. Also tell them if you smoke, drink alcohol, or use illegaldrugs. Some items may interact with your medicine. What should I watch for while using this medication? Your condition will be monitored carefully while you are receiving thismedicine. You may need blood work done while you are taking this medicine. This medicine may cause a decrease in vitamin B6. You should make sure that you get enough vitamin B6 while you are taking this medicine. Discuss the foods youeat and the vitamins  you take with your health care professional. What side effects may I notice from receiving this medication? Side effects that you should report to your doctor or health care professionalas soon as possible: allergic reactions like skin rash, itching or hives, swelling of the face, lips, or tongue seizures signs and symptoms of a blood clot such as breathing problems; changes in vision; chest pain; severe, sudden headache; pain, swelling, warmth in the leg; trouble speaking; sudden numbness or weakness of the face, arm or leg signs and symptoms of a stroke like changes in vision; confusion; trouble speaking or understanding; severe headaches; sudden numbness or weakness of the face, arm or leg; trouble walking; dizziness; loss of balance or coordination Side effects that usually do not require medical attention (report to yourdoctor or health care professional if they continue or are bothersome): chills cough dizziness fever headaches joint pain muscle cramps muscle pain nausea, vomiting pain, redness, or irritation at site where injected This list may not describe all possible side effects. Call your doctor for medical advice about side effects. You may report side effects to FDA at1-800-FDA-1088. Where should I keep my medication? Keep out of the reach of children. Store in a refrigerator between 2 and 8 degrees C (36 and 46 degrees F). Do not freeze or shake. Throw away any unused portion if using a single-dose vial. Multi-dose vials can be kept in the refrigerator for up to 21 days after theinitial dose. Throw away unused medicine. NOTE: This sheet is a summary. It may not cover all possible information. If you have questions about this medicine, talk to your doctor, pharmacist, orhealth care provider.  2022 Elsevier/Gold Standard (2017-01-14 08:35:19)

## 2021-01-01 ENCOUNTER — Inpatient Hospital Stay: Payer: PPO

## 2021-01-01 ENCOUNTER — Other Ambulatory Visit: Payer: Self-pay

## 2021-01-01 ENCOUNTER — Other Ambulatory Visit: Payer: Self-pay | Admitting: Hematology and Oncology

## 2021-01-01 ENCOUNTER — Encounter: Payer: Self-pay | Admitting: Hematology and Oncology

## 2021-01-01 ENCOUNTER — Other Ambulatory Visit: Payer: Self-pay | Admitting: Oncology

## 2021-01-01 DIAGNOSIS — C50111 Malignant neoplasm of central portion of right female breast: Secondary | ICD-10-CM

## 2021-01-01 DIAGNOSIS — Z170 Estrogen receptor positive status [ER+]: Secondary | ICD-10-CM

## 2021-01-01 DIAGNOSIS — Z5111 Encounter for antineoplastic chemotherapy: Secondary | ICD-10-CM | POA: Diagnosis not present

## 2021-01-01 LAB — HEPATIC FUNCTION PANEL
ALT: 35 (ref 7–35)
AST: 45 — AB (ref 13–35)
Alkaline Phosphatase: 103 (ref 25–125)
Bilirubin, Total: 0.4

## 2021-01-01 LAB — CBC AND DIFFERENTIAL
HCT: 23 — AB (ref 36–46)
Hemoglobin: 7.6 — AB (ref 12.0–16.0)
Neutrophils Absolute: 3.21
Platelets: 304 (ref 150–399)
WBC: 4.4

## 2021-01-01 LAB — BASIC METABOLIC PANEL
BUN: 18 (ref 4–21)
CO2: 21 (ref 13–22)
Chloride: 106 (ref 99–108)
Creatinine: 0.8 (ref 0.5–1.1)
Glucose: 104
Potassium: 3.8 (ref 3.4–5.3)
Sodium: 140 (ref 137–147)

## 2021-01-01 LAB — CBC: RBC: 2.16 — AB (ref 3.87–5.11)

## 2021-01-01 LAB — COMPREHENSIVE METABOLIC PANEL
Albumin: 3.9 (ref 3.5–5.0)
Calcium: 8 — AB (ref 8.7–10.7)

## 2021-01-01 LAB — PREPARE RBC (CROSSMATCH)

## 2021-01-01 MED ORDER — SODIUM CHLORIDE 0.9% FLUSH
10.0000 mL | INTRAVENOUS | Status: AC | PRN
  Filled 2021-01-01: qty 10

## 2021-01-01 MED ORDER — HEPARIN SOD (PORK) LOCK FLUSH 100 UNIT/ML IV SOLN
500.0000 [IU] | Freq: Every day | INTRAVENOUS | Status: AC | PRN
Start: 2021-01-01 — End: ?
  Filled 2021-01-01: qty 5

## 2021-01-01 MED ORDER — SODIUM CHLORIDE 0.9% FLUSH
3.0000 mL | INTRAVENOUS | Status: AC | PRN
  Filled 2021-01-01: qty 10

## 2021-01-01 MED ORDER — SODIUM CHLORIDE 0.9% IV SOLUTION
250.0000 mL | Freq: Once | INTRAVENOUS | Status: AC
  Filled 2021-01-01: qty 250

## 2021-01-01 MED ORDER — HEPARIN SOD (PORK) LOCK FLUSH 100 UNIT/ML IV SOLN
250.0000 [IU] | INTRAVENOUS | Status: AC | PRN
  Filled 2021-01-01: qty 5

## 2021-01-01 NOTE — Progress Notes (Signed)
Ok to proceed with gemcitabine despite hemoglobin=7.6 per Dr. Bobby Rumpf.  Pt will receive 1 unit of PRBC tomorrow prior to chemo.

## 2021-01-02 ENCOUNTER — Inpatient Hospital Stay: Payer: PPO

## 2021-01-02 VITALS — BP 103/63 | HR 78 | Temp 98.5°F | Resp 18 | Ht 65.5 in | Wt 125.0 lb

## 2021-01-02 DIAGNOSIS — Z5111 Encounter for antineoplastic chemotherapy: Secondary | ICD-10-CM | POA: Diagnosis not present

## 2021-01-02 DIAGNOSIS — C50111 Malignant neoplasm of central portion of right female breast: Secondary | ICD-10-CM

## 2021-01-02 LAB — PREPARE RBC (CROSSMATCH)

## 2021-01-02 MED ORDER — ACETAMINOPHEN 325 MG PO TABS
ORAL_TABLET | ORAL | Status: AC
  Filled 2021-01-02: qty 2

## 2021-01-02 MED ORDER — HEPARIN SOD (PORK) LOCK FLUSH 100 UNIT/ML IV SOLN
500.0000 [IU] | Freq: Once | INTRAVENOUS | Status: AC | PRN
  Administered 2021-01-02: 500 [IU]
  Filled 2021-01-02: qty 5

## 2021-01-02 MED ORDER — DIPHENHYDRAMINE HCL 25 MG PO CAPS
25.0000 mg | ORAL_CAPSULE | Freq: Once | ORAL | Status: AC
  Administered 2021-01-02: 25 mg via ORAL

## 2021-01-02 MED ORDER — SODIUM CHLORIDE 0.9 % IV SOLN
Freq: Once | INTRAVENOUS | Status: DC
  Filled 2021-01-02: qty 250

## 2021-01-02 MED ORDER — ONDANSETRON HCL 4 MG/2ML IJ SOLN
INTRAMUSCULAR | Status: AC
  Filled 2021-01-02: qty 4

## 2021-01-02 MED ORDER — DIPHENHYDRAMINE HCL 25 MG PO CAPS
ORAL_CAPSULE | ORAL | Status: AC
  Filled 2021-01-02: qty 1

## 2021-01-02 MED ORDER — ACETAMINOPHEN 325 MG PO TABS
650.0000 mg | ORAL_TABLET | Freq: Once | ORAL | Status: AC
  Administered 2021-01-02: 650 mg via ORAL

## 2021-01-02 MED ORDER — SODIUM CHLORIDE 0.9 % IV SOLN
800.0000 mg/m2 | Freq: Once | INTRAVENOUS | Status: AC
  Administered 2021-01-02: 1330 mg via INTRAVENOUS
  Filled 2021-01-02: qty 26.3

## 2021-01-02 MED ORDER — ONDANSETRON HCL 4 MG/2ML IJ SOLN
8.0000 mg | Freq: Once | INTRAMUSCULAR | Status: AC
  Administered 2021-01-02: 8 mg via INTRAVENOUS

## 2021-01-02 MED ORDER — SODIUM CHLORIDE 0.9 % IV SOLN
Freq: Once | INTRAVENOUS | Status: AC
  Filled 2021-01-02: qty 250

## 2021-01-02 NOTE — Patient Instructions (Signed)
[facility]  Discharge Instructions: Thank you for choosing [facility] to provide your oncology and hematology care.  If you have a lab appointment with the [facility], please go directly to the [facility] and check in at the registration area.   Wear comfortable clothing and clothing appropriate for easy access to any Portacath or PICC line.   We strive to give you quality time with your provider. You may need to reschedule your appointment if you arrive late (15 or more minutes).  Arriving late affects you and other patients whose appointments are after yours.  Also, if you miss three or more appointments without notifying the office, you may be dismissed from the clinic at the provider's discretion.      For prescription refill requests, have your pharmacy contact our office and allow 72 hours for refills to be completed.    Today you received the following chemotherapy and/or immunotherapy agents Gemcitabine   To help prevent nausea and vomiting after your treatment, we encourage you to take your nausea medication as directed.  BELOW ARE SYMPTOMS THAT SHOULD BE REPORTED IMMEDIATELY: *FEVER GREATER THAN 100.4 F (38 C) OR HIGHER *CHILLS OR SWEATING *NAUSEA AND VOMITING THAT IS NOT CONTROLLED WITH YOUR NAUSEA MEDICATION *UNUSUAL SHORTNESS OF BREATH *UNUSUAL BRUISING OR BLEEDING *URINARY PROBLEMS (pain or burning when urinating, or frequent urination) *BOWEL PROBLEMS (unusual diarrhea, constipation, pain near the anus) TENDERNESS IN MOUTH AND THROAT WITH OR WITHOUT PRESENCE OF ULCERS (sore throat, sores in mouth, or a toothache) UNUSUAL RASH, SWELLING OR PAIN  UNUSUAL VAGINAL DISCHARGE OR ITCHING   Items with * indicate a potential emergency and should be followed up as soon as possible or go to the Emergency Department if any problems should occur.  Please show the CHEMOTHERAPY ALERT CARD or IMMUNOTHERAPY ALERT CARD at check-in to the  Emergency Department and triage nurse.  Should you have questions after your visit or need to cancel or reschedule your appointment, please contact [facility]  Dept: [phone]  and follow the prompts.  Office hours are 8:00 a.m. to 4:30 p.m. Monday - Friday. Please note that voicemails left after 4:00 p.m. may not be returned until the following business day.  We are closed weekends and major holidays. You have access to a nurse at all times for urgent questions. Please call the main number to the clinic Dept: [phone] and follow the prompts.  For any non-urgent questions, you may also contact your provider using MyChart. We now offer e-Visits for anyone 40 and older to request care online for non-urgent symptoms. For details visit [email or web address].   Also download the MyChart app! Go to the app store, search "MyChart", open the app, select [facility], and log in with your MyChart username and password.  Due to Covid, a mask is required upon entering the hospital/clinic. If you do not have a mask, one will be given to you upon arrival. For doctor visits, patients may have 1 support person aged 31 or older with them. For treatment visits, patients cannot have anyone with them due to current Covid guidelines and our immunocompromised population.   Blood Transfusion, Adult, Care After This sheet gives you information about how to care for yourself after your procedure. Your doctor may also give you more specific instructions. If youhave problems or questions, contact your doctor. What can I expect after the procedure? After the procedure, it is common to have: Bruising and soreness  at the IV site. A fever or chills on the day of the procedure. This may be your body's response to the new blood cells received. A headache. Follow these instructions at home: Insertion site care     Follow instructions from your doctor about how to take care of your insertion  site. This is where an IV tube was put into your vein. Make sure you: Wash your hands with soap and water before and after you change your bandage (dressing). If you cannot use soap and water, use hand sanitizer. Change your bandage as told by your doctor. Check your insertion site every day for signs of infection. Check for: Redness, swelling, or pain. Bleeding from the site. Warmth. Pus or a bad smell. General instructions Take over-the-counter and prescription medicines only as told by your doctor. Rest as told by your doctor. Go back to your normal activities as told by your doctor. Keep all follow-up visits as told by your doctor. This is important. Contact a doctor if: You have itching or red, swollen areas of skin (hives). You feel worried or nervous (anxious). You feel weak after doing your normal activities. You have redness, swelling, warmth, or pain around the insertion site. You have blood coming from the insertion site, and the blood does not stop with pressure. You have pus or a bad smell coming from the insertion site. Get help right away if: You have signs of a serious reaction. This may be coming from an allergy or the body's defense system (immune system). Signs include: Trouble breathing or shortness of breath. Swelling of the face or feeling warm (flushed). Fever or chills. Head, chest, or back pain. Dark pee (urine) or blood in the pee. Widespread rash. Fast heartbeat. Feeling dizzy or light-headed. You may receive your blood transfusion in an outpatient setting. If so, youwill be told whom to contact to report any reactions. These symptoms may be an emergency. Do not wait to see if the symptoms will go away. Get medical help right away. Call your local emergency services (911 in the [place] Do not drive yourself to the hospital. Summary Bruising and soreness at the IV site are common. Check your insertion site every day for signs of infection. Rest as told by  your doctor. Go back to your normal activities as told by your doctor. Get help right away if you have signs of a serious reaction. This information is not intended to replace advice given to you by your health care provider. Make sure you discuss any questions you have with your healthcare provider. Document Revised: 11/30/2018 Document Reviewed: 11/30/2018 Elsevier Patient Education  [place].

## 2021-01-02 NOTE — Progress Notes (Signed)
1307: PT STABLE AT TIME OF DISCHARGE

## 2021-01-04 LAB — TYPE AND SCREEN
ABO/RH(D): A POS
Antibody Screen: NEGATIVE
Unit division: 0

## 2021-01-04 LAB — BPAM RBC
Blood Product Expiration Date: 202208092359
ISSUE DATE / TIME: 202207150731
Unit Type and Rh: 6200

## 2021-01-05 ENCOUNTER — Other Ambulatory Visit: Payer: Self-pay

## 2021-01-05 ENCOUNTER — Inpatient Hospital Stay: Payer: PPO

## 2021-01-05 ENCOUNTER — Encounter: Payer: Self-pay | Admitting: Oncology

## 2021-01-05 VITALS — BP 98/65 | HR 94 | Temp 98.6°F | Resp 18 | Wt 123.0 lb

## 2021-01-05 DIAGNOSIS — C50111 Malignant neoplasm of central portion of right female breast: Secondary | ICD-10-CM

## 2021-01-05 DIAGNOSIS — Z5111 Encounter for antineoplastic chemotherapy: Secondary | ICD-10-CM | POA: Diagnosis not present

## 2021-01-05 MED ORDER — PEGFILGRASTIM-JMDB 6 MG/0.6ML ~~LOC~~ SOSY
6.0000 mg | PREFILLED_SYRINGE | Freq: Once | SUBCUTANEOUS | Status: AC
  Administered 2021-01-05: 6 mg via SUBCUTANEOUS

## 2021-01-05 MED ORDER — PEGFILGRASTIM-JMDB 6 MG/0.6ML ~~LOC~~ SOSY
PREFILLED_SYRINGE | SUBCUTANEOUS | Status: AC
  Filled 2021-01-05: qty 0.6

## 2021-01-05 NOTE — Progress Notes (Signed)
1507: PT STABLE AT TIME OF DISCHARGE  

## 2021-01-05 NOTE — Patient Instructions (Signed)
Filgrastim, G-CSF injection What is this medication? FILGRASTIM, G-CSF (fil GRA stim) is a granulocyte colony-stimulating factor that stimulates the growth of neutrophils, a type of white blood cell (WBC) important in the body's fight against infection. It is used to reduce the incidence of fever and infection in patients with certain types of cancer who are receiving chemotherapy that affects the bone marrow, to stimulate blood cell production for removal of WBCs from the body prior to a bone marrow transplantation, to reduce the incidence of fever and infection in patients who have severe chronic neutropenia, and to improve survival outcomes following high-dose radiation exposure that is toxic to the bone marrow. This medicine may be used for other purposes; ask your health care provider or pharmacist if you have questions. COMMON BRAND NAME(S): Neupogen, Nivestym, Releuko, Zarxio What should I tell my care team before I take this medication? They need to know if you have any of these conditions: kidney disease latex allergy ongoing radiation therapy sickle cell disease an unusual or allergic reaction to filgrastim, pegfilgrastim, other medicines, foods, dyes, or preservatives pregnant or trying to get pregnant breast-feeding How should I use this medication? This medicine is for injection under the skin or infusion into a vein. As an infusion into a vein, it is usually given by a health care professional in a hospital or clinic setting. If you get this medicine at home, you will be taught how to prepare and give this medicine. Refer to the Instructions for Use that come with your medication packaging. Use exactly as directed. Take your medicine at regular intervals. Do not take your medicine more often than directed. It is important that you put your used needles and syringes in a special sharps container. Do not put them in a trash can. If you do not have a sharps container, call your pharmacist  or healthcare provider to get one. Talk to your pediatrician regarding the use of this medicine in children. While this drug may be prescribed for children as young as 7 months for selected conditions, precautions do apply. Overdosage: If you think you have taken too much of this medicine contact a poison control center or emergency room at once. NOTE: This medicine is only for you. Do not share this medicine with others. What if I miss a dose? It is important not to miss your dose. Call your doctor or health care professional if you miss a dose. What may interact with this medication? This medicine may interact with the following medications: medicines that may cause a release of neutrophils, such as lithium This list may not describe all possible interactions. Give your health care provider a list of all the medicines, herbs, non-prescription drugs, or dietary supplements you use. Also tell them if you smoke, drink alcohol, or use illegal drugs. Some items may interact with your medicine. What should I watch for while using this medication? Your condition will be monitored carefully while you are receiving this medicine. You may need blood work done while you are taking this medicine. Talk to your health care provider about your risk of cancer. You may be more at risk for certain types of cancer if you take this medicine. What side effects may I notice from receiving this medication? Side effects that you should report to your doctor or health care professional as soon as possible: allergic reactions like skin rash, itching or hives, swelling of the face, lips, or tongue back pain dizziness or feeling faint fever pain, redness, or   irritation at site where injected pinpoint red spots on the skin shortness of breath or breathing problems signs and symptoms of kidney injury like trouble passing urine, change in the amount of urine, or red or dark-brown urine stomach or side pain, or pain at  the shoulder swelling tiredness unusual bleeding or bruising Side effects that usually do not require medical attention (report to your doctor or health care professional if they continue or are bothersome): bone pain cough diarrhea hair loss headache muscle pain This list may not describe all possible side effects. Call your doctor for medical advice about side effects. You may report side effects to FDA at 1-800-FDA-1088. Where should I keep my medication? Keep out of the reach of children. Store in a refrigerator between 2 and 8 degrees C (36 and 46 degrees F). Do not freeze. Keep in carton to protect from light. Throw away this medicine if vials or syringes are left out of the refrigerator for more than 24 hours. Throw away any unused medicine after the expiration date. NOTE: This sheet is a summary. It may not cover all possible information. If you have questions about this medicine, talk to your doctor, pharmacist, or health care provider.  2022 Elsevier/Gold Standard (2019-06-28 18:47:55)  

## 2021-01-14 DIAGNOSIS — K56609 Unspecified intestinal obstruction, unspecified as to partial versus complete obstruction: Secondary | ICD-10-CM | POA: Diagnosis not present

## 2021-01-14 DIAGNOSIS — N2889 Other specified disorders of kidney and ureter: Secondary | ICD-10-CM | POA: Diagnosis not present

## 2021-01-14 DIAGNOSIS — C189 Malignant neoplasm of colon, unspecified: Secondary | ICD-10-CM | POA: Diagnosis not present

## 2021-01-14 DIAGNOSIS — J90 Pleural effusion, not elsewhere classified: Secondary | ICD-10-CM | POA: Diagnosis not present

## 2021-01-14 DIAGNOSIS — K6389 Other specified diseases of intestine: Secondary | ICD-10-CM | POA: Diagnosis not present

## 2021-01-14 DIAGNOSIS — J439 Emphysema, unspecified: Secondary | ICD-10-CM | POA: Diagnosis not present

## 2021-01-14 DIAGNOSIS — C50111 Malignant neoplasm of central portion of right female breast: Secondary | ICD-10-CM | POA: Diagnosis not present

## 2021-01-14 DIAGNOSIS — C50911 Malignant neoplasm of unspecified site of right female breast: Secondary | ICD-10-CM | POA: Diagnosis not present

## 2021-01-14 DIAGNOSIS — C773 Secondary and unspecified malignant neoplasm of axilla and upper limb lymph nodes: Secondary | ICD-10-CM | POA: Diagnosis not present

## 2021-01-14 LAB — HEPATIC FUNCTION PANEL
ALT: 17 (ref 7–35)
AST: 27 (ref 13–35)
Alkaline Phosphatase: 153 — AB (ref 25–125)
Bilirubin, Total: 0.3

## 2021-01-14 LAB — BASIC METABOLIC PANEL
BUN: 19 (ref 4–21)
CO2: 27 — AB (ref 13–22)
Chloride: 104 (ref 99–108)
Creatinine: 0.9 (ref 0.5–1.1)
Glucose: 94
Potassium: 3.7 (ref 3.4–5.3)
Sodium: 139 (ref 137–147)

## 2021-01-14 LAB — CBC AND DIFFERENTIAL
HCT: 25 — AB (ref 36–46)
Hemoglobin: 7.8 — AB (ref 12.0–16.0)
Neutrophils Absolute: 14.8
Platelets: 105 — AB (ref 150–399)
WBC: 20

## 2021-01-14 LAB — COMPREHENSIVE METABOLIC PANEL
Albumin: 3.6 (ref 3.5–5.0)
Calcium: 6.8 — AB (ref 8.7–10.7)

## 2021-01-14 LAB — CBC: RBC: 2.3 — AB (ref 3.87–5.11)

## 2021-01-15 ENCOUNTER — Encounter: Payer: Self-pay | Admitting: Oncology

## 2021-01-15 ENCOUNTER — Encounter: Payer: Self-pay | Admitting: Hematology and Oncology

## 2021-01-15 ENCOUNTER — Other Ambulatory Visit: Payer: Self-pay

## 2021-01-15 ENCOUNTER — Inpatient Hospital Stay: Payer: PPO | Admitting: Hematology and Oncology

## 2021-01-15 ENCOUNTER — Inpatient Hospital Stay: Payer: PPO

## 2021-01-15 VITALS — BP 123/64 | HR 78 | Temp 98.0°F | Resp 16 | Ht 65.5 in | Wt 128.9 lb

## 2021-01-15 VITALS — BP 116/61 | HR 84 | Temp 98.3°F | Resp 16 | Ht 65.5 in | Wt 128.1 lb

## 2021-01-15 DIAGNOSIS — Z170 Estrogen receptor positive status [ER+]: Secondary | ICD-10-CM | POA: Diagnosis not present

## 2021-01-15 DIAGNOSIS — Z5111 Encounter for antineoplastic chemotherapy: Secondary | ICD-10-CM | POA: Diagnosis not present

## 2021-01-15 DIAGNOSIS — C785 Secondary malignant neoplasm of large intestine and rectum: Secondary | ICD-10-CM | POA: Diagnosis not present

## 2021-01-15 DIAGNOSIS — C7889 Secondary malignant neoplasm of other digestive organs: Secondary | ICD-10-CM

## 2021-01-15 DIAGNOSIS — C50111 Malignant neoplasm of central portion of right female breast: Secondary | ICD-10-CM

## 2021-01-15 DIAGNOSIS — R197 Diarrhea, unspecified: Secondary | ICD-10-CM

## 2021-01-15 MED ORDER — EPOETIN ALFA-EPBX 10000 UNIT/ML IJ SOLN
10000.0000 [IU] | Freq: Once | INTRAMUSCULAR | Status: AC
  Administered 2021-01-15: 10000 [IU] via SUBCUTANEOUS

## 2021-01-15 MED ORDER — EPOETIN ALFA-EPBX 10000 UNIT/ML IJ SOLN
INTRAMUSCULAR | Status: AC
  Filled 2021-01-15: qty 2

## 2021-01-15 MED ORDER — SODIUM CHLORIDE 0.9 % IV SOLN
Freq: Once | INTRAVENOUS | Status: AC
  Filled 2021-01-15: qty 250

## 2021-01-15 MED ORDER — MAGNESIUM SULFATE 2 GM/50ML IV SOLN
2.0000 g | Freq: Once | INTRAVENOUS | Status: AC
  Administered 2021-01-15: 2 g via INTRAVENOUS

## 2021-01-15 MED ORDER — MAGNESIUM SULFATE 2 GM/50ML IV SOLN
INTRAVENOUS | Status: AC
  Filled 2021-01-15: qty 50

## 2021-01-15 MED ORDER — MAGNESIUM SULFATE 4 GM/100ML IV SOLN
4.0000 g | Freq: Once | INTRAVENOUS | Status: AC
  Administered 2021-01-15: 4 g via INTRAVENOUS

## 2021-01-15 MED ORDER — MAGNESIUM SULFATE 4 GM/100ML IV SOLN
INTRAVENOUS | Status: AC
  Filled 2021-01-15: qty 100

## 2021-01-15 NOTE — Patient Instructions (Signed)
Hypomagnesemia Hypomagnesemia is a condition in which the level of magnesium in the blood is low. Magnesium is a mineral that is found in many foods. It is used in many different processes in the body. Hypomagnesemia can affect every organ in thebody. In severe cases, it can cause life-threatening problems. What are the causes? This condition may be caused by: Not getting enough magnesium in your diet. Malnutrition. Problems with absorbing magnesium from the intestines. Dehydration. Alcohol abuse. Vomiting. Severe or chronic diarrhea. Some medicines, including medicines that make you urinate more (diuretics). Certain diseases, such as kidney disease, diabetes, celiac disease, and overactive thyroid. What are the signs or symptoms? Symptoms of this condition include: Loss of appetite. Nausea and vomiting. Involuntary shaking or trembling of a body part (tremor). Muscle weakness. Tingling in the arms and legs. Sudden tightening of muscles (muscle spasms). Confusion. Psychiatric issues, such as depression, irritability, or psychosis. A feeling of fluttering of the heart. Seizures. These symptoms are more severe if magnesium levels drop suddenly. How is this diagnosed? This condition may be diagnosed based on: Your symptoms and medical history. A physical exam. Blood and urine tests. How is this treated? Treatment depends on the cause and the severity of the condition. It may be treated with: A magnesium supplement. This can be taken in pill form. If the condition is severe, magnesium is usually given through an IV. Changes to your diet. You may be directed to eat foods that have a lot of magnesium, such as green leafy vegetables, peas, beans, and nuts. Stopping any intake of alcohol. Follow these instructions at home:     Make sure that your diet includes foods with magnesium. Foods that have a lot of magnesium in them include: Green leafy vegetables, such as spinach and  broccoli. Beans and peas. Nuts and seeds, such as almonds and sunflower seeds. Whole grains, such as whole grain bread and fortified cereals. Take magnesium supplements if your health care provider tells you to do that. Take them as directed. Take over-the-counter and prescription medicines only as told by your health care provider. Have your magnesium levels monitored as told by your health care provider. When you are active, drink fluids that contain electrolytes. Avoid drinking alcohol. Keep all follow-up visits as told by your health care provider. This is important. Contact a health care provider if: You get worse instead of better. Your symptoms return. Get help right away if you: Develop severe muscle weakness. Have trouble breathing. Feel that your heart is racing. Summary Hypomagnesemia is a condition in which the level of magnesium in the blood is low. Hypomagnesemia can affect every organ in the body. Treatment may include eating more foods that contain magnesium, taking magnesium supplements, and not drinking alcohol. Have your magnesium levels monitored as told by your health care provider. This information is not intended to replace advice given to you by your health care provider. Make sure you discuss any questions you have with your healthcare provider. Document Revised: 11/08/2019 Document Reviewed: 11/08/2019 Elsevier Patient Education  2022 Elsevier Inc.  

## 2021-01-15 NOTE — Progress Notes (Signed)
[facility]  [address] [place],  [place]  [address] [phone]  Clinic Day:  01/15/2021  Referring physician: Greig Right, MD   CHIEF COMPLAINT:  CC:  Metastatic hormone receptor positive breast cancer  Current Treatment:   Carboplatin/gemcitabine day 1 and day 8 every 21 days   HISTORY OF PRESENT ILLNESS:  Barbara Mayo is a 60 y.o. female with a history of  metastatic hormone receptor positive breast cancer, which includes spread of disease to her stomach.   She is currently receiving palliative chemotherapy with carboplatin/gemcitabine and has tolerated this well. Carboplatin is being given on day 1; gemcitabine is being given on days 1 and 8.  She received Retacrit on July 8th for her anemia. She then received 1 unit of packed red blood cells on July 15th when her hemoglobin was down to 7.6.   INTERVAL HISTORY:  Barbara Mayo is here today for repeat clinical assessment prior to a 5th cycle of carboplatin/gemcitabine.  She reports fatigue, but denies dyspnea, lightheadedness or chest pain.  She has had some chronic mild lower extremity edema. She denies fevers or chills. She denies pain. Her appetite is good. Her weight has increased 4 pounds over last 3 weeks .  She states she continues calcium supplement 2 tablets daily and magnesium supplement every other day. Prior to her visit today, she underwent repeat CT imaging to reassess her disease baseline prior to further chemotherapy .  REVIEW OF SYSTEMS:  Review of Systems  Constitutional:  Positive for fatigue. Negative for appetite change, chills, fever and unexpected weight change.  HENT:   Negative for lump/mass, mouth sores and sore throat.   Respiratory:  Negative for cough and shortness of breath.   Cardiovascular:  Positive for leg swelling. Negative for chest pain.  Gastrointestinal:  Negative for abdominal pain, constipation, diarrhea, nausea and vomiting.  Endocrine: Negative for hot  flashes.  Genitourinary:  Negative for difficulty urinating, dysuria, frequency and hematuria.   Musculoskeletal:  Negative for arthralgias, back pain and myalgias.  Skin:  Negative for rash.  Neurological:  Negative for dizziness and headaches.  Hematological:  Negative for adenopathy. Does not bruise/bleed easily.  Psychiatric/Behavioral:  Negative for depression and sleep disturbance. The patient is not nervous/anxious.     VITALS:  Blood pressure 123/64, pulse 78, temperature 98 F (36.7 C), resp. rate 16, height 5' 5.5" (1.664 m), weight 128 lb 14.4 oz (58.5 kg), SpO2 98 %.  Wt Readings from Last 3 Encounters:  01/15/21 128 lb 14.4 oz (58.5 kg)  01/05/21 123 lb (55.8 kg)  01/02/21 125 lb (56.7 kg)    Body mass index is 21.12 kg/m.  Performance status (ECOG): 0 - Asymptomatic  PHYSICAL EXAM:  Physical Exam Vitals and nursing note reviewed.  Constitutional:      General: She is not in acute distress.    Appearance: Normal appearance.  HENT:     Head: Normocephalic and atraumatic.     Mouth/Throat:     Mouth: Mucous membranes are moist.     Pharynx: Oropharynx is clear. No oropharyngeal exudate or posterior oropharyngeal erythema.  Eyes:     General: No scleral icterus.    Extraocular Movements: Extraocular movements intact.     Conjunctiva/sclera: Conjunctivae normal.     Pupils: Pupils are equal, round, and reactive to light.  Cardiovascular:     Rate and Rhythm: Normal rate and regular rhythm.     Heart sounds: Normal heart sounds. No murmur heard.  No friction rub. No gallop.  Pulmonary:     Effort: Pulmonary effort is normal.     Breath sounds: Normal breath sounds. No wheezing, rhonchi or rales.  Chest:  Breasts:    Right: No axillary adenopathy or supraclavicular adenopathy.     Left: No axillary adenopathy or supraclavicular adenopathy.     Comments: Breast exam is deferred Abdominal:     General: There is no distension.     Palpations: Abdomen is soft.  There is no hepatomegaly, splenomegaly or mass.     Tenderness: There is no abdominal tenderness.  Musculoskeletal:        General: Normal range of motion.     Cervical back: Normal range of motion and neck supple. No tenderness.     Right lower leg: Edema (trace) present.     Left lower leg: Edema (trace) present.  Lymphadenopathy:     Cervical: No cervical adenopathy.     Upper Body:     Right upper body: No supraclavicular or axillary adenopathy.     Left upper body: No supraclavicular or axillary adenopathy.     Lower Body: No right inguinal adenopathy. No left inguinal adenopathy.  Skin:    General: Skin is warm and dry.     Coloration: Skin is not jaundiced.     Findings: No rash.  Neurological:     Mental Status: She is alert and oriented to person, place, and time.     Cranial Nerves: No cranial nerve deficit.  Psychiatric:        Mood and Affect: Mood normal.        Behavior: Behavior normal.        Thought Content: Thought content normal.    LABS:   CBC Latest Ref Rng & Units 01/14/2021 01/01/2021 12/25/2020  WBC - 20.0 4.4 19.1  Hemoglobin 12.0 - 16.0 7.8(A) 7.6(A) 8.0(A)  Hematocrit 36 - 46 25(A) 23(A) 25(A)  Platelets 150 - 399 105(A) 304 159   CMP Latest Ref Rng & Units 01/14/2021 01/01/2021 12/25/2020  BUN 4 - '21 19 18 13  '$ Creatinine 0.5 - 1.1 0.9 0.8 0.8  Sodium 137 - 147 139 140 140  Potassium 3.4 - 5.3 3.7 3.8 4.0  Chloride 99 - 108 104 106 110(A)  CO2 13 - 22 27(A) 21 21  Calcium 8.7 - 10.7 6.8(A) 8.0(A) 7.5(A)  Total Protein 6.3 - 8.2 g/dL - - -  Alkaline Phos 25 - 125 153(A) 103 132(A)  AST 13 - 35 27 45(A) 30  ALT 7 - 35 17 35 19     No results found for: CEA1 / No results found for: CEA1 No results found for: PSA1 No results found for: [id number] No results found for: CAN125  No results found for: TOTALPROTELP, ALBUMINELP, A1GS, A2GS, BETS, BETA2SER, GAMS, MSPIKE, SPEI Lab Results  Component Value Date   TIBC 328 12/25/2020   TIBC 272 05/29/2020    FERRITIN 272 12/25/2020   FERRITIN 214 05/29/2020   IRONPCTSAT 20 12/25/2020   IRONPCTSAT 16.9 05/29/2020   No results found for: LDH  STUDIES:  No results found.  Exam(s): [id number] CT/CT CHEST-ABD-PELV W/IV CM CLINICAL DATA:  Breast cancer diagnosed in 2016. Colon cancer diagnosed in 2018. Right axillary metastasis. Appendectomy. Cholecystectomy. Right mastectomy. Colon resection.  EXAM: CT CHEST, ABDOMEN, AND PELVIS WITH CONTRAST  TECHNIQUE: Multidetector CT imaging of the chest, abdomen and pelvis was performed following the standard protocol during bolus administration of intravenous contrast.  CONTRAST:  100  cc of Isovue 370  COMPARISON:  10/20/2020  FINDINGS: CT CHEST FINDINGS  Cardiovascular: Left Port-A-Cath tip mid right atrium. Aortic atherosclerosis. Aberrant right subclavian artery traversing posterior to the esophagus. Borderline cardiomegaly, with decrease in tiny pericardial effusion. No central pulmonary embolism, on this non-dedicated study.  Mediastinum/Nodes: No supraclavicular adenopathy. Right axillary node dissection. No axillary adenopathy. No mediastinal or hilar adenopathy.  Lungs/Pleura: Resolution of bilateral pleural effusions.  Minimal anterior right apical presumably radiation induced fibrosis.  3 mm right lower lobe pulmonary nodule on 90/4 was present on 08/22/2018 and can be presumed benign.  Musculoskeletal: Right mastectomy.  No acute osseous abnormality.  CT ABDOMEN PELVIS FINDINGS  Hepatobiliary: Normal liver. Cholecystectomy, without biliary ductal dilatation.  Pancreas: Normal, without mass or ductal dilatation.  Spleen: Normal in size, without focal abnormality.  Adrenals/Urinary Tract: Normal adrenal glands. Mild right-sided caliectasis is unchanged. Mild renal cortical thinning bilaterally. A small evolving infarct within the lateral interpolar left kidney. Normal urinary bladder.  Stomach/Bowel: Normal stomach,  without wall thickening. Right hemicolectomy. The previously described small bowel dilatation has improved. There are residual areas of mild mid small bowel dilatation including at up to the 3.6 cm on 87/2. No focal transition point identified.  Vascular/Lymphatic: Aortic atherosclerosis. No abdominopelvic adenopathy.  Reproductive: Normal uterus and adnexa.  Other: Near complete resolution of abdominopelvic fluid with trace fluid and subtle peritoneal thickening remaining, including on 104/2. No solid omental or peritoneal nodules identified.  Musculoskeletal: Mild osteopenia.  Advanced lumbosacral spondylosis.  IMPRESSION: 1. Improved appearance of the abdomen and pelvis since 10/20/2020. Near complete resolution of abdominopelvic ascites with improvement to resolution in small-bowel obstruction. There are residual mildly dilated small bowel loops, without focal transition point identified. 2. Residual subtle peritoneal thickening is nonspecific. Peritoneal metastasis cannot be excluded. 3.  No acute process or evidence of metastatic disease in the chest. 4. Aortic atherosclerosis (ICD10-I70.0) and emphysema (ICD10-J43.9). 5. Chronic mild right-sided caliectasis. 6. Resolution of bilateral pleural effusions.   HISTORY:   Past Medical History:  Diagnosis Date   Breast cancer ([place])    spread to her colon   Chronic diarrhea    per pt due to 5 ft of colon being removed   GERD (gastroesophageal reflux disease)     Past Surgical History:  Procedure Laterality Date   APPENDECTOMY     CHOLECYSTECTOMY     COLON SURGERY     removed 5 feet due to cancer that spread from breast   lymph nodes     14 removed due to her breast cancer   MASTECTOMY Right    TONSILLECTOMY     as a child   TUBAL LIGATION     WRIST SURGERY      Family History  Problem Relation Age of Onset   Breast cancer Mother    Diabetes Mother    Osteoporosis Mother    Diabetes Sister    Cystic  fibrosis Nephew    Diabetes Niece    Diabetes Grandson    Diabetes Grandson    Heart disease Father    Esophageal cancer Neg Hx    Rectal cancer Neg Hx    Colon cancer Neg Hx    Stomach cancer Neg Hx    Liver cancer Neg Hx     Social History:  reports that she has quit smoking. Her smoking use included cigarettes. She has never used smokeless tobacco. She reports that she does not drink alcohol and does not use drugs.The patient is accompanied by  her mother and father in-law today.  Allergies: No Known Allergies  Current Medications: Current Outpatient Medications  Medication Sig Dispense Refill   budesonide (ENTOCORT EC) 3 MG 24 hr capsule Take by mouth daily. (Patient not taking: Reported on 08/26/2020)     Calcium Carbonate (CALCIUM 500 PO) Take by mouth.     calcium carbonate (OS-CAL) 1250 (500 Ca) MG chewable tablet Chew by mouth.     colestipol (COLESTID) 1 g tablet TAKE 2 TABLETS(2 GRAMS) BY MOUTH TWICE DAILY (Patient not taking: Reported on 08/26/2020) 360 tablet 3   diphenoxylate-atropine (LOMOTIL) 2.5-0.025 MG tablet TAKE 1 TABLET BY MOUTH FOUR TIMES DAILY AS NEEDED FOR DIARRHEA OR LOOSE STOOLS 30 tablet 1   everolimus (AFINITOR) 10 MG tablet Take 10 mg by mouth daily.     exemestane (AROMASIN) 25 MG tablet Take 25 mg by mouth daily after breakfast. (Patient not taking: Reported on 08/26/2020)     Fulvestrant (FASLODEX IM) Inject into the muscle.     furosemide (LASIX) 20 MG tablet TAKE 1 TABLET(20 MG) BY MOUTH 1 TIME FOR UP TO 1 DOSE AS NEEDED FOR SWELLING 90 tablet 0   hydrocortisone cream 0.5 % Apply 1 application topically 2 (two) times daily. 30 g 0   magnesium oxide (MAG-OX) 400 (241.3 Mg) MG tablet Take 1 tablet (400 mg total) by mouth daily. 30 tablet 2   magnesium oxide (MAG-OX) 400 MG tablet      megestrol (MEGACE ES) 625 MG/5ML suspension SHAKE LIQUID AND TAKE 5 ML(625 MG) BY MOUTH DAILY (Patient not taking: Reported on 08/26/2020) 450 mL 0   octreotide (SANDOSTATIN LAR)  30 MG injection Inject 30 mg into the muscle every 28 (twenty-eight) days.     OLANZapine (ZYPREXA) 10 MG tablet TAKE 1 TABLET(10 MG) BY MOUTH AT BEDTIME 30 tablet 2   omeprazole (PRILOSEC) 40 MG capsule Take 40 mg by mouth daily.     omeprazole (PRILOSEC) 40 MG capsule Take by mouth.     ondansetron (ZOFRAN-ODT) 8 MG disintegrating tablet Take 8 mg by mouth every 6 (six) hours as needed.     ondansetron (ZOFRAN-ODT) 8 MG disintegrating tablet DISSOLVE 1 TABLET(8 MG) ON THE TONGUE EVERY 8 HOURS AS NEEDED FOR NAUSEA OR VOMITING 20 tablet 0   Oxycodone HCl 10 MG TABS Take 1 tablet (10 mg total) by mouth every 4 (four) hours as needed. 90 tablet 0   potassium chloride (KLOR-CON) 10 MEQ tablet TAKE 2 TABLETS BY MOUTH TWICE DAILY 120 tablet 2   prochlorperazine (COMPAZINE) 10 MG tablet Take 1 tablet (10 mg total) by mouth every 6 (six) hours as needed for nausea or vomiting. 90 tablet 3   No current facility-administered medications for this visit.   Facility-Administered Medications Ordered in Other Visits  Medication Dose Route Frequency Provider Last Rate Last Admin   0.9 %  sodium chloride infusion (Manually program via Guardrails IV Fluids)  250 mL Intravenous Once Lewis, Dequincy A, MD       heparin lock flush 100 unit/mL  500 Units Intracatheter Daily PRN Lewis, Dequincy A, MD       heparin lock flush 100 unit/mL  250 Units Intracatheter PRN Lewis, Dequincy A, MD       sodium chloride flush (NS) 0.9 % injection 10 mL  10 mL Intracatheter PRN Lewis, Dequincy A, MD       sodium chloride flush (NS) 0.9 % injection 3 mL  3 mL Intracatheter PRN Marice Potter, MD  ASSESSMENT & PLAN:   Assessment/Plan:  Barbara Mayo is a 60 y.o. female with metastatic hormone positive breast cancer.  She has had a good response to 4 cycles of palliative carboplatin/gemcitabine based on repeat CT imaging.  She will proceed with her 5th cycle of carboplatin/gemcitabine on August 1st.  She has severe  hypomagnesemia with a magnesium of 0.7, likely due to chemotherapy. I will arrange for her to have IV magnesium as an outpatient today. I will have her increase her oral magnesium to twice daily.  The hypocalcemia is likely due to the hypomagnesemia, so I will not add additional calcium supplementation. She has persistent anemia despite 1 dose of Retacrit.  I will plan to give her Retacrit weekly with her chemotherapy this cycle.  She is not very symptomatic from her anemia, so we will hold off on transfusion.   She will have repeat labs again next week. She knows to contact us if she has symptoms of worsening anemia. I will see her back in 3 weeks before she heads into her 6th cycle of carboplatin/gemcitabine. The patient understands the plans discussed today and is in agreement with them.  She knows to contact our office if she develops concerns prior to her next appointment.    Marvia Pickles, PA-C

## 2021-01-16 ENCOUNTER — Encounter: Payer: Self-pay | Admitting: Oncology

## 2021-01-16 NOTE — Progress Notes (Signed)
Ok to proceed with chemo despite hemoglobin=7.8 per [facility].  Pt is not symptomatic at this time.  Pt will receive retacrit injections weekly x 2.

## 2021-01-19 ENCOUNTER — Other Ambulatory Visit: Payer: Self-pay | Admitting: Pharmacist

## 2021-01-19 ENCOUNTER — Other Ambulatory Visit: Payer: Self-pay

## 2021-01-19 ENCOUNTER — Inpatient Hospital Stay: Payer: PPO | Attending: Oncology

## 2021-01-19 ENCOUNTER — Encounter: Payer: Self-pay | Admitting: Oncology

## 2021-01-19 VITALS — BP 120/58 | HR 92 | Temp 98.5°F | Resp 18 | Ht 65.5 in | Wt 129.0 lb

## 2021-01-19 DIAGNOSIS — C50919 Malignant neoplasm of unspecified site of unspecified female breast: Secondary | ICD-10-CM | POA: Insufficient documentation

## 2021-01-19 DIAGNOSIS — Z5111 Encounter for antineoplastic chemotherapy: Secondary | ICD-10-CM | POA: Insufficient documentation

## 2021-01-19 DIAGNOSIS — Z5189 Encounter for other specified aftercare: Secondary | ICD-10-CM | POA: Diagnosis not present

## 2021-01-19 DIAGNOSIS — Z170 Estrogen receptor positive status [ER+]: Secondary | ICD-10-CM

## 2021-01-19 DIAGNOSIS — C7889 Secondary malignant neoplasm of other digestive organs: Secondary | ICD-10-CM | POA: Insufficient documentation

## 2021-01-19 DIAGNOSIS — D649 Anemia, unspecified: Secondary | ICD-10-CM | POA: Insufficient documentation

## 2021-01-19 DIAGNOSIS — C50111 Malignant neoplasm of central portion of right female breast: Secondary | ICD-10-CM

## 2021-01-19 MED ORDER — SODIUM CHLORIDE 0.9 % IV SOLN
Freq: Once | INTRAVENOUS | Status: AC
  Filled 2021-01-19: qty 250

## 2021-01-19 MED ORDER — SODIUM CHLORIDE 0.9 % IV SOLN
10.0000 mg | Freq: Once | INTRAVENOUS | Status: AC
  Administered 2021-01-19: 10 mg via INTRAVENOUS
  Filled 2021-01-19: qty 10

## 2021-01-19 MED ORDER — SODIUM CHLORIDE 0.9 % IV SOLN
430.0000 mg | Freq: Once | INTRAVENOUS | Status: AC
  Administered 2021-01-19: 430 mg via INTRAVENOUS
  Filled 2021-01-19: qty 43

## 2021-01-19 MED ORDER — SODIUM CHLORIDE 0.9 % IV SOLN
150.0000 mg | Freq: Once | INTRAVENOUS | Status: AC
  Administered 2021-01-19: 150 mg via INTRAVENOUS
  Filled 2021-01-19: qty 150

## 2021-01-19 MED ORDER — PALONOSETRON HCL INJECTION 0.25 MG/5ML
INTRAVENOUS | Status: AC
  Filled 2021-01-19: qty 5

## 2021-01-19 MED ORDER — HEPARIN SOD (PORK) LOCK FLUSH 100 UNIT/ML IV SOLN
500.0000 [IU] | Freq: Once | INTRAVENOUS | Status: AC | PRN
  Administered 2021-01-19: 500 [IU]
  Filled 2021-01-19: qty 5

## 2021-01-19 MED ORDER — PALONOSETRON HCL INJECTION 0.25 MG/5ML
0.2500 mg | Freq: Once | INTRAVENOUS | Status: AC
  Administered 2021-01-19: 0.25 mg via INTRAVENOUS

## 2021-01-19 MED ORDER — SODIUM CHLORIDE 0.9 % IV SOLN
800.0000 mg/m2 | Freq: Once | INTRAVENOUS | Status: AC
  Administered 2021-01-19: 1330 mg via INTRAVENOUS
  Filled 2021-01-19: qty 26.3

## 2021-01-19 MED ORDER — SODIUM CHLORIDE 0.9% FLUSH
10.0000 mL | INTRAVENOUS | Status: DC | PRN
  Administered 2021-01-19: 10 mL
  Filled 2021-01-19: qty 10

## 2021-01-19 NOTE — Patient Instructions (Signed)
[place]  Discharge Instructions: Thank you for choosing [facility] to provide your oncology and hematology care.  If you have a lab appointment with the [facility], please go directly to the [facility] and check in at the registration area.   Wear comfortable clothing and clothing appropriate for easy access to any Portacath or PICC line.   We strive to give you quality time with your provider. You may need to reschedule your appointment if you arrive late (15 or more minutes).  Arriving late affects you and other patients whose appointments are after yours.  Also, if you miss three or more appointments without notifying the office, you may be dismissed from the clinic at the provider's discretion.      For prescription refill requests, have your pharmacy contact our office and allow 72 hours for refills to be completed.    Today you received the following chemotherapy and/or immunotherapy agents gemzar/carboplatin   To help prevent nausea and vomiting after your treatment, we encourage you to take your nausea medication as directed.  BELOW ARE SYMPTOMS THAT SHOULD BE REPORTED IMMEDIATELY: *FEVER GREATER THAN 100.4 F (38 C) OR HIGHER *CHILLS OR SWEATING *NAUSEA AND VOMITING THAT IS NOT CONTROLLED WITH YOUR NAUSEA MEDICATION *UNUSUAL SHORTNESS OF BREATH *UNUSUAL BRUISING OR BLEEDING *URINARY PROBLEMS (pain or burning when urinating, or frequent urination) *BOWEL PROBLEMS (unusual diarrhea, constipation, pain near the anus) TENDERNESS IN MOUTH AND THROAT WITH OR WITHOUT PRESENCE OF ULCERS (sore throat, sores in mouth, or a toothache) UNUSUAL RASH, SWELLING OR PAIN  UNUSUAL VAGINAL DISCHARGE OR ITCHING   Items with * indicate a potential emergency and should be followed up as soon as possible or go to the Emergency Department if any problems should occur.  Please show the CHEMOTHERAPY ALERT CARD or IMMUNOTHERAPY ALERT CARD at check-in to  the Emergency Department and triage nurse.  Should you have questions after your visit or need to cancel or reschedule your appointment, please contact [facility]  Dept: [phone]  and follow the prompts.  Office hours are 8:00 a.m. to 4:30 p.m. Monday - Friday. Please note that voicemails left after 4:00 p.m. may not be returned until the following business day.  We are closed weekends and major holidays. You have access to a nurse at all times for urgent questions. Please call the main number to the clinic Dept: [phone] and follow the prompts.  For any non-urgent questions, you may also contact your provider using MyChart. We now offer e-Visits for anyone 36 and older to request care online for non-urgent symptoms. For details visit [email or web address].   Also download the MyChart app! Go to the app store, search "MyChart", open the app, select [facility], and log in with your MyChart username and password.  Due to Covid, a mask is required upon entering the hospital/clinic. If you do not have a mask, one will be given to you upon arrival. For doctor visits, patients may have 1 support person aged 31 or older with them. For treatment visits, patients cannot have anyone with them due to current Covid guidelines and our immunocompromised population.   Carboplatin injection What is this medication? CARBOPLATIN (KAR boe pla tin) is a chemotherapy drug. It targets fast dividing cells, like cancer cells, and causes these cells to die. This medicine is usedto treat ovarian cancer and many other cancers. This medicine may be used for other purposes; ask your health care provider orpharmacist  if you have questions. COMMON BRAND NAME(S): Paraplatin What should I tell my care team before I take this medication? They need to know if you have any of these conditions: blood disorders hearing problems kidney disease recent or ongoing radiation therapy an unusual or  allergic reaction to carboplatin, cisplatin, other chemotherapy, other medicines, foods, dyes, or preservatives pregnant or trying to get pregnant breast-feeding How should I use this medication? This drug is usually given as an infusion into a vein. It is administered in [place] or clinic by a specially trained health care professional. Talk to your pediatrician regarding the use of this medicine in children.Special care may be needed. Overdosage: If you think you have taken too much of this medicine contact apoison control center or emergency room at once. NOTE: This medicine is only for you. Do not share this medicine with others. What if I miss a dose? It is important not to miss a dose. Call your doctor or health careprofessional if you are unable to keep an appointment. What may interact with this medication? medicines for seizures medicines to increase blood counts like filgrastim, pegfilgrastim, sargramostim some antibiotics like amikacin, gentamicin, neomycin, streptomycin, tobramycin vaccines Talk to your doctor or health care professional before taking any of thesemedicines: acetaminophen aspirin ibuprofen ketoprofen naproxen This list may not describe all possible interactions. Give your health care provider a list of all the medicines, herbs, non-prescription drugs, or dietary supplements you use. Also tell them if you smoke, drink alcohol, or use illegaldrugs. Some items may interact with your medicine. What should I watch for while using this medication? Your condition will be monitored carefully while you are receiving this medicine. You will need important blood work done while you are taking thismedicine. This drug may make you feel generally unwell. This is not uncommon, as chemotherapy can affect healthy cells as well as cancer cells. Report any side effects. Continue your course of treatment even though you feel ill unless yourdoctor tells you to stop. In some cases,  you may be given additional medicines to help with side effects.Follow all directions for their use. Call your doctor or health care professional for advice if you get a fever, chills or sore throat, or other symptoms of a cold or flu. Do not treat yourself. This drug decreases your body's ability to fight infections. Try toavoid being around people who are sick. This medicine may increase your risk to bruise or bleed. Call your doctor orhealth care professional if you notice any unusual bleeding. Be careful brushing and flossing your teeth or using a toothpick because you may get an infection or bleed more easily. If you have any dental work done,tell your dentist you are receiving this medicine. Avoid taking products that contain aspirin, acetaminophen, ibuprofen, naproxen, or ketoprofen unless instructed by your doctor. These medicines may hide afever. Do not become pregnant while taking this medicine. Women should inform their doctor if they wish to become pregnant or think they might be pregnant. There is a potential for serious side effects to an unborn child. Talk to your health care professional or pharmacist for more information. Do not breast-feed aninfant while taking this medicine. What side effects may I notice from receiving this medication? Side effects that you should report to your doctor or health care professionalas soon as possible: allergic reactions like skin rash, itching or hives, swelling of the face, lips, or tongue signs of infection - fever or chills, cough, sore throat, pain or difficulty passing urine signs  of decreased platelets or bleeding - bruising, pinpoint red spots on the skin, black, tarry stools, nosebleeds signs of decreased red blood cells - unusually weak or tired, fainting spells, lightheadedness breathing problems changes in hearing changes in vision chest pain high blood pressure low blood counts - This drug may decrease the number of white blood cells,  red blood cells and platelets. You may be at increased risk for infections and bleeding. nausea and vomiting pain, swelling, redness or irritation at the injection site pain, tingling, numbness in the hands or feet problems with balance, talking, walking trouble passing urine or change in the amount of urine Side effects that usually do not require medical attention (report to yourdoctor or health care professional if they continue or are bothersome): hair loss loss of appetite metallic taste in the mouth or changes in taste This list may not describe all possible side effects. Call your doctor for medical advice about side effects. You may report side effects to FDA at1-800-FDA-1088. Where should I keep my medication? This drug is given in a hospital or clinic and will not be stored at home. NOTE: This sheet is a summary. It may not cover all possible information. If you have questions about this medicine, talk to your doctor, pharmacist, orhealth care provider.  2022 Elsevier/Gold Standard (2007-09-12 14:38:05) Gemcitabine injection What is this medication? GEMCITABINE (jem SYE ta been) is a chemotherapy drug. This medicine is used to treat many types of cancer like breast cancer, lung cancer, pancreatic cancer,and ovarian cancer. This medicine may be used for other purposes; ask your health care provider orpharmacist if you have questions. COMMON BRAND NAME(S): Gemzar, Infugem What should I tell my care team before I take this medication? They need to know if you have any of these conditions: blood disorders infection kidney disease liver disease lung or breathing disease, like asthma recent or ongoing radiation therapy an unusual or allergic reaction to gemcitabine, other chemotherapy, other medicines, foods, dyes, or preservatives pregnant or trying to get pregnant breast-feeding How should I use this medication? This drug is given as an infusion into a vein. It is administered  in a hospitalor clinic by a specially trained health care professional. Talk to your pediatrician regarding the use of this medicine in children.Special care may be needed. Overdosage: If you think you have taken too much of this medicine contact apoison control center or emergency room at once. NOTE: This medicine is only for you. Do not share this medicine with others. What if I miss a dose? It is important not to miss your dose. Call your doctor or health careprofessional if you are unable to keep an appointment. What may interact with this medication? medicines to increase blood counts like filgrastim, pegfilgrastim, sargramostim some other chemotherapy drugs like cisplatin vaccines Talk to your doctor or health care professional before taking any of thesemedicines: acetaminophen aspirin ibuprofen ketoprofen naproxen This list may not describe all possible interactions. Give your health care provider a list of all the medicines, herbs, non-prescription drugs, or dietary supplements you use. Also tell them if you smoke, drink alcohol, or use illegaldrugs. Some items may interact with your medicine. What should I watch for while using this medication? Visit your doctor for checks on your progress. This drug may make you feel generally unwell. This is not uncommon, as chemotherapy can affect healthy cells as well as cancer cells. Report any side effects. Continue your course oftreatment even though you feel ill unless your doctor tells you  to stop. In some cases, you may be given additional medicines to help with side effects.Follow all directions for their use. Call your doctor or health care professional for advice if you get a fever, chills or sore throat, or other symptoms of a cold or flu. Do not treat yourself. This drug decreases your body's ability to fight infections. Try toavoid being around people who are sick. This medicine may increase your risk to bruise or bleed. Call your doctor  orhealth care professional if you notice any unusual bleeding. Be careful brushing and flossing your teeth or using a toothpick because you may get an infection or bleed more easily. If you have any dental work done,tell your dentist you are receiving this medicine. Avoid taking products that contain aspirin, acetaminophen, ibuprofen, naproxen, or ketoprofen unless instructed by your doctor. These medicines may hide afever. Do not become pregnant while taking this medicine or for 6 months after stopping it. Women should inform their doctor if they wish to become pregnant or think they might be pregnant. Men should not father a child while taking this medicine and for 3 months after stopping it. There is a potential for serious side effects to an unborn child. Talk to your health care professional or pharmacist for more information. Do not breast-feed an infant while takingthis medicine or for at least 1 week after stopping it. Men should inform their doctors if they wish to father a child. This medicine may lower sperm counts. Talk with your doctor or health care professional ifyou are concerned about your fertility. What side effects may I notice from receiving this medication? Side effects that you should report to your doctor or health care professionalas soon as possible: allergic reactions like skin rash, itching or hives, swelling of the face, lips, or tongue breathing problems pain, redness, or irritation at site where injected signs and symptoms of a dangerous change in heartbeat or heart rhythm like chest pain; dizziness; fast or irregular heartbeat; palpitations; feeling faint or lightheaded, falls; breathing problems signs of decreased platelets or bleeding - bruising, pinpoint red spots on the skin, black, tarry stools, blood in the urine signs of decreased red blood cells - unusually weak or tired, feeling faint or lightheaded, falls signs of infection - fever or chills, cough, sore throat,  pain or difficulty passing urine signs and symptoms of kidney injury like trouble passing urine or change in the amount of urine signs and symptoms of liver injury like dark yellow or brown urine; general ill feeling or flu-like symptoms; light-colored stools; loss of appetite; nausea; right upper belly pain; unusually weak or tired; yellowing of the eyes or skin swelling of ankles, feet, hands Side effects that usually do not require medical attention (report to yourdoctor or health care professional if they continue or are bothersome): constipation diarrhea hair loss loss of appetite nausea rash vomiting This list may not describe all possible side effects. Call your doctor for medical advice about side effects. You may report side effects to FDA at1-800-FDA-1088. Where should I keep my medication? This drug is given in a hospital or clinic and will not be stored at home. NOTE: This sheet is a summary. It may not cover all possible information. If you have questions about this medicine, talk to your doctor, pharmacist, orhealth care provider.  2022 Elsevier/Gold Standard (2017-08-31 18:06:11)

## 2021-01-20 ENCOUNTER — Encounter: Payer: Self-pay | Admitting: Oncology

## 2021-01-23 ENCOUNTER — Inpatient Hospital Stay: Payer: PPO

## 2021-01-23 ENCOUNTER — Other Ambulatory Visit: Payer: Self-pay

## 2021-01-23 ENCOUNTER — Other Ambulatory Visit: Payer: Self-pay | Admitting: Hematology and Oncology

## 2021-01-23 DIAGNOSIS — Z170 Estrogen receptor positive status [ER+]: Secondary | ICD-10-CM

## 2021-01-23 DIAGNOSIS — C785 Secondary malignant neoplasm of large intestine and rectum: Secondary | ICD-10-CM

## 2021-01-23 DIAGNOSIS — C7889 Secondary malignant neoplasm of other digestive organs: Secondary | ICD-10-CM

## 2021-01-23 DIAGNOSIS — D649 Anemia, unspecified: Secondary | ICD-10-CM | POA: Diagnosis not present

## 2021-01-23 DIAGNOSIS — C50111 Malignant neoplasm of central portion of right female breast: Secondary | ICD-10-CM

## 2021-01-23 LAB — HEPATIC FUNCTION PANEL
ALT: 25 (ref 7–35)
AST: 34 (ref 13–35)
Alkaline Phosphatase: 107 (ref 25–125)
Bilirubin, Total: 0.5

## 2021-01-23 LAB — CBC AND DIFFERENTIAL
HCT: 30 — AB (ref 36–46)
Hemoglobin: 9.7 — AB (ref 12.0–16.0)
Neutrophils Absolute: 6.19
Platelets: 447 — AB (ref 150–399)
WBC: 7.2

## 2021-01-23 LAB — CBC
MCV: 112 — AB (ref 81–99)
RBC: 2.64 — AB (ref 3.87–5.11)

## 2021-01-23 LAB — BASIC METABOLIC PANEL
BUN: 20 (ref 4–21)
CO2: 24 — AB (ref 13–22)
Chloride: 104 (ref 99–108)
Creatinine: 0.8 (ref 0.5–1.1)
Glucose: 142
Potassium: 4.3 (ref 3.4–5.3)
Sodium: 137 (ref 137–147)

## 2021-01-23 LAB — COMPREHENSIVE METABOLIC PANEL
Albumin: 4.1 (ref 3.5–5.0)
Calcium: 8.6 — AB (ref 8.7–10.7)

## 2021-01-23 LAB — MAGNESIUM: Magnesium: 1.4 — AB (ref 1.6–2.3)

## 2021-01-26 ENCOUNTER — Other Ambulatory Visit: Payer: Self-pay

## 2021-01-26 ENCOUNTER — Inpatient Hospital Stay: Payer: PPO

## 2021-01-26 ENCOUNTER — Encounter: Payer: Self-pay | Admitting: Oncology

## 2021-01-26 VITALS — BP 141/63 | HR 89 | Temp 98.7°F | Resp 18 | Ht 65.5 in | Wt 122.0 lb

## 2021-01-26 DIAGNOSIS — Z5111 Encounter for antineoplastic chemotherapy: Secondary | ICD-10-CM | POA: Diagnosis not present

## 2021-01-26 DIAGNOSIS — R197 Diarrhea, unspecified: Secondary | ICD-10-CM

## 2021-01-26 DIAGNOSIS — C50111 Malignant neoplasm of central portion of right female breast: Secondary | ICD-10-CM

## 2021-01-26 DIAGNOSIS — Z170 Estrogen receptor positive status [ER+]: Secondary | ICD-10-CM

## 2021-01-26 MED ORDER — OCTREOTIDE ACETATE 30 MG IM KIT
30.0000 mg | PACK | Freq: Once | INTRAMUSCULAR | Status: AC
  Administered 2021-01-26: 30 mg via INTRAMUSCULAR

## 2021-01-26 MED ORDER — ONDANSETRON HCL 4 MG/2ML IJ SOLN
8.0000 mg | Freq: Once | INTRAMUSCULAR | Status: AC
  Administered 2021-01-26: 8 mg via INTRAVENOUS

## 2021-01-26 MED ORDER — SODIUM CHLORIDE 0.9% FLUSH
10.0000 mL | INTRAVENOUS | Status: DC | PRN
  Administered 2021-01-26: 10 mL
  Filled 2021-01-26: qty 10

## 2021-01-26 MED ORDER — ONDANSETRON HCL 4 MG/2ML IJ SOLN
INTRAMUSCULAR | Status: AC
  Filled 2021-01-26: qty 4

## 2021-01-26 MED ORDER — SODIUM CHLORIDE 0.9 % IV SOLN
Freq: Once | INTRAVENOUS | Status: AC
  Filled 2021-01-26: qty 250

## 2021-01-26 MED ORDER — MAGNESIUM SULFATE 4 GM/100ML IV SOLN
INTRAVENOUS | Status: AC
  Filled 2021-01-26: qty 100

## 2021-01-26 MED ORDER — SODIUM CHLORIDE 0.9 % IV SOLN
800.0000 mg/m2 | Freq: Once | INTRAVENOUS | Status: AC
  Administered 2021-01-26: 1330 mg via INTRAVENOUS
  Filled 2021-01-26: qty 26.3

## 2021-01-26 MED ORDER — EPOETIN ALFA-EPBX 10000 UNIT/ML IJ SOLN
INTRAMUSCULAR | Status: AC
  Filled 2021-01-26: qty 2

## 2021-01-26 MED ORDER — EPOETIN ALFA-EPBX 10000 UNIT/ML IJ SOLN
10000.0000 [IU] | Freq: Once | INTRAMUSCULAR | Status: AC
  Administered 2021-01-26: 10000 [IU] via SUBCUTANEOUS

## 2021-01-26 MED ORDER — SODIUM CHLORIDE 0.9 % IV SOLN
Freq: Once | INTRAVENOUS | Status: DC
  Filled 2021-01-26: qty 250

## 2021-01-26 MED ORDER — MAGNESIUM SULFATE 4 GM/100ML IV SOLN
4.0000 g | Freq: Once | INTRAVENOUS | Status: AC
  Administered 2021-01-26: 4 g via INTRAVENOUS

## 2021-01-26 MED ORDER — OCTREOTIDE ACETATE 30 MG IM KIT
PACK | INTRAMUSCULAR | Status: AC
  Filled 2021-01-26: qty 1

## 2021-01-26 MED ORDER — HEPARIN SOD (PORK) LOCK FLUSH 100 UNIT/ML IV SOLN
500.0000 [IU] | Freq: Once | INTRAVENOUS | Status: AC | PRN
  Administered 2021-01-26: 500 [IU]
  Filled 2021-01-26: qty 5

## 2021-01-26 NOTE — Progress Notes (Signed)
1432:PT STABLE AT TIME OF DISCHARGE

## 2021-01-26 NOTE — Patient Instructions (Addendum)
[facility]  Discharge Instructions: Thank you for choosing [facility] to provide your oncology and hematology care.  If you have a lab appointment with the [facility], please go directly to the [facility] and check in at the registration area.   Wear comfortable clothing and clothing appropriate for easy access to any Portacath or PICC line.   We strive to give you quality time with your provider. You may need to reschedule your appointment if you arrive late (15 or more minutes).  Arriving late affects you and other patients whose appointments are after yours.  Also, if you miss three or more appointments without notifying the office, you may be dismissed from the clinic at the provider's discretion.      For prescription refill requests, have your pharmacy contact our office and allow 72 hours for refills to be completed.    Today you received the following chemotherapy and/or immunotherapy agents Gemcitabine   To help prevent nausea and vomiting after your treatment, we encourage you to take your nausea medication as directed.  BELOW ARE SYMPTOMS THAT SHOULD BE REPORTED IMMEDIATELY: *FEVER GREATER THAN 100.4 F (38 C) OR HIGHER *CHILLS OR SWEATING *NAUSEA AND VOMITING THAT IS NOT CONTROLLED WITH YOUR NAUSEA MEDICATION *UNUSUAL SHORTNESS OF BREATH *UNUSUAL BRUISING OR BLEEDING *URINARY PROBLEMS (pain or burning when urinating, or frequent urination) *BOWEL PROBLEMS (unusual diarrhea, constipation, pain near the anus) TENDERNESS IN MOUTH AND THROAT WITH OR WITHOUT PRESENCE OF ULCERS (sore throat, sores in mouth, or a toothache) UNUSUAL RASH, SWELLING OR PAIN  UNUSUAL VAGINAL DISCHARGE OR ITCHING   Items with * indicate a potential emergency and should be followed up as soon as possible or go to the Emergency Department if any problems should occur.  Please show the CHEMOTHERAPY ALERT CARD or IMMUNOTHERAPY ALERT CARD at check-in to the  Emergency Department and triage nurse.  Should you have questions after your visit or need to cancel or reschedule your appointment, please contact [facility]  Dept: [phone]  and follow the prompts.  Office hours are 8:00 a.m. to 4:30 p.m. Monday - Friday. Please note that voicemails left after 4:00 p.m. may not be returned until the following business day.  We are closed weekends and major holidays. You have access to a nurse at all times for urgent questions. Please call the main number to the clinic Dept: [phone] and follow the prompts.  For any non-urgent questions, you may also contact your provider using MyChart. We now offer e-Visits for anyone 86 and older to request care online for non-urgent symptoms. For details visit [email or web address].   Also download the MyChart app! Go to the app store, search "MyChart", open the app, select [facility], and log in with your MyChart username and password.  Due to Covid, a mask is required upon entering the hospital/clinic. If you do not have a mask, one will be given to you upon arrival. For doctor visits, patients may have 1 support person aged 64 or older with them. For treatment visits, patients cannot have anyone with them due to current Covid guidelines and our immunocompromised population.   Magnesium Sulfate Injection What is this medication? MAGNESIUM SULFATE (mag NEE zee um SUL fate) prevents and treats low levels of magnesium in your body. It may also be used to prevent and treat seizures during pregnancy in people with high blood pressure disorders, such as preeclampsia or eclampsia. Magnesium plays an important role  in maintaining thehealth of your muscles and nervous system. This medicine may be used for other purposes; ask your health care provider orpharmacist if you have questions. What should I tell my care team before I take this medication? They need to know if you have any of these  conditions: Heart disease History of irregular heart beat Kidney disease An unusual or allergic reaction to magnesium sulfate, medications, foods, dyes, or preservatives Pregnant or trying to get pregnant Breast-feeding How should I use this medication? This medication is for infusion into a vein. It is given in a hospital orclinic setting. Talk to your care team about the use of this medication in children. While thismedication may be prescribed for selected conditions, precautions do apply. Overdosage: If you think you have taken too much of this medicine contact apoison control center or emergency room at once. NOTE: This medicine is only for you. Do not share this medicine with others. What if I miss a dose? This does not apply. What may interact with this medication? Certain medications for anxiety or sleep Certain medications for seizures like phenobarbital Digoxin Medications that relax muscles for surgery Narcotic medications for pain This list may not describe all possible interactions. Give your health care provider a list of all the medicines, herbs, non-prescription drugs, or dietary supplements you use. Also tell them if you smoke, drink alcohol, or use illegaldrugs. Some items may interact with your medicine. What should I watch for while using this medication? Your condition will be monitored carefully while you are receiving this medication. You may need blood work done while you are receiving thismedication. What side effects may I notice from receiving this medication? Side effects that you should report to your care team as soon as possible: Allergic reactions-skin rash, itching, hives, swelling of the face, lips, tongue, or throat High magnesium level-confusion, drowsiness, facial flushing, redness, sweating, muscle weakness, fast or irregular heartbeat, trouble breathing Low blood pressure-dizziness, feeling faint or lightheaded, blurry vision Side effects that  usually do not require medical attention (report to your careteam if they continue or are bothersome): Headache Nausea This list may not describe all possible side effects. Call your doctor for medical advice about side effects. You may report side effects to FDA at1-800-FDA-1088. Where should I keep my medication? This medication is given in a hospital or clinic and will not be stored at home. NOTE: This sheet is a summary. It may not cover all possible information. If you have questions about this medicine, talk to your doctor, pharmacist, orhealth care provider.  2022 Elsevier/Gold Standard (2020-07-28 13:10:26) Octreotide acetate injection suspension What is this medication? OCTREOTIDE (ok TREE oh tide) is used to reduce blood levels of growth hormone in patients with a condition called acromegaly. This medicine also reducesflushing and watery diarrhea caused by certain types of cancer. This medicine may be used for other purposes; ask your health care provider orpharmacist if you have questions. COMMON BRAND NAME(S): Sandostatin LAR What should I tell my care team before I take this medication? They need to know if you have any of these conditions: diabetes gallbladder disease kidney disease liver disease thyroid disease an unusual or allergic reaction to octreotide, other medicines, foods, dyes, or preservatives pregnant or trying to get pregnant breast-feeding How should I use this medication? This medicine is for injection into a muscle. It is usually given by a healthcare professional in a hospital or clinic setting. Talk to your pediatrician regarding the use of this medicine in children.Special  care may be needed. Overdosage: If you think you have taken too much of this medicine contact apoison control center or emergency room at once. NOTE: This medicine is only for you. Do not share this medicine with others. What if I miss a dose? Keep appointments for follow-up doses. It  is important not to miss your dose. Call your doctor or health care professional if you are unable to keep anappointment. What may interact with this medication? Do not take this medicine with any of the following medications: cisapride dronedarone flibanserin lutetium Lu 177 dotatate pimozide saquinavir thioridazine This medicine may also interact with the following medications: bromocriptine certain medicines for blood pressure, heart disease, irregular heartbeat cyclosporine diuretics medicines for diabetes, including insulin quinidine This list may not describe all possible interactions. Give your health care provider a list of all the medicines, herbs, non-prescription drugs, or dietary supplements you use. Also tell them if you smoke, drink alcohol, or use illegaldrugs. Some items may interact with your medicine. What should I watch for while using this medication? Visit your health care professional for regular checks on your progress. Tell your health care professional if your symptoms do not start to get better or ifthey get worse. This medicine may cause decreases in blood sugar. Signs of low blood sugar include chills, cool, pale skin or cold sweats, drowsiness, extreme hunger, fast heartbeat, headache, nausea, nervousness or anxiety, shakiness, trembling, unsteadiness, tiredness, or weakness. Contact your doctor or health careprofessional right away if you experience any of these symptoms. This medicine may increase blood sugar. Ask your healthcare provider if changesin diet or medicines are needed if you have diabetes. This medicine may cause a decrease in vitamin B12. You should make sure that you get enough vitamin B12 while you are taking this medicine. Discuss thefoods you eat and the vitamins you take with your health care professional. What side effects may I notice from receiving this medication? Side effects that you should report to your doctor or health care  professionalas soon as possible: allergic reactions like skin rash, itching or hives, swelling of the face, lips, or tongue fast, slow, or irregular heartbeat right upper belly pain severe stomach pain signs and symptoms of high blood sugar such as being more thirsty or hungry or having to urinate more than normal. You may also feel very tired or have blurry vision. signs and symptoms of low blood sugar such as feeling anxious; confusion; dizziness; increased hunger; unusually weak or tired; increased sweating; shakiness; cold, clammy skin; irritable; headache; blurred vision; fast heartbeat; loss of consciousness unusually weak or tired Side effects that usually do not require medical attention (report these toyour doctor or health care professional if they continue or are bothersome): diarrhea dizziness gas headache nausea, vomiting pain, redness, or irritation at site where injected upset stomach This list may not describe all possible side effects. Call your doctor for medical advice about side effects. You may report side effects to FDA at1-800-FDA-1088. Where should I keep my medication? This medicine is given in a hospital or clinic and will not be stored at home. NOTE: This sheet is a summary. It may not cover all possible information. If you have questions about this medicine, talk to your doctor, pharmacist, orhealth care provider.  2022 Elsevier/Gold Standard (2019-09-25 18:31:50) Epoetin Alfa injection What is this medication? EPOETIN ALFA (e POE e tin AL fa) helps your body make more red blood cells. This medicine is used to treat anemia caused by chronic kidney  disease, cancer chemotherapy, or HIV-therapy. It may also be used before surgery if you haveanemia. This medicine may be used for other purposes; ask your health care provider orpharmacist if you have questions. COMMON BRAND NAME(S): Epogen, Procrit, Retacrit What should I tell my care team before I take this  medication? They need to know if you have any of these conditions: cancer heart disease high blood pressure history of blood clots history of stroke low levels of folate, iron, or vitamin B12 in the blood seizures an unusual or allergic reaction to erythropoietin, albumin, benzyl alcohol, hamster proteins, other medicines, foods, dyes, or preservatives pregnant or trying to get pregnant breast-feeding How should I use this medication? This medicine is for injection into a vein or under the skin. It is [place] by a health care professional in a hospital or clinic setting. If you get this medicine at home, you will be taught how to prepare and give this medicine. Use exactly as directed. Take your medicine at regularintervals. Do not take your medicine more often than directed. It is important that you put your used needles and syringes in a special sharps container. Do not put them in a trash can. If you do not have a sharpscontainer, call your pharmacist or healthcare provider to get one. A special MedGuide will be given to you by the pharmacist with eachprescription and refill. Be sure to read this information carefully each time. Talk to your pediatrician regarding the use of this medicine in children. Whilethis drug may be prescribed for selected conditions, precautions do apply. Overdosage: If you think you have taken too much of this medicine contact apoison control center or emergency room at once. NOTE: This medicine is only for you. Do not share this medicine with others. What if I miss a dose? If you miss a dose, take it as soon as you can. If it is almost time for yournext dose, take only that dose. Do not take double or extra doses. What may interact with this medication? Interactions have not been studied. This list may not describe all possible interactions. Give your health care provider a list of all the medicines, herbs, non-prescription drugs, or dietary supplements you  use. Also tell them if you smoke, drink alcohol, or use illegaldrugs. Some items may interact with your medicine. What should I watch for while using this medication? Your condition will be monitored carefully while you are receiving thismedicine. You may need blood work done while you are taking this medicine. This medicine may cause a decrease in vitamin B6. You should make sure that you get enough vitamin B6 while you are taking this medicine. Discuss the foods youeat and the vitamins you take with your health care professional. What side effects may I notice from receiving this medication? Side effects that you should report to your doctor or health care professionalas soon as possible: allergic reactions like skin rash, itching or hives, swelling of the face, lips, or tongue seizures signs and symptoms of a blood clot such as breathing problems; changes in vision; chest pain; severe, sudden headache; pain, swelling, warmth in the leg; trouble speaking; sudden numbness or weakness of the face, arm or leg signs and symptoms of a stroke like changes in vision; confusion; trouble speaking or understanding; severe headaches; sudden numbness or weakness of the face, arm or leg; trouble walking; dizziness; loss of balance or coordination Side effects that usually do not require medical attention (report to yourdoctor or health care professional if they  continue or are bothersome): chills cough dizziness fever headaches joint pain muscle cramps muscle pain nausea, vomiting pain, redness, or irritation at site where injected This list may not describe all possible side effects. Call your doctor for medical advice about side effects. You may report side effects to FDA at1-800-FDA-1088. Where should I keep my medication? Keep out of the reach of children. Store in a refrigerator between 2 and 8 degrees C (36 and 46 degrees F). Do not freeze or shake. Throw away any unused portion if using a  single-dose vial. Multi-dose vials can be kept in the refrigerator for up to 21 days after theinitial dose. Throw away unused medicine. NOTE: This sheet is a summary. It may not cover all possible information. If you have questions about this medicine, talk to your doctor, pharmacist, orhealth care provider.  2022 Elsevier/Gold Standard (2017-01-14 08:35:19)

## 2021-01-28 ENCOUNTER — Inpatient Hospital Stay: Payer: PPO

## 2021-01-28 ENCOUNTER — Other Ambulatory Visit: Payer: Self-pay

## 2021-01-28 VITALS — BP 128/61 | HR 78 | Temp 98.3°F | Resp 18 | Ht 65.5 in | Wt 124.0 lb

## 2021-01-28 DIAGNOSIS — Z5111 Encounter for antineoplastic chemotherapy: Secondary | ICD-10-CM | POA: Diagnosis not present

## 2021-01-28 DIAGNOSIS — C50111 Malignant neoplasm of central portion of right female breast: Secondary | ICD-10-CM

## 2021-01-28 MED ORDER — PEGFILGRASTIM-JMDB 6 MG/0.6ML ~~LOC~~ SOSY
PREFILLED_SYRINGE | SUBCUTANEOUS | Status: AC
  Filled 2021-01-28: qty 0.6

## 2021-01-28 MED ORDER — PEGFILGRASTIM-JMDB 6 MG/0.6ML ~~LOC~~ SOSY
6.0000 mg | PREFILLED_SYRINGE | Freq: Once | SUBCUTANEOUS | Status: AC
Start: 2021-01-28 — End: 2021-01-28
  Administered 2021-01-28: 6 mg via SUBCUTANEOUS

## 2021-01-28 NOTE — Patient Instructions (Signed)

## 2021-01-29 NOTE — Progress Notes (Signed)
[facility]  [address] [place],  [place]  [address] [phone]  Clinic Day:  02/05/2021  Referring physician: Greig Right, MD  This document serves as a record of services personally performed by Yousuf Ager Macarthur Critchley, MD. It was created on their behalf by [facility], a trained medical scribe. The creation of this record is based on the scribe's personal observations and the provider's statements to them.  HISTORY OF PRESENT ILLNESS:  [facility] is a 60 y.o. female with metastatic hormone receptor positive breast cancer, which includes spread of disease to her stomach.   She comes in today to be evaluated before heading into her 6th cycle of carboplatin/gemcitabine.  Of note, recent scans showed an excellent response to her 1st 4 cycles of chemotherapy.  The patient claims to have tolerated her 5th cycle of chemotherapy fairly well.  She still has intermittent diarrhea and abdominal pain, which has been manageable to date.  Her major problem today is coccygeal pain, which developed suddenly 9 days ago when she bent down to pick up something.  Despite pain medications, this pain remains an issue.  Otherwise, she denies having any new symptoms or findings which concern her for disease progression.  VITALS:  Blood pressure (!) 122/56, pulse 85, temperature 97.8 F (36.6 C), resp. rate 16, height 5' 5.5" (1.664 m), weight 128 lb 3.2 oz (58.2 kg), SpO2 100 %.  Wt Readings from Last 3 Encounters:  02/05/21 128 lb 3.2 oz (58.2 kg)  01/28/21 124 lb (56.2 kg)  01/26/21 122 lb (55.3 kg)    Body mass index is 21.01 kg/m.  Performance status (ECOG): 0 - Asymptomatic  PHYSICAL EXAM:  Physical Exam Constitutional:      General: She is not in acute distress.    Appearance: Normal appearance. She is normal weight.  HENT:     Head: Normocephalic and atraumatic.  Eyes:     General: No scleral icterus.    Extraocular Movements: Extraocular movements  intact.     Conjunctiva/sclera: Conjunctivae normal.     Pupils: Pupils are equal, round, and reactive to light.  Cardiovascular:     Rate and Rhythm: Normal rate and regular rhythm.     Pulses: Normal pulses.     Heart sounds: Normal heart sounds. No murmur heard.   No friction rub. No gallop.  Pulmonary:     Effort: Pulmonary effort is normal. No respiratory distress.     Breath sounds: Normal breath sounds.  Abdominal:     General: Bowel sounds are normal. There is no distension.     Palpations: Abdomen is soft. There is no hepatomegaly, splenomegaly or mass.     Tenderness: There is no abdominal tenderness.  Musculoskeletal:        General: Normal range of motion.     Cervical back: Normal range of motion and neck supple.     Right lower leg: No edema.     Left lower leg: No edema.     Comments: No tenderness elicited upon palpation of lower back/spine  Lymphadenopathy:     Cervical: No cervical adenopathy.  Skin:    General: Skin is warm and dry.  Neurological:     General: No focal deficit present.     Mental Status: She is alert and oriented to person, place, and time. Mental status is at baseline.  Psychiatric:        Mood and Affect: Mood normal.  Behavior: Behavior normal.        Thought Content: Thought content normal.        Judgment: Judgment normal.    LABS:   CBC Latest Ref Rng & Units 02/05/2021 01/23/2021 01/14/2021  WBC - 12.0 7.2 20.0  Hemoglobin 12.0 - 16.0 8.2(A) 9.7(A) 7.8(A)  Hematocrit 36 - 46 26(A) 30(A) 25(A)  Platelets 150 - 399 58(A) 447(A) 105(A)   CMP Latest Ref Rng & Units 01/23/2021 01/14/2021 01/01/2021  BUN 4 - '21 20 19 18  '$ Creatinine 0.5 - 1.1 0.8 0.9 0.8  Sodium 137 - 147 137 139 140  Potassium 3.4 - 5.3 4.3 3.7 3.8  Chloride 99 - 108 104 104 106  CO2 13 - 22 24(A) 27(A) 21  Calcium 8.7 - 10.7 8.6(A) 6.8(A) 8.0(A)  Total Protein 6.3 - 8.2 g/dL - - -  Alkaline Phos 25 - 125 107 153(A) 103  AST 13 - 35 34 27 45(A)  ALT 7 - 35 25 17  35    ASSESSMENT & PLAN:   Assessment/Plan:  A 60 y.o. female with metastatic hormone positive breast cancer.  She will proceed with her 6th cycle of carboplatin/gemcitabine next week.  With respect to her coccygeal pain, I will increase her oxycodone to 15 mg Q 6 hours. I have also told her to use ibuprofen 600-800 mg Q 6 hours.  She will alternate these medicines to where she will be receiving something for pain every 3 hours.   Her peripheral counts are low today, but there is the chance they may normalize over the next few days before she heads into her next cycle of treatment.  Her CBC will be rechecked early next week, which will ultimately determine if her 6th cycle of treatment will be given or delayed.  Otherwise, I will see her back in 3 weeks before she heads into her 7th cycle of carboplatin/gemcitabine. The patient understands the plans discussed today and is in agreement with them.    I, Rita Ohara, am acting as scribe for Marice Potter, MD    I have reviewed this report as typed by the medical scribe, and it is complete and accurate.  Lyle Leisner Macarthur Critchley, MD

## 2021-02-05 ENCOUNTER — Other Ambulatory Visit: Payer: Self-pay

## 2021-02-05 ENCOUNTER — Other Ambulatory Visit: Payer: Self-pay | Admitting: Oncology

## 2021-02-05 ENCOUNTER — Inpatient Hospital Stay: Payer: PPO

## 2021-02-05 ENCOUNTER — Encounter: Payer: Self-pay | Admitting: Oncology

## 2021-02-05 ENCOUNTER — Inpatient Hospital Stay: Payer: PPO | Admitting: Oncology

## 2021-02-05 VITALS — BP 122/56 | HR 85 | Temp 97.8°F | Resp 16 | Ht 65.5 in | Wt 128.2 lb

## 2021-02-05 DIAGNOSIS — C50111 Malignant neoplasm of central portion of right female breast: Secondary | ICD-10-CM | POA: Diagnosis not present

## 2021-02-05 DIAGNOSIS — C7889 Secondary malignant neoplasm of other digestive organs: Secondary | ICD-10-CM

## 2021-02-05 DIAGNOSIS — Z170 Estrogen receptor positive status [ER+]: Secondary | ICD-10-CM

## 2021-02-05 DIAGNOSIS — C785 Secondary malignant neoplasm of large intestine and rectum: Secondary | ICD-10-CM | POA: Diagnosis not present

## 2021-02-05 LAB — CBC AND DIFFERENTIAL
HCT: 26 — AB (ref 36–46)
Hemoglobin: 8.2 — AB (ref 12.0–16.0)
Neutrophils Absolute: 8.4
Platelets: 58 — AB (ref 150–399)
WBC: 12

## 2021-02-05 LAB — BASIC METABOLIC PANEL
BUN: 17 (ref 4–21)
CO2: 24 — AB (ref 13–22)
Chloride: 108 (ref 99–108)
Creatinine: 0.8 (ref 0.5–1.1)
Glucose: 93
Potassium: 4.7 (ref 3.4–5.3)
Sodium: 139 (ref 137–147)

## 2021-02-05 LAB — HEPATIC FUNCTION PANEL
ALT: 16 (ref 7–35)
AST: 28 (ref 13–35)
Alkaline Phosphatase: 147 — AB (ref 25–125)
Bilirubin, Total: 0.2

## 2021-02-05 LAB — COMPREHENSIVE METABOLIC PANEL
Albumin: 3.8 (ref 3.5–5.0)
Calcium: 8.7 (ref 8.7–10.7)

## 2021-02-05 LAB — CBC: RBC: 2.23 — AB (ref 3.87–5.11)

## 2021-02-05 MED ORDER — OXYCODONE HCL 15 MG PO TABS: 15.0000 mg | ORAL_TABLET | Freq: Four times a day (QID) | ORAL | 0 refills | Status: DC | PRN

## 2021-02-06 MED FILL — Carboplatin IV Soln 600 MG/60ML: INTRAVENOUS | Qty: 43 | Status: AC

## 2021-02-06 MED FILL — Fosaprepitant Dimeglumine For IV Infusion 150 MG (Base Eq): INTRAVENOUS | Qty: 5 | Status: AC

## 2021-02-06 MED FILL — Gemcitabine HCl Inj 1 GM/26.3ML (38 MG/ML) (Base Equiv): INTRAVENOUS | Qty: 35 | Status: AC

## 2021-02-06 MED FILL — Dexamethasone Sodium Phosphate Inj 100 MG/10ML: INTRAMUSCULAR | Qty: 1 | Status: AC

## 2021-02-09 ENCOUNTER — Inpatient Hospital Stay: Payer: PPO

## 2021-02-09 ENCOUNTER — Other Ambulatory Visit: Payer: Self-pay

## 2021-02-09 ENCOUNTER — Encounter: Payer: Self-pay | Admitting: Hematology and Oncology

## 2021-02-09 VITALS — BP 113/54 | HR 83 | Temp 98.3°F | Resp 18 | Ht 65.5 in | Wt 129.5 lb

## 2021-02-09 DIAGNOSIS — C50111 Malignant neoplasm of central portion of right female breast: Secondary | ICD-10-CM

## 2021-02-09 DIAGNOSIS — D649 Anemia, unspecified: Secondary | ICD-10-CM | POA: Diagnosis not present

## 2021-02-09 DIAGNOSIS — Z5111 Encounter for antineoplastic chemotherapy: Secondary | ICD-10-CM | POA: Diagnosis not present

## 2021-02-09 DIAGNOSIS — Z170 Estrogen receptor positive status [ER+]: Secondary | ICD-10-CM

## 2021-02-09 DIAGNOSIS — C785 Secondary malignant neoplasm of large intestine and rectum: Secondary | ICD-10-CM

## 2021-02-09 DIAGNOSIS — C7889 Secondary malignant neoplasm of other digestive organs: Secondary | ICD-10-CM

## 2021-02-09 LAB — COMPREHENSIVE METABOLIC PANEL
Albumin: 3.7 (ref 3.5–5.0)
Calcium: 8.8 (ref 8.7–10.7)

## 2021-02-09 LAB — CBC AND DIFFERENTIAL
HCT: 28 — AB (ref 36–46)
Hemoglobin: 9 — AB (ref 12.0–16.0)
Neutrophils Absolute: 4.1
Platelets: 242 (ref 150–399)
WBC: 6.5

## 2021-02-09 LAB — HEPATIC FUNCTION PANEL
ALT: 13 (ref 7–35)
AST: 33 (ref 13–35)
Alkaline Phosphatase: 136 — AB (ref 25–125)
Bilirubin, Total: 0.2

## 2021-02-09 LAB — BASIC METABOLIC PANEL
BUN: 20 (ref 4–21)
CO2: 26 — AB (ref 13–22)
Chloride: 107 (ref 99–108)
Creatinine: 1 (ref 0.5–1.1)
Glucose: 88
Potassium: 4.1 (ref 3.4–5.3)
Sodium: 140 (ref 137–147)

## 2021-02-09 LAB — CBC: RBC: 2.41 — AB (ref 3.87–5.11)

## 2021-02-09 MED ORDER — SODIUM CHLORIDE 0.9 % IV SOLN
150.0000 mg | Freq: Once | INTRAVENOUS | Status: AC
  Administered 2021-02-09: 150 mg via INTRAVENOUS
  Filled 2021-02-09: qty 150

## 2021-02-09 MED ORDER — SODIUM CHLORIDE 0.9 % IV SOLN
430.0000 mg | Freq: Once | INTRAVENOUS | Status: AC
  Administered 2021-02-09: 430 mg via INTRAVENOUS
  Filled 2021-02-09: qty 43

## 2021-02-09 MED ORDER — SODIUM CHLORIDE 0.9% FLUSH
10.0000 mL | INTRAVENOUS | Status: DC | PRN
  Administered 2021-02-09: 10 mL

## 2021-02-09 MED ORDER — SODIUM CHLORIDE 0.9 % IV SOLN
800.0000 mg/m2 | Freq: Once | INTRAVENOUS | Status: AC
  Administered 2021-02-09: 1330 mg via INTRAVENOUS
  Filled 2021-02-09: qty 26.3

## 2021-02-09 MED ORDER — PALONOSETRON HCL INJECTION 0.25 MG/5ML
0.2500 mg | Freq: Once | INTRAVENOUS | Status: AC
  Administered 2021-02-09: 0.25 mg via INTRAVENOUS
  Filled 2021-02-09: qty 5

## 2021-02-09 MED ORDER — HEPARIN SOD (PORK) LOCK FLUSH 100 UNIT/ML IV SOLN
500.0000 [IU] | Freq: Once | INTRAVENOUS | Status: AC | PRN
  Administered 2021-02-09: 500 [IU]

## 2021-02-09 MED ORDER — SODIUM CHLORIDE 0.9 % IV SOLN
10.0000 mg | Freq: Once | INTRAVENOUS | Status: AC
  Administered 2021-02-09: 10 mg via INTRAVENOUS
  Filled 2021-02-09: qty 10

## 2021-02-09 MED ORDER — SODIUM CHLORIDE 0.9 % IV SOLN: Freq: Once | INTRAVENOUS | Status: AC

## 2021-02-09 NOTE — Patient Instructions (Signed)
[facility]  Discharge Instructions: ?Thank you for choosing [facility] to provide your oncology and hematology care.  ?If you have a lab appointment with the Cancer Center, please go directly to the Cancer Center and check in at the registration area. ?  ?Wear comfortable clothing and clothing appropriate for easy access to any Portacath or PICC line.  ? ?We strive to give you quality time with your provider. You may need to reschedule your appointment if you arrive late (15 or more minutes).  Arriving late affects you and other patients whose appointments are after yours.  Also, if you miss three or more appointments without notifying the office, you may be dismissed from the clinic at the provider?s discretion.    ?  ?For prescription refill requests, have your pharmacy contact our office and allow 72 hours for refills to be completed.   ? ?Today you received the following chemotherapy and/or immunotherapy agents Carboplatin,Gemcitabine   ?  ?To help prevent nausea and vomiting after your treatment, we encourage you to take your nausea medication as directed. ? ?BELOW ARE SYMPTOMS THAT SHOULD BE REPORTED IMMEDIATELY: ?*FEVER GREATER THAN 100.4 F (38 ?C) OR HIGHER ?*CHILLS OR SWEATING ?*NAUSEA AND VOMITING THAT IS NOT CONTROLLED WITH YOUR NAUSEA MEDICATION ?*UNUSUAL SHORTNESS OF BREATH ?*UNUSUAL BRUISING OR BLEEDING ?*URINARY PROBLEMS (pain or burning when urinating, or frequent urination) ?*BOWEL PROBLEMS (unusual diarrhea, constipation, pain near the anus) ?TENDERNESS IN MOUTH AND THROAT WITH OR WITHOUT PRESENCE OF ULCERS (sore throat, sores in mouth, or a toothache) ?UNUSUAL RASH, SWELLING OR PAIN  ?UNUSUAL VAGINAL DISCHARGE OR ITCHING  ? ?Items with * indicate a potential emergency and should be followed up as soon as possible or go to the Emergency Department if any problems should occur. ? ?Please show the CHEMOTHERAPY ALERT CARD or IMMUNOTHERAPY ALERT CARD at  check-in to the Emergency Department and triage nurse. ? ?Should you have questions after your visit or need to cancel or reschedule your appointment, please contact [facility]  Dept: [phone]  and follow the prompts.  Office hours are 8:00 a.m. to 4:30 p.m. Monday - Friday. Please note that voicemails left after 4:00 p.m. may not be returned until the following business day.  We are closed weekends and major holidays. You have access to a nurse at all times for urgent questions. Please call the main number to the clinic Dept: [phone] and follow the prompts. ? ?For any non-urgent questions, you may also contact your provider using MyChart. We now offer e-Visits for anyone 18 and older to request care online for non-urgent symptoms. For details visit [email or web address]. ?  ?Also download the MyChart app! Go to the app store, search "MyChart", open the app, select , and log in with your MyChart username and password. ? ?Due to Covid, a mask is required upon entering the hospital/clinic. If you do not have a mask, one will be given to you upon arrival. For doctor visits, patients may have 1 support person aged 18 or older with them. For treatment visits, patients cannot have anyone with them due to current Covid guidelines and our immunocompromised population.  ? ? ?

## 2021-02-09 NOTE — Progress Notes (Signed)
1247: PT STABLE AT TIME OF DISCHARGE  

## 2021-02-10 ENCOUNTER — Encounter: Payer: Self-pay | Admitting: Oncology

## 2021-02-13 ENCOUNTER — Inpatient Hospital Stay: Payer: PPO

## 2021-02-13 ENCOUNTER — Other Ambulatory Visit: Payer: Self-pay

## 2021-02-13 ENCOUNTER — Encounter: Payer: Self-pay | Admitting: Oncology

## 2021-02-13 DIAGNOSIS — C50111 Malignant neoplasm of central portion of right female breast: Secondary | ICD-10-CM

## 2021-02-13 DIAGNOSIS — C7889 Secondary malignant neoplasm of other digestive organs: Secondary | ICD-10-CM

## 2021-02-13 DIAGNOSIS — C785 Secondary malignant neoplasm of large intestine and rectum: Secondary | ICD-10-CM

## 2021-02-13 DIAGNOSIS — D649 Anemia, unspecified: Secondary | ICD-10-CM | POA: Diagnosis not present

## 2021-02-13 MED FILL — Gemcitabine HCl Inj 1 GM/26.3ML (38 MG/ML) (Base Equiv): INTRAVENOUS | Qty: 35 | Status: AC

## 2021-02-16 ENCOUNTER — Inpatient Hospital Stay: Payer: PPO

## 2021-02-16 ENCOUNTER — Encounter: Payer: Self-pay | Admitting: Oncology

## 2021-02-16 ENCOUNTER — Other Ambulatory Visit: Payer: Self-pay

## 2021-02-16 VITALS — BP 90/61 | HR 94 | Temp 98.0°F | Resp 18 | Ht 65.5 in | Wt 127.1 lb

## 2021-02-16 DIAGNOSIS — C50111 Malignant neoplasm of central portion of right female breast: Secondary | ICD-10-CM

## 2021-02-16 DIAGNOSIS — Z170 Estrogen receptor positive status [ER+]: Secondary | ICD-10-CM

## 2021-02-16 DIAGNOSIS — Z5111 Encounter for antineoplastic chemotherapy: Secondary | ICD-10-CM | POA: Diagnosis not present

## 2021-02-16 MED ORDER — HEPARIN SOD (PORK) LOCK FLUSH 100 UNIT/ML IV SOLN
500.0000 [IU] | Freq: Once | INTRAVENOUS | Status: AC | PRN
  Administered 2021-02-16: 500 [IU]

## 2021-02-16 MED ORDER — MAGNESIUM SULFATE 2 GM/50ML IV SOLN
2.0000 g | Freq: Once | INTRAVENOUS | Status: AC
  Administered 2021-02-16: 2 g via INTRAVENOUS
  Filled 2021-02-16: qty 50

## 2021-02-16 MED ORDER — SODIUM CHLORIDE 0.9 % IV SOLN
800.0000 mg/m2 | Freq: Once | INTRAVENOUS | Status: AC
  Administered 2021-02-16: 1330 mg via INTRAVENOUS
  Filled 2021-02-16: qty 26.3

## 2021-02-16 MED ORDER — SODIUM CHLORIDE 0.9% FLUSH
10.0000 mL | INTRAVENOUS | Status: DC | PRN
  Administered 2021-02-16: 10 mL

## 2021-02-16 MED ORDER — ONDANSETRON HCL 4 MG/2ML IJ SOLN
8.0000 mg | Freq: Once | INTRAMUSCULAR | Status: AC
  Administered 2021-02-16: 8 mg via INTRAVENOUS
  Filled 2021-02-16: qty 4

## 2021-02-16 MED ORDER — SODIUM CHLORIDE 0.9 % IV SOLN: Freq: Once | INTRAVENOUS | Status: AC

## 2021-02-16 MED ORDER — ONDANSETRON HCL 4 MG/2ML IJ SOLN
INTRAMUSCULAR | Status: AC
  Filled 2021-02-16: qty 4

## 2021-02-16 MED ORDER — SODIUM CHLORIDE 0.9 % IV SOLN: Freq: Once | INTRAVENOUS | Status: DC

## 2021-02-16 NOTE — Patient Instructions (Signed)
[facility]  Discharge Instructions: Thank you for choosing [facility] to provide your oncology and hematology care.  If you have a lab appointment with the [facility], please go directly to the [facility] and check in at the registration area.   Wear comfortable clothing and clothing appropriate for easy access to any Portacath or PICC line.   We strive to give you quality time with your provider. You may need to reschedule your appointment if you arrive late (15 or more minutes).  Arriving late affects you and other patients whose appointments are after yours.  Also, if you miss three or more appointments without notifying the office, you may be dismissed from the clinic at the provider's discretion.      For prescription refill requests, have your pharmacy contact our office and allow 72 hours for refills to be completed.    Today you received the following chemotherapy and/or immunotherapy agents gemzar    To help prevent nausea and vomiting after your treatment, we encourage you to take your nausea medication as directed.  BELOW ARE SYMPTOMS THAT SHOULD BE REPORTED IMMEDIATELY: *FEVER GREATER THAN 100.4 F (38 C) OR HIGHER *CHILLS OR SWEATING *NAUSEA AND VOMITING THAT IS NOT CONTROLLED WITH YOUR NAUSEA MEDICATION *UNUSUAL SHORTNESS OF BREATH *UNUSUAL BRUISING OR BLEEDING *URINARY PROBLEMS (pain or burning when urinating, or frequent urination) *BOWEL PROBLEMS (unusual diarrhea, constipation, pain near the anus) TENDERNESS IN MOUTH AND THROAT WITH OR WITHOUT PRESENCE OF ULCERS (sore throat, sores in mouth, or a toothache) UNUSUAL RASH, SWELLING OR PAIN  UNUSUAL VAGINAL DISCHARGE OR ITCHING   Items with * indicate a potential emergency and should be followed up as soon as possible or go to the Emergency Department if any problems should occur.  Please show the CHEMOTHERAPY ALERT CARD or IMMUNOTHERAPY ALERT CARD at check-in to the Emergency  Department and triage nurse.  Should you have questions after your visit or need to cancel or reschedule your appointment, please contact [facility]  Dept: [phone]  and follow the prompts.  Office hours are 8:00 a.m. to 4:30 p.m. Monday - Friday. Please note that voicemails left after 4:00 p.m. may not be returned until the following business day.  We are closed weekends and major holidays. You have access to a nurse at all times for urgent questions. Please call the main number to the clinic Dept: [phone] and follow the prompts.  For any non-urgent questions, you may also contact your provider using MyChart. We now offer e-Visits for anyone 32 and older to request care online for non-urgent symptoms. For details visit [email or web address].   Also download the MyChart app! Go to the app store, search "MyChart", open the app, select [facility], and log in with your MyChart username and password.  Due to Covid, a mask is required upon entering the hospital/clinic. If you do not have a mask, one will be given to you upon arrival. For doctor visits, patients may have 1 support person aged 49 or older with them. For treatment visits, patients cannot have anyone with them due to current Covid guidelines and our immunocompromised population.   Hypomagnesemia Hypomagnesemia is a condition in which the level of magnesium in the blood is low. Magnesium is a mineral that is found in many foods. It is used in many different processes in the body. Hypomagnesemia can affect every organ in thebody. In severe cases, it can cause life-threatening problems. What are  the causes? This condition may be caused by: Not getting enough magnesium in your diet. Malnutrition. Problems with absorbing magnesium from the intestines. Dehydration. Alcohol abuse. Vomiting. Severe or chronic diarrhea. Some medicines, including medicines that make you urinate more (diuretics). Certain  diseases, such as kidney disease, diabetes, celiac disease, and overactive thyroid. What are the signs or symptoms? Symptoms of this condition include: Loss of appetite. Nausea and vomiting. Involuntary shaking or trembling of a body part (tremor). Muscle weakness. Tingling in the arms and legs. Sudden tightening of muscles (muscle spasms). Confusion. Psychiatric issues, such as depression, irritability, or psychosis. A feeling of fluttering of the heart. Seizures. These symptoms are more severe if magnesium levels drop suddenly. How is this diagnosed? This condition may be diagnosed based on: Your symptoms and medical history. A physical exam. Blood and urine tests. How is this treated? Treatment depends on the cause and the severity of the condition. It may be treated with: A magnesium supplement. This can be taken in pill form. If the condition is severe, magnesium is usually given through an IV. Changes to your diet. You may be directed to eat foods that have a lot of magnesium, such as green leafy vegetables, peas, beans, and nuts. Stopping any intake of alcohol. Follow these instructions at home:     Make sure that your diet includes foods with magnesium. Foods that have a lot of magnesium in them include: Green leafy vegetables, such as spinach and broccoli. Beans and peas. Nuts and seeds, such as almonds and sunflower seeds. Whole grains, such as whole grain bread and fortified cereals. Take magnesium supplements if your health care provider tells you to do that. Take them as directed. Take over-the-counter and prescription medicines only as told by your health care provider. Have your magnesium levels monitored as told by your health care provider. When you are active, drink fluids that contain electrolytes. Avoid drinking alcohol. Keep all follow-up visits as told by your health care provider. This is important. Contact a health care provider if: You get worse  instead of better. Your symptoms return. Get help right away if you: Develop severe muscle weakness. Have trouble breathing. Feel that your heart is racing. Summary Hypomagnesemia is a condition in which the level of magnesium in the blood is low. Hypomagnesemia can affect every organ in the body. Treatment may include eating more foods that contain magnesium, taking magnesium supplements, and not drinking alcohol. Have your magnesium levels monitored as told by your health care provider. This information is not intended to replace advice given to you by your health care provider. Make sure you discuss any questions you have with your healthcare provider. Document Revised: 11/08/2019 Document Reviewed: 11/08/2019 Elsevier Patient Education  [employer]. Gemcitabine injection What is this medication? GEMCITABINE (jem SYE ta been) is a chemotherapy drug. This medicine is used to treat many types of cancer like breast cancer, lung cancer, pancreatic cancer,and ovarian cancer. This medicine may be used for other purposes; ask your health care provider orpharmacist if you have questions. COMMON BRAND NAME(S): Gemzar, Infugem What should I tell my care team before I take this medication? They need to know if you have any of these conditions: blood disorders infection kidney disease liver disease lung or breathing disease, like asthma recent or ongoing radiation therapy an unusual or allergic reaction to gemcitabine, other chemotherapy, other medicines, foods, dyes, or preservatives pregnant or trying to get pregnant breast-feeding How should I use this medication? This drug is  given as an infusion into a vein. It is administered in a hospitalor clinic by a specially trained health care professional. Talk to your pediatrician regarding the use of this medicine in children.Special care may be needed. Overdosage: If you think you have taken too much of this medicine contact apoison  control center or emergency room at once. NOTE: This medicine is only for you. Do not share this medicine with others. What if I miss a dose? It is important not to miss your dose. Call your doctor or health careprofessional if you are unable to keep an appointment. What may interact with this medication? medicines to increase blood counts like filgrastim, pegfilgrastim, sargramostim some other chemotherapy drugs like cisplatin vaccines Talk to your doctor or health care professional before taking any of thesemedicines: acetaminophen aspirin ibuprofen ketoprofen naproxen This list may not describe all possible interactions. Give your health care provider a list of all the medicines, herbs, non-prescription drugs, or dietary supplements you use. Also tell them if you smoke, drink alcohol, or use illegaldrugs. Some items may interact with your medicine. What should I watch for while using this medication? Visit your doctor for checks on your progress. This drug may make you feel generally unwell. This is not uncommon, as chemotherapy can affect healthy cells as well as cancer cells. Report any side effects. Continue your course oftreatment even though you feel ill unless your doctor tells you to stop. In some cases, you may be given additional medicines to help with side effects.Follow all directions for their use. Call your doctor or health care professional for advice if you get a fever, chills or sore throat, or other symptoms of a cold or flu. Do not treat yourself. This drug decreases your body's ability to fight infections. Try toavoid being around people who are sick. This medicine may increase your risk to bruise or bleed. Call your doctor orhealth care professional if you notice any unusual bleeding. Be careful brushing and flossing your teeth or using a toothpick because you may get an infection or bleed more easily. If you have any dental work done,tell your dentist you are receiving  this medicine. Avoid taking products that contain aspirin, acetaminophen, ibuprofen, naproxen, or ketoprofen unless instructed by your doctor. These medicines may hide afever. Do not become pregnant while taking this medicine or for 6 months after stopping it. Women should inform their doctor if they wish to become pregnant or think they might be pregnant. Men should not father a child while taking this medicine and for 3 months after stopping it. There is a potential for serious side effects to an unborn child. Talk to your health care professional or pharmacist for more information. Do not breast-feed an infant while takingthis medicine or for at least 1 week after stopping it. Men should inform their doctors if they wish to father a child. This medicine may lower sperm counts. Talk with your doctor or health care professional ifyou are concerned about your fertility. What side effects may I notice from receiving this medication? Side effects that you should report to your doctor or health care professionalas soon as possible: allergic reactions like skin rash, itching or hives, swelling of the face, lips, or tongue breathing problems pain, redness, or irritation at site where injected signs and symptoms of a dangerous change in heartbeat or heart rhythm like chest pain; dizziness; fast or irregular heartbeat; palpitations; feeling faint or lightheaded, falls; breathing problems signs of decreased platelets or bleeding - bruising, pinpoint  red spots on the skin, black, tarry stools, blood in the urine signs of decreased red blood cells - unusually weak or tired, feeling faint or lightheaded, falls signs of infection - fever or chills, cough, sore throat, pain or difficulty passing urine signs and symptoms of kidney injury like trouble passing urine or change in the amount of urine signs and symptoms of liver injury like dark yellow or brown urine; general ill feeling or flu-like symptoms;  light-colored stools; loss of appetite; nausea; right upper belly pain; unusually weak or tired; yellowing of the eyes or skin swelling of ankles, feet, hands Side effects that usually do not require medical attention (report to yourdoctor or health care professional if they continue or are bothersome): constipation diarrhea hair loss loss of appetite nausea rash vomiting This list may not describe all possible side effects. Call your doctor for medical advice about side effects. You may report side effects to FDA at1-800-FDA-1088. Where should I keep my medication? This drug is given in a hospital or clinic and will not be stored at home. NOTE: This sheet is a summary. It may not cover all possible information. If you have questions about this medicine, talk to your doctor, pharmacist, orhealth care provider.  2022 Elsevier/Gold Standard (2017-08-31 18:06:11)

## 2021-02-18 ENCOUNTER — Encounter: Payer: Self-pay | Admitting: Oncology

## 2021-02-19 ENCOUNTER — Other Ambulatory Visit: Payer: Self-pay | Admitting: Pharmacist

## 2021-02-19 ENCOUNTER — Other Ambulatory Visit: Payer: Self-pay

## 2021-02-19 ENCOUNTER — Other Ambulatory Visit: Payer: Self-pay | Admitting: Hematology and Oncology

## 2021-02-19 ENCOUNTER — Inpatient Hospital Stay: Payer: PPO

## 2021-02-19 ENCOUNTER — Inpatient Hospital Stay: Payer: PPO | Attending: Oncology

## 2021-02-19 VITALS — BP 95/56 | HR 81 | Temp 98.7°F | Resp 18 | Ht 65.5 in | Wt 126.0 lb

## 2021-02-19 DIAGNOSIS — Z170 Estrogen receptor positive status [ER+]: Secondary | ICD-10-CM | POA: Diagnosis not present

## 2021-02-19 DIAGNOSIS — C7889 Secondary malignant neoplasm of other digestive organs: Secondary | ICD-10-CM | POA: Diagnosis not present

## 2021-02-19 DIAGNOSIS — D6481 Anemia due to antineoplastic chemotherapy: Secondary | ICD-10-CM

## 2021-02-19 DIAGNOSIS — C50919 Malignant neoplasm of unspecified site of unspecified female breast: Secondary | ICD-10-CM | POA: Insufficient documentation

## 2021-02-19 DIAGNOSIS — D539 Nutritional anemia, unspecified: Secondary | ICD-10-CM

## 2021-02-19 DIAGNOSIS — C50111 Malignant neoplasm of central portion of right female breast: Secondary | ICD-10-CM | POA: Diagnosis not present

## 2021-02-19 DIAGNOSIS — T451X5A Adverse effect of antineoplastic and immunosuppressive drugs, initial encounter: Secondary | ICD-10-CM

## 2021-02-19 DIAGNOSIS — R197 Diarrhea, unspecified: Secondary | ICD-10-CM

## 2021-02-19 DIAGNOSIS — Z5189 Encounter for other specified aftercare: Secondary | ICD-10-CM | POA: Insufficient documentation

## 2021-02-19 DIAGNOSIS — Z5111 Encounter for antineoplastic chemotherapy: Secondary | ICD-10-CM | POA: Diagnosis not present

## 2021-02-19 DIAGNOSIS — C785 Secondary malignant neoplasm of large intestine and rectum: Secondary | ICD-10-CM

## 2021-02-19 LAB — BASIC METABOLIC PANEL
BUN: 28 — AB (ref 4–21)
CO2: 27 — AB (ref 13–22)
Chloride: 102 (ref 99–108)
Creatinine: 0.9 (ref 0.5–1.1)
Glucose: 115
Potassium: 3.6 (ref 3.4–5.3)
Sodium: 137 (ref 137–147)

## 2021-02-19 LAB — CBC AND DIFFERENTIAL
HCT: 23 — AB (ref 36–46)
Hemoglobin: 7.6 — AB (ref 12.0–16.0)
Neutrophils Absolute: 3.16
Platelets: 146 — AB (ref 150–399)
WBC: 4.1

## 2021-02-19 LAB — CBC
MCV: 111 — AB (ref 81–99)
RBC: 2.05 — AB (ref 3.87–5.11)

## 2021-02-19 LAB — PREPARE RBC (CROSSMATCH)

## 2021-02-19 LAB — COMPREHENSIVE METABOLIC PANEL
Albumin: 3.7 (ref 3.5–5.0)
Calcium: 8.3 — AB (ref 8.7–10.7)

## 2021-02-19 LAB — HEPATIC FUNCTION PANEL
ALT: 34 (ref 7–35)
AST: 43 — AB (ref 13–35)
Alkaline Phosphatase: 137 — AB (ref 25–125)
Bilirubin, Total: 0.5

## 2021-02-19 MED ORDER — HEPARIN SOD (PORK) LOCK FLUSH 100 UNIT/ML IV SOLN: 250.0000 [IU] | INTRAVENOUS | Status: DC | PRN

## 2021-02-19 MED ORDER — SODIUM CHLORIDE 0.9 % IV SOLN: Freq: Once | INTRAVENOUS | Status: AC

## 2021-02-19 MED ORDER — PEGFILGRASTIM-JMDB 6 MG/0.6ML ~~LOC~~ SOSY
6.0000 mg | PREFILLED_SYRINGE | Freq: Once | SUBCUTANEOUS | Status: AC
  Administered 2021-02-19: 6 mg via SUBCUTANEOUS
  Filled 2021-02-19: qty 0.6

## 2021-02-19 MED ORDER — SODIUM CHLORIDE 0.9% FLUSH: 10.0000 mL | INTRAVENOUS | Status: DC | PRN

## 2021-02-19 MED ORDER — ONDANSETRON HCL 4 MG/2ML IJ SOLN
8.0000 mg | Freq: Once | INTRAMUSCULAR | Status: AC
  Administered 2021-02-19: 8 mg via INTRAVENOUS
  Filled 2021-02-19: qty 4

## 2021-02-19 MED ORDER — SODIUM CHLORIDE 0.9% IV SOLUTION: 250.0000 mL | Freq: Once | INTRAVENOUS | Status: DC

## 2021-02-19 NOTE — Progress Notes (Signed)
Changing premed to palonosetron for day 8 cycle 7 due to persistent nausea/vomiting despite ondansetron and olanzapine.

## 2021-02-19 NOTE — Progress Notes (Unsigned)
The patient presents to the infusion center for PEG filgrastim and is seen as she reports nausea and vomiting since chemotherapy on Monday, despite ondansetron ODT and olanzapine 10 mg at bedtime.  She also reports dizziness and fatigue. She has chronic diarrhea despite Sandostatin.  She receives Aloxi/Emend on day 1, but Emend/ Zofran on day 8.  Vitals reveal orthostatic hypotension. We will give her 1 L of IV fluids, as well as IV Zofran 8 mg, and repeat a CBC, CMP and magnesium.  We will also plan to add Aloxi in place of Zofran on day 8.  She otherwise would like to continue with her current at home antiemetics.  The patient was feeling better after 1 unit of IV normal saline and Zofran, however, her hemoglobin is 7.6.  We will arrange for her to have 1 unit of packed red blood cells as an outpatient tomorrow.

## 2021-02-19 NOTE — Patient Instructions (Signed)
Diarrhea, Adult Diarrhea is when you pass loose and watery poop (stool) often. Diarrhea can make you feel weak and cause you to lose water in your body (get dehydrated). Losing water in your body can cause you to: Feel tired and thirsty. Have a dry mouth. Go pee (urinate) less often. Diarrhea often lasts 2-3 days. However, it can last longer if it is a sign of something more serious. It is important to treat your diarrhea as told by your doctor. Follow these instructions at home: Eating and drinking   Follow these instructions as told by your doctor: Take an ORS (oral rehydration solution). This is a drink that helps you replace fluids and minerals your body lost. It is sold at pharmacies and stores. Drink plenty of fluids, such as: Water. Ice chips. Diluted fruit juice. Low-calorie sports drinks. Milk, if you want. Avoid drinking fluids that have a lot of sugar or caffeine in them. Eat bland, easy-to-digest foods in small amounts as you are able. These foods include: Bananas. Applesauce. Rice. Low-fat (lean) meats. Toast. Crackers. Avoid alcohol. Avoid spicy or fatty foods.  Medicines Take over-the-counter and prescription medicines only as told by your doctor. If you were prescribed an antibiotic medicine, take it as told by your doctor. Do not stop using the antibiotic even if you start to feel better. General instructions  Wash your hands often using soap and water. If soap and water are not available, use a hand sanitizer. Others in your home should wash their hands as well. Hands should be washed: After using the toilet or changing a diaper. Before preparing, cooking, or serving food. While caring for a sick person. While visiting someone in a hospital. Drink enough fluid to keep your pee (urine) pale yellow. Rest at home while you get better. Watch your condition for any changes. Take a warm bath to help with any burning or pain from having diarrhea. Keep all  follow-up visits as told by your doctor. This is important. Contact a doctor if: You have a fever. Your diarrhea gets worse. You have new symptoms. You cannot keep fluids down. You feel light-headed or dizzy. You have a headache. You have muscle cramps. Get help right away if: You have chest pain. You feel very weak or you pass out (faint). You have bloody or black poop or poop that looks like tar. You have very bad pain, cramping, or bloating in your belly (abdomen). You have trouble breathing or you are breathing very quickly. Your heart is beating very quickly. Your skin feels cold and clammy. You feel confused. You have signs of losing too much water in your body, such as: Dark pee, very little pee, or no pee. Cracked lips. Dry mouth. Sunken eyes. Sleepiness. Weakness. Summary Diarrhea is when you pass loose and watery poop (stool) often. Diarrhea can make you feel weak and cause you to lose water in your body (get dehydrated). Take an ORS (oral rehydration solution). This is a drink that is sold at pharmacies and stores. Eat bland, easy-to-digest foods in small amounts as you are able. Contact a doctor if your condition gets worse. Get help right away if you have signs that you have lost too much water in your body. This information is not intended to replace advice given to you by your health care provider. Make sure you discuss any questions you have with your health care provider. Document Revised: 11/11/2017 Document Reviewed: 11/11/2017 Elsevier Patient Education  [place]. Pegfilgrastim injection What is  this medication? PEGFILGRASTIM (PEG fil gra stim) is a long-acting granulocyte colony-stimulating factor that stimulates the growth of neutrophils, a type of white blood cell important in the body's fight against infection. It is used to reduce the incidence of fever and infection in patients with certain types of cancer who are receiving chemotherapy that  affects the bone marrow, and to increase survival after being exposed to high doses of radiation. This medicine may be used for other purposes; ask your health care provider or pharmacist if you have questions. COMMON BRAND NAME(S): Rexene Edison, Ziextenzo What should I tell my care team before I take this medication? They need to know if you have any of these conditions: kidney disease latex allergy ongoing radiation therapy sickle cell disease skin reactions to acrylic adhesives (On-Body Injector only) an unusual or allergic reaction to pegfilgrastim, filgrastim, other medicines, foods, dyes, or preservatives pregnant or trying to get pregnant breast-feeding How should I use this medication? This medicine is for injection under the skin. If you get this medicine at home, you will be taught how to prepare and give the pre-filled syringe or how to use the On-body Injector. Refer to the patient Instructions for Use for detailed instructions. Use exactly as directed. Tell your healthcare provider immediately if you suspect that the On-body Injector may not have performed as intended or if you suspect the use of the On-body Injector resulted in a missed or partial dose. It is important that you put your used needles and syringes in a special sharps container. Do not put them in a trash can. If you do not have a sharps container, call your pharmacist or healthcare provider to get one. Talk to your pediatrician regarding the use of this medicine in children. While this drug may be prescribed for selected conditions, precautions do apply. Overdosage: If you think you have taken too much of this medicine contact a poison control center or emergency room at once. NOTE: This medicine is only for you. Do not share this medicine with others. What if I miss a dose? It is important not to miss your dose. Call your doctor or health care professional if you miss your dose. If you miss a  dose due to an On-body Injector failure or leakage, a new dose should be administered as soon as possible using a single prefilled syringe for manual use. What may interact with this medication? Interactions have not been studied. This list may not describe all possible interactions. Give your health care provider a list of all the medicines, herbs, non-prescription drugs, or dietary supplements you use. Also tell them if you smoke, drink alcohol, or use illegal drugs. Some items may interact with your medicine. What should I watch for while using this medication? Your condition will be monitored carefully while you are receiving this medicine. You may need blood work done while you are taking this medicine. Talk to your health care provider about your risk of cancer. You may be more at risk for certain types of cancer if you take this medicine. If you are going to need a MRI, CT scan, or other procedure, tell your doctor that you are using this medicine (On-Body Injector only). What side effects may I notice from receiving this medication? Side effects that you should report to your doctor or health care professional as soon as possible: allergic reactions (skin rash, itching or hives, swelling of the face, lips, or tongue) back pain dizziness fever pain, redness, or irritation  at site where injected pinpoint red spots on the skin red or dark-brown urine shortness of breath or breathing problems stomach or side pain, or pain at the shoulder swelling tiredness trouble passing urine or change in the amount of urine unusual bruising or bleeding Side effects that usually do not require medical attention (report to your doctor or health care professional if they continue or are bothersome): bone pain muscle pain This list may not describe all possible side effects. Call your doctor for medical advice about side effects. You may report side effects to FDA at 1-800-FDA-1088. Where should I keep  my medication? Keep out of the reach of children. If you are using this medicine at home, you will be instructed on how to store it. Throw away any unused medicine after the expiration date on the label. NOTE: This sheet is a summary. It may not cover all possible information. If you have questions about this medicine, talk to your doctor, pharmacist, or health care provider.  2022 Elsevier/Gold Standard (2020-07-04 11:54:14) Dehydration, Adult Dehydration is condition in which there is not enough water or other fluids in the body. This happens when a person loses more fluids than he or she takes in. Important body parts cannot work right without the right amount of fluids. Any loss of fluids from the body can cause dehydration. Dehydration can be mild, worse, or very bad. It should be treated right away to keep it from getting very bad. What are the causes? This condition may be caused by: Conditions that cause loss of water or other fluids, such as: Watery poop (diarrhea). Vomiting. Sweating a lot. Peeing (urinating) a lot. Not drinking enough fluids, especially when you: Are ill. Are doing things that take a lot of energy to do. Other illnesses and conditions, such as fever or infection. Certain medicines, such as medicines that take extra fluid out of the body (diuretics). Lack of safe drinking water. Not being able to get enough water and food. What increases the risk? The following factors may make you more likely to develop this condition: Having a long-term (chronic) illness that has not been treated the right way, such as: Diabetes. Heart disease. Kidney disease. Being 9 years of age or older. Having a disability. Living in a place that is high above the ground or sea (high in altitude). The thinner, dried air causes more fluid loss. Doing exercises that put stress on your body for a long time. What are the signs or symptoms? Symptoms of dehydration depend on how bad it  is. Mild or worse dehydration Thirst. Dry lips or dry mouth. Feeling dizzy or light-headed, especially when you stand up from sitting. Muscle cramps. Your body making: Dark pee (urine). Pee may be the color of tea. Less pee than normal. Less tears than normal. Headache. Very bad dehydration Changes in skin. Skin may: Be cold to the touch (clammy). Be blotchy or pale. Not go back to normal right after you lightly pinch it and let it go. Little or no tears, pee, or sweat. Changes in vital signs, such as: Fast breathing. Low blood pressure. Weak pulse. Pulse that is more than 100 beats a minute when you are sitting still. Other changes, such as: Feeling very thirsty. Eyes that look hollow (sunken). Cold hands and feet. Being mixed up (confused). Being very tired (lethargic) or having trouble waking from sleep. Short-term weight loss. Loss of consciousness. How is this treated? Treatment for this condition depends on how bad it is.  Treatment should start right away. Do not wait until your condition gets very bad. Very bad dehydration is an emergency. You will need to go to a hospital. Mild or worse dehydration can be treated at home. You may be asked to: Drink more fluids. Drink an oral rehydration solution (ORS). This drink helps get the right amounts of fluids and salts and minerals in the blood (electrolytes). Very bad dehydration can be treated: With fluids through an IV tube. By getting normal levels of salts and minerals in your blood. This is often done by giving salts and minerals through a tube. The tube is passed through your nose and into your stomach. By treating the root cause. Follow these instructions at home: Oral rehydration solution If told by your doctor, drink an ORS: Make an ORS. Use instructions on the package. Start by drinking small amounts, about  cup (120 mL) every 5-10 minutes. Slowly drink more until you have had the amount that your doctor said to  have. Eating and drinking     Drink enough clear fluid to keep your pee pale yellow. If you were told to drink an ORS, finish the ORS first. Then, start slowly drinking other clear fluids. Drink fluids such as: Water. Do not drink only water. Doing that can make the salt (sodium) level in your body get too low. Water from ice chips you suck on. Fruit juice that you have added water to (diluted). Low-calorie sports drinks. Eat foods that have the right amounts of salts and minerals, such as: Bananas. Oranges. Potatoes. Tomatoes. Spinach. Do not drink alcohol. Avoid: Drinks that have a lot of sugar. These include: High-calorie sports drinks. Fruit juice that you did not add water to. Soda. Caffeine. Foods that are greasy or have a lot of fat or sugar. General instructions Take over-the-counter and prescription medicines only as told by your doctor. Do not take salt tablets. Doing that can make the salt level in your body get too high. Return to your normal activities as told by your doctor. Ask your doctor what activities are safe for you. Keep all follow-up visits as told by your doctor. This is important. Contact a doctor if: You have pain in your belly (abdomen) and the pain: Gets worse. Stays in one place. You have a rash. You have a stiff neck. You get angry or annoyed (irritable) more easily than normal. You are more tired or have a harder time waking than normal. You feel: Weak or dizzy. Very thirsty. Get help right away if you have: Any symptoms of very bad dehydration. Symptoms of vomiting, such as: You cannot eat or drink without vomiting. Your vomiting gets worse or does not go away. Your vomit has blood or green stuff in it. Symptoms that get worse with treatment. A fever. A very bad headache. Problems with peeing or pooping (having a bowel movement), such as: Watery poop that gets worse or does not go away. Blood in your poop (stool). This may cause poop  to look black and tarry. Not peeing in 6-8 hours. Peeing only a small amount of very dark pee in 6-8 hours. Trouble breathing. These symptoms may be an emergency. Do not wait to see if the symptoms will go away. Get medical help right away. Call your local emergency services (911 in the [place] Do not drive yourself to the hospital. Summary Dehydration is a condition in which there is not enough water or other fluids in the body. This happens when a person  loses more fluids than he or she takes in. Treatment for this condition depends on how bad it is. Treatment should be started right away. Do not wait until your condition gets very bad. Drink enough clear fluid to keep your pee pale yellow. If you were told to drink an oral rehydration solution (ORS), finish the ORS first. Then, start slowly drinking other clear fluids. Take over-the-counter and prescription medicines only as told by your doctor. Get help right away if you have any symptoms of very bad dehydration. This information is not intended to replace advice given to you by your health care provider. Make sure you discuss any questions you have with your health care provider. Document Revised: 01/18/2019 Document Reviewed: 01/18/2019 Elsevier Patient Education  [place].

## 2021-02-19 NOTE — Progress Notes (Signed)
[facility]  [address] [place],  [place]  [address] [phone]  Clinic Day:  02/28/2021  Referring physician: Greig Right, MD  This document serves as a record of services personally performed by Dequincy Macarthur Critchley, MD. It was created on their behalf by [facility], a trained medical scribe. The creation of this record is based on the scribe's personal observations and the provider's statements to them.  HISTORY OF PRESENT ILLNESS:  The patient is a 60 y.o. female with metastatic hormone receptor positive breast cancer, which includes spread of disease to her stomach.  She comes in today to be evaluated before heading into her 7th cycle of carboplatin/gemcitabine.  The patient claims to have tolerated her 6th cycle of chemotherapy fairly well.  However, she has had intermittent abdominal pain and nausea, for which she has been more reliant upon pain and antinausea medication recently.  She also admits to feeling weaker.  Otherwise, she denies having any new symptoms or findings which concern her for disease progression.  VITALS:  Blood pressure (!) 99/59, pulse 82, temperature (!) 97.2 F (36.2 C), temperature source Oral, resp. rate 16, height 5' 5.5" (1.664 m), weight 124 lb 8 oz (56.5 kg), SpO2 99 %.  Wt Readings from Last 3 Encounters:  02/27/21 126 lb 4 oz (57.3 kg)  02/27/21 124 lb 8 oz (56.5 kg)  02/20/21 125 lb 12 oz (57 kg)    Body mass index is 20.4 kg/m.  Performance status (ECOG): 0 - Asymptomatic  PHYSICAL EXAM:  Physical Exam Constitutional:      General: She is not in acute distress.    Appearance: Normal appearance. She is normal weight.  HENT:     Head: Normocephalic and atraumatic.  Eyes:     General: No scleral icterus.    Extraocular Movements: Extraocular movements intact.     Conjunctiva/sclera: Conjunctivae normal.     Pupils: Pupils are equal, round, and reactive to light.  Cardiovascular:     Rate and Rhythm:  Normal rate and regular rhythm.     Pulses: Normal pulses.     Heart sounds: Normal heart sounds. No murmur heard.   No friction rub. No gallop.  Pulmonary:     Effort: Pulmonary effort is normal. No respiratory distress.     Breath sounds: Normal breath sounds.  Abdominal:     General: Bowel sounds are normal. There is no distension.     Palpations: Abdomen is soft. There is no hepatomegaly, splenomegaly or mass.     Tenderness: There is no abdominal tenderness.  Musculoskeletal:        General: Normal range of motion.     Cervical back: Normal range of motion and neck supple.     Right lower leg: No edema.     Left lower leg: No edema.  Lymphadenopathy:     Cervical: No cervical adenopathy.  Skin:    General: Skin is warm and dry.  Neurological:     General: No focal deficit present.     Mental Status: She is alert and oriented to person, place, and time. Mental status is at baseline.  Psychiatric:        Mood and Affect: Mood normal.        Behavior: Behavior normal.        Thought Content: Thought content normal.        Judgment: Judgment normal.    LABS:   CBC Latest Ref Rng & Units 02/27/2021  02/19/2021 02/09/2021  WBC - 11.6 4.1 6.5  Hemoglobin 12.0 - 16.0 9.4(A) 7.6(A) 9.0(A)  Hematocrit 36 - 46 29(A) 23(A) 28(A)  Platelets 150 - 399 34(A) 146(A) 242   CMP Latest Ref Rng & Units 02/27/2021 02/19/2021 02/09/2021  BUN 4 - 21 25(A) 28(A) 20  Creatinine 0.5 - 1.1 1.0 0.9 1.0  Sodium 137 - 147 138 137 140  Potassium 3.4 - 5.3 3.4 3.6 4.1  Chloride 99 - 108 100 102 107  CO2 13 - 22 27(A) 27(A) 26(A)  Calcium 8.7 - 10.7 8.1(A) 8.3(A) 8.8  Total Protein 6.3 - 8.2 g/dL - - -  Alkaline Phos 25 - 125 178(A) 137(A) 136(A)  AST 13 - 35 30 43(A) 33  ALT 7 - 35 22 34 13    ASSESSMENT & PLAN:  Assessment/Plan:  A 60 y.o. female with metastatic hormone positive breast cancer.  Due to her low platelets, her 7th cycle of carboplatin/gemcitabine will be delayed for one week.  Moving  forward, her cycles of chemotherapy will be spaced out to every 4 weeks, with her to be on a day 1 and 15 regimen.  As her hemoglobin is less than 10, I will begin giving her Retacrit with each cycle of chemotherapy, which will hopefully decrease her degree of fatigue.  I will see her back in approximately 1 month before she heads into her 8th cycle of carboplatin/gemcitabine. The patient understands the plans discussed today and knows to contact our office if she runs into any problems that require immediate clinical attention.  I, Rita Ohara, am acting as scribe for Marice Potter, MD    I have reviewed this report as typed by the medical scribe, and it is complete and accurate.  Dequincy Macarthur Critchley, MD

## 2021-02-20 ENCOUNTER — Inpatient Hospital Stay: Payer: PPO

## 2021-02-20 ENCOUNTER — Other Ambulatory Visit: Payer: Self-pay | Admitting: Hematology and Oncology

## 2021-02-20 VITALS — BP 120/63 | HR 75 | Temp 98.2°F | Resp 18 | Ht 65.5 in | Wt 125.8 lb

## 2021-02-20 DIAGNOSIS — D6481 Anemia due to antineoplastic chemotherapy: Secondary | ICD-10-CM

## 2021-02-20 DIAGNOSIS — T451X5A Adverse effect of antineoplastic and immunosuppressive drugs, initial encounter: Secondary | ICD-10-CM

## 2021-02-20 DIAGNOSIS — Z5111 Encounter for antineoplastic chemotherapy: Secondary | ICD-10-CM | POA: Diagnosis not present

## 2021-02-20 DIAGNOSIS — R197 Diarrhea, unspecified: Secondary | ICD-10-CM

## 2021-02-20 MED ORDER — DIPHENHYDRAMINE HCL 25 MG PO CAPS
25.0000 mg | ORAL_CAPSULE | Freq: Four times a day (QID) | ORAL | Status: DC | PRN
  Administered 2021-02-20: 25 mg via ORAL
  Filled 2021-02-20: qty 1

## 2021-02-20 MED ORDER — HEPARIN SOD (PORK) LOCK FLUSH 100 UNIT/ML IV SOLN
500.0000 [IU] | Freq: Every day | INTRAVENOUS | Status: AC | PRN
  Administered 2021-02-20: 500 [IU]

## 2021-02-20 MED ORDER — SODIUM CHLORIDE 0.9% FLUSH
10.0000 mL | INTRAVENOUS | Status: AC | PRN
  Administered 2021-02-20: 10 mL

## 2021-02-20 MED ORDER — SODIUM CHLORIDE 0.9% FLUSH: 3.0000 mL | INTRAVENOUS | Status: DC | PRN

## 2021-02-20 MED ORDER — ACETAMINOPHEN 325 MG PO TABS
650.0000 mg | ORAL_TABLET | Freq: Four times a day (QID) | ORAL | Status: DC | PRN
  Administered 2021-02-20: 650 mg via ORAL
  Filled 2021-02-20: qty 2

## 2021-02-20 MED ORDER — SODIUM CHLORIDE 0.9% IV SOLUTION
250.0000 mL | Freq: Once | INTRAVENOUS | Status: AC
  Administered 2021-02-20: 250 mL via INTRAVENOUS

## 2021-02-20 NOTE — Progress Notes (Signed)
1616:PT STABLE AT TIME OF DISCHARGE °

## 2021-02-20 NOTE — Patient Instructions (Signed)
Blood Transfusion, Adult, Care After This sheet gives you information about how to care for yourself after your procedure. Your doctor may also give you more specific instructions. If you have problems or questions, contact your doctor. What can I expect after the procedure? After the procedure, it is common to have: Bruising and soreness at the IV site. A headache. Follow these instructions at home: Insertion site care   Follow instructions from your doctor about how to take care of your insertion site. This is where an IV tube was put into your vein. Make sure you: Wash your hands with soap and water before and after you change your bandage (dressing). If you cannot use soap and water, use hand sanitizer. Change your bandage as told by your doctor. Check your insertion site every day for signs of infection. Check for: Redness, swelling, or pain. Bleeding from the site. Warmth. Pus or a bad smell. General instructions Take over-the-counter and prescription medicines only as told by your doctor. Rest as told by your doctor. Go back to your normal activities as told by your doctor. Keep all follow-up visits as told by your doctor. This is important. Contact a doctor if: You have itching or red, swollen areas of skin (hives). You feel worried or nervous (anxious). You feel weak after doing your normal activities. You have redness, swelling, warmth, or pain around the insertion site. You have blood coming from the insertion site, and the blood does not stop with pressure. You have pus or a bad smell coming from the insertion site. Get help right away if: You have signs of a serious reaction. This may be coming from an allergy or the body's defense system (immune system). Signs include: Trouble breathing or shortness of breath. Swelling of the face or feeling warm (flushed). Fever or chills. Head, chest, or back pain. Dark pee (urine) or blood in the pee. Widespread rash. Fast  heartbeat. Feeling dizzy or light-headed. You may receive your blood transfusion in an outpatient setting. If so, you will be told whom to contact to report any reactions. These symptoms may be an emergency. Do not wait to see if the symptoms will go away. Get medical help right away. Call your local emergency services (911 in the [place] Do not drive yourself to the hospital. Summary Bruising and soreness at the IV site are common. Check your insertion site every day for signs of infection. Rest as told by your doctor. Go back to your normal activities as told by your doctor. Get help right away if you have signs of a serious reaction. This information is not intended to replace advice given to you by your health care provider. Make sure you discuss any questions you have with your health care provider. Document Revised: 10/02/2020 Document Reviewed: 11/30/2018 Elsevier Patient Education  2022 [employer]vier Inc.  

## 2021-02-23 LAB — TYPE AND SCREEN
ABO/RH(D): A POS
Antibody Screen: NEGATIVE
Unit division: 0

## 2021-02-23 LAB — BPAM RBC
Blood Product Expiration Date: 202209252359
ISSUE DATE / TIME: 202209021208
Unit Type and Rh: 6200

## 2021-02-25 ENCOUNTER — Encounter: Payer: Self-pay | Admitting: Oncology

## 2021-02-26 ENCOUNTER — Other Ambulatory Visit: Payer: Self-pay | Admitting: Hematology and Oncology

## 2021-02-26 DIAGNOSIS — R609 Edema, unspecified: Secondary | ICD-10-CM

## 2021-02-26 MED ORDER — FUROSEMIDE 20 MG PO TABS: 20.0000 mg | ORAL_TABLET | Freq: Every day | ORAL | 0 refills | Status: DC | PRN

## 2021-02-27 ENCOUNTER — Inpatient Hospital Stay (INDEPENDENT_AMBULATORY_CARE_PROVIDER_SITE_OTHER): Payer: PPO | Admitting: Oncology

## 2021-02-27 ENCOUNTER — Inpatient Hospital Stay: Payer: PPO

## 2021-02-27 ENCOUNTER — Other Ambulatory Visit: Payer: Self-pay | Admitting: Hematology and Oncology

## 2021-02-27 ENCOUNTER — Other Ambulatory Visit: Payer: Self-pay

## 2021-02-27 VITALS — BP 99/59 | HR 82 | Temp 97.2°F | Resp 16 | Ht 65.5 in | Wt 124.5 lb

## 2021-02-27 VITALS — BP 115/61 | HR 88 | Temp 98.0°F | Resp 18 | Ht 65.5 in | Wt 126.2 lb

## 2021-02-27 DIAGNOSIS — Z5111 Encounter for antineoplastic chemotherapy: Secondary | ICD-10-CM | POA: Diagnosis not present

## 2021-02-27 DIAGNOSIS — C7889 Secondary malignant neoplasm of other digestive organs: Secondary | ICD-10-CM

## 2021-02-27 DIAGNOSIS — C50111 Malignant neoplasm of central portion of right female breast: Secondary | ICD-10-CM

## 2021-02-27 DIAGNOSIS — R197 Diarrhea, unspecified: Secondary | ICD-10-CM

## 2021-02-27 DIAGNOSIS — Z170 Estrogen receptor positive status [ER+]: Secondary | ICD-10-CM

## 2021-02-27 DIAGNOSIS — C785 Secondary malignant neoplasm of large intestine and rectum: Secondary | ICD-10-CM

## 2021-02-27 LAB — HEPATIC FUNCTION PANEL
ALT: 22 (ref 7–35)
AST: 30 (ref 13–35)
Alkaline Phosphatase: 178 — AB (ref 25–125)
Bilirubin, Total: 0.4

## 2021-02-27 LAB — BASIC METABOLIC PANEL
BUN: 25 — AB (ref 4–21)
CO2: 27 — AB (ref 13–22)
Chloride: 100 (ref 99–108)
Creatinine: 1 (ref 0.5–1.1)
Glucose: 123
Potassium: 3.4 (ref 3.4–5.3)
Sodium: 138 (ref 137–147)

## 2021-02-27 LAB — CBC: RBC: 2.71 — AB (ref 3.87–5.11)

## 2021-02-27 LAB — CBC AND DIFFERENTIAL
HCT: 29 — AB (ref 36–46)
Hemoglobin: 9.4 — AB (ref 12.0–16.0)
Neutrophils Absolute: 8.7
Platelets: 34 — AB (ref 150–399)
WBC: 11.6

## 2021-02-27 LAB — COMPREHENSIVE METABOLIC PANEL
Albumin: 3.5 (ref 3.5–5.0)
Calcium: 8.1 — AB (ref 8.7–10.7)

## 2021-02-27 MED ORDER — OCTREOTIDE ACETATE 30 MG IM KIT
30.0000 mg | PACK | Freq: Once | INTRAMUSCULAR | Status: AC
  Administered 2021-02-27: 30 mg via INTRAMUSCULAR
  Filled 2021-02-27: qty 1

## 2021-02-27 MED ORDER — ONDANSETRON HCL 4 MG/2ML IJ SOLN
INTRAMUSCULAR | Status: AC
  Administered 2021-02-27: 8 mg via INTRAVENOUS
  Filled 2021-02-27: qty 4

## 2021-02-27 MED ORDER — POTASSIUM CHLORIDE 10 MEQ/100ML IV SOLN
10.0000 meq | INTRAVENOUS | Status: AC
  Administered 2021-02-27 (×2): 10 meq via INTRAVENOUS
  Filled 2021-02-27 (×2): qty 100

## 2021-02-27 MED ORDER — ALTEPLASE 2 MG IJ SOLR: 2.0000 mg | Freq: Once | INTRAMUSCULAR | Status: DC | PRN

## 2021-02-27 MED ORDER — HEPARIN SOD (PORK) LOCK FLUSH 100 UNIT/ML IV SOLN: 250.0000 [IU] | Freq: Once | INTRAVENOUS | Status: DC | PRN

## 2021-02-27 MED ORDER — MAGNESIUM SULFATE 4 GM/100ML IV SOLN
4.0000 g | Freq: Once | INTRAVENOUS | Status: AC
  Administered 2021-02-27: 4 g via INTRAVENOUS
  Filled 2021-02-27: qty 100

## 2021-02-27 MED ORDER — SODIUM CHLORIDE 0.9 % IV SOLN: Freq: Once | INTRAVENOUS | Status: AC

## 2021-02-27 MED ORDER — HEPARIN SOD (PORK) LOCK FLUSH 100 UNIT/ML IV SOLN
500.0000 [IU] | Freq: Once | INTRAVENOUS | Status: AC | PRN
  Administered 2021-02-27: 500 [IU]

## 2021-02-27 MED ORDER — SODIUM CHLORIDE 0.9% FLUSH
3.0000 mL | Freq: Once | INTRAVENOUS | Status: DC | PRN
Start: 2021-02-27 — End: 2021-02-27

## 2021-02-27 MED ORDER — SODIUM CHLORIDE 0.9 % IV SOLN: 8.0000 mg | Freq: Once | INTRAVENOUS | Status: DC

## 2021-02-27 MED ORDER — SODIUM CHLORIDE 0.9% FLUSH
10.0000 mL | Freq: Once | INTRAVENOUS | Status: AC | PRN
  Administered 2021-02-27: 10 mL

## 2021-02-27 NOTE — Addendum Note (Signed)
Addended by: Juanetta Beets on: 02/27/2021 01:11 PM   Modules accepted: Orders

## 2021-02-27 NOTE — Addendum Note (Signed)
Addended by: Juanetta Beets on: 02/27/2021 10:42 AM   Modules accepted: Orders

## 2021-02-27 NOTE — Patient Instructions (Signed)
Hypomagnesemia Hypomagnesemia is a condition in which the level of magnesium in the blood is low. Magnesium is a mineral that is found in many foods. It is used in many different processes in the body. Hypomagnesemia can affect every organ in the body. In severe cases, it can cause life-threatening problems. What are the causes? This condition may be caused by: Not getting enough magnesium in your diet. Malnutrition. Problems with absorbing magnesium from the intestines. Dehydration. Alcohol abuse. Vomiting. Severe or chronic diarrhea. Some medicines, including medicines that make you urinate more (diuretics). Certain diseases, such as kidney disease, diabetes, celiac disease, and overactive thyroid. What are the signs or symptoms? Symptoms of this condition include: Loss of appetite. Nausea and vomiting. Involuntary shaking or trembling of a body part (tremor). Muscle weakness. Tingling in the arms and legs. Sudden tightening of muscles (muscle spasms). Confusion. Psychiatric issues, such as depression, irritability, or psychosis. A feeling of fluttering of the heart. Seizures. These symptoms are more severe if magnesium levels drop suddenly. How is this diagnosed? This condition may be diagnosed based on: Your symptoms and medical history. A physical exam. Blood and urine tests. How is this treated? Treatment depends on the cause and the severity of the condition. It may be treated with: A magnesium supplement. This can be taken in pill form. If the condition is severe, magnesium is usually given through an IV. Changes to your diet. You may be directed to eat foods that have a lot of magnesium, such as green leafy vegetables, peas, beans, and nuts. Stopping any intake of alcohol. Follow these instructions at home:   Make sure that your diet includes foods with magnesium. Foods that have a lot of magnesium in them include: Green leafy vegetables, such as spinach and  broccoli. Beans and peas. Nuts and seeds, such as almonds and sunflower seeds. Whole grains, such as whole grain bread and fortified cereals. Take magnesium supplements if your health care provider tells you to do that. Take them as directed. Take over-the-counter and prescription medicines only as told by your health care provider. Have your magnesium levels monitored as told by your health care provider. When you are active, drink fluids that contain electrolytes. Avoid drinking alcohol. Keep all follow-up visits as told by your health care provider. This is important. Contact a health care provider if: You get worse instead of better. Your symptoms return. Get help right away if you: Develop severe muscle weakness. Have trouble breathing. Feel that your heart is racing. Summary Hypomagnesemia is a condition in which the level of magnesium in the blood is low. Hypomagnesemia can affect every organ in the body. Treatment may include eating more foods that contain magnesium, taking magnesium supplements, and not drinking alcohol. Have your magnesium levels monitored as told by your health care provider. This information is not intended to replace advice given to you by your health care provider. Make sure you discuss any questions you have with your health care provider. Document Revised: 08/20/2020 Document Reviewed: 11/08/2019 Elsevier Patient Education  [place]. Hypokalemia Hypokalemia means that the amount of potassium in the blood is lower than normal. Potassium is a chemical (electrolyte) that helps regulate the amount of fluid in the body. It also stimulates muscle tightening (contraction) and helps nerves work properly. Normally, most of the body's potassium is inside cells, and only a very small amount is in the blood. Because the amount in the blood is so small, minor changes to potassium levels in the blood  can be life-threatening. What are the causes? This  condition may be caused by: Antibiotic medicine. Diarrhea or vomiting. Taking too much of a medicine that helps you have a bowel movement (laxative) can cause diarrhea and lead to hypokalemia. Chronic kidney disease (CKD). Medicines that help the body get rid of excess fluid (diuretics). Eating disorders, such as bulimia. Low magnesium levels in the body. Sweating a lot. What are the signs or symptoms? Symptoms of this condition include: Weakness. Constipation. Fatigue. Muscle cramps. Mental confusion. Skipped heartbeats or irregular heartbeat (palpitations). Tingling or numbness. How is this diagnosed? This condition is diagnosed with a blood test. How is this treated? This condition may be treated by: Taking potassium supplements by mouth. Adjusting the medicines that you take. Eating more foods that contain a lot of potassium. If your potassium level is very low, you may need to get potassium through an IV and be monitored in the hospital. Follow these instructions at home:  Take over-the-counter and prescription medicines only as told by your health care provider. This includes vitamins and supplements. Eat a healthy diet. A healthy diet includes fresh fruits and vegetables, whole grains, healthy fats, and lean proteins. If instructed, eat more foods that contain a lot of potassium. This includes: Nuts, such as peanuts and pistachios. Seeds, such as sunflower seeds and pumpkin seeds. Peas, lentils, and lima beans. Whole grain and bran cereals and breads. Fresh fruits and vegetables, such as apricots, avocado, bananas, cantaloupe, kiwi, oranges, tomatoes, asparagus, and potatoes. Orange juice. Tomato juice. Red meats. Yogurt. Keep all follow-up visits as told by your health care provider. This is important. Contact a health care provider if you: Have weakness that gets worse. Feel your heart pounding or racing. Vomit. Have diarrhea. Have diabetes (diabetes mellitus)  and you have trouble keeping your blood sugar (glucose) in your target range. Get help right away if you: Have chest pain. Have shortness of breath. Have vomiting or diarrhea that lasts for more than 2 days. Faint. Summary Hypokalemia means that the amount of potassium in the blood is lower than normal. This condition is diagnosed with a blood test. Hypokalemia may be treated by taking potassium supplements, adjusting the medicines that you take, or eating more foods that are high in potassium. If your potassium level is very low, you may need to get potassium through an IV and be monitored in the hospital. This information is not intended to replace advice given to you by your health care provider. Make sure you discuss any questions you have with your health care provider. Document Revised: 01/17/2018 Document Reviewed: 01/18/2018 Elsevier Patient Education  2022 [place]. Dehydration, Adult Dehydration is condition in which there is not enough water or other fluids in the body. This happens when a person loses more fluids than he or she takes in. Important body parts cannot work right without the right amount of fluids. Any loss of fluids from the body can cause dehydration. Dehydration can be mild, worse, or very bad. It should be treated right away to keep it from getting very bad. What are the causes? This condition may be caused by: Conditions that cause loss of water or other fluids, such as: Watery poop (diarrhea). Vomiting. Sweating a lot. Peeing (urinating) a lot. Not drinking enough fluids, especially when you: Are ill. Are doing things that take a lot of energy to do. Other illnesses and conditions, such as fever or infection. Certain medicines, such as medicines that take extra fluid out of  the body (diuretics). Lack of safe drinking water. Not being able to get enough water and food. What increases the risk? The following factors may make you more likely to develop  this condition: Having a long-term (chronic) illness that has not been treated the right way, such as: Diabetes. Heart disease. Kidney disease. Being 45 years of age or older. Having a disability. Living in a place that is high above the ground or sea (high in altitude). The thinner, dried air causes more fluid loss. Doing exercises that put stress on your body for a long time. What are the signs or symptoms? Symptoms of dehydration depend on how bad it is. Mild or worse dehydration Thirst. Dry lips or dry mouth. Feeling dizzy or light-headed, especially when you stand up from sitting. Muscle cramps. Your body making: Dark pee (urine). Pee may be the color of tea. Less pee than normal. Less tears than normal. Headache. Very bad dehydration Changes in skin. Skin may: Be cold to the touch (clammy). Be blotchy or pale. Not go back to normal right after you lightly pinch it and let it go. Little or no tears, pee, or sweat. Changes in vital signs, such as: Fast breathing. Low blood pressure. Weak pulse. Pulse that is more than 100 beats a minute when you are sitting still. Other changes, such as: Feeling very thirsty. Eyes that look hollow (sunken). Cold hands and feet. Being mixed up (confused). Being very tired (lethargic) or having trouble waking from sleep. Short-term weight loss. Loss of consciousness. How is this treated? Treatment for this condition depends on how bad it is. Treatment should start right away. Do not wait until your condition gets very bad. Very bad dehydration is an emergency. You will need to go to a hospital. Mild or worse dehydration can be treated at home. You may be asked to: Drink more fluids. Drink an oral rehydration solution (ORS). This drink helps get the right amounts of fluids and salts and minerals in the blood (electrolytes). Very bad dehydration can be treated: With fluids through an IV tube. By getting normal levels of salts and  minerals in your blood. This is often done by giving salts and minerals through a tube. The tube is passed through your nose and into your stomach. By treating the root cause. Follow these instructions at home: Oral rehydration solution If told by your doctor, drink an ORS: Make an ORS. Use instructions on the package. Start by drinking small amounts, about  cup (120 mL) every 5-10 minutes. Slowly drink more until you have had the amount that your doctor said to have. Eating and drinking     Drink enough clear fluid to keep your pee pale yellow. If you were told to drink an ORS, finish the ORS first. Then, start slowly drinking other clear fluids. Drink fluids such as: Water. Do not drink only water. Doing that can make the salt (sodium) level in your body get too low. Water from ice chips you suck on. Fruit juice that you have added water to (diluted). Low-calorie sports drinks. Eat foods that have the right amounts of salts and minerals, such as: Bananas. Oranges. Potatoes. Tomatoes. Spinach. Do not drink alcohol. Avoid: Drinks that have a lot of sugar. These include: High-calorie sports drinks. Fruit juice that you did not add water to. Soda. Caffeine. Foods that are greasy or have a lot of fat or sugar. General instructions Take over-the-counter and prescription medicines only as told by your doctor. Do not  take salt tablets. Doing that can make the salt level in your body get too high. Return to your normal activities as told by your doctor. Ask your doctor what activities are safe for you. Keep all follow-up visits as told by your doctor. This is important. Contact a doctor if: You have pain in your belly (abdomen) and the pain: Gets worse. Stays in one place. You have a rash. You have a stiff neck. You get angry or annoyed (irritable) more easily than normal. You are more tired or have a harder time waking than normal. You feel: Weak or dizzy. Very thirsty. Get  help right away if you have: Any symptoms of very bad dehydration. Symptoms of vomiting, such as: You cannot eat or drink without vomiting. Your vomiting gets worse or does not go away. Your vomit has blood or green stuff in it. Symptoms that get worse with treatment. A fever. A very bad headache. Problems with peeing or pooping (having a bowel movement), such as: Watery poop that gets worse or does not go away. Blood in your poop (stool). This may cause poop to look black and tarry. Not peeing in 6-8 hours. Peeing only a small amount of very dark pee in 6-8 hours. Trouble breathing. These symptoms may be an emergency. Do not wait to see if the symptoms will go away. Get medical help right away. Call your local emergency services (911 in the [place] Do not drive yourself to the hospital. Summary Dehydration is a condition in which there is not enough water or other fluids in the body. This happens when a person loses more fluids than he or she takes in. Treatment for this condition depends on how bad it is. Treatment should be started right away. Do not wait until your condition gets very bad. Drink enough clear fluid to keep your pee pale yellow. If you were told to drink an oral rehydration solution (ORS), finish the ORS first. Then, start slowly drinking other clear fluids. Take over-the-counter and prescription medicines only as told by your doctor. Get help right away if you have any symptoms of very bad dehydration. This information is not intended to replace advice given to you by your health care provider. Make sure you discuss any questions you have with your health care provider. Document Revised: 01/18/2019 Document Reviewed: 01/18/2019 Elsevier Patient Education  [place].

## 2021-02-27 NOTE — Addendum Note (Signed)
Addended by: Juanetta Beets on: 02/27/2021 10:09 AM   Modules accepted: Orders

## 2021-02-27 NOTE — Progress Notes (Signed)
1339: PT STABLE AT TIME OF DISCHARGE

## 2021-02-28 ENCOUNTER — Encounter: Payer: Self-pay | Admitting: Oncology

## 2021-03-02 ENCOUNTER — Inpatient Hospital Stay: Payer: PPO

## 2021-03-06 ENCOUNTER — Other Ambulatory Visit: Payer: Self-pay | Admitting: Hematology and Oncology

## 2021-03-06 ENCOUNTER — Other Ambulatory Visit: Payer: Self-pay

## 2021-03-06 ENCOUNTER — Encounter: Payer: Self-pay | Admitting: Oncology

## 2021-03-06 ENCOUNTER — Telehealth: Payer: Self-pay | Admitting: Oncology

## 2021-03-06 ENCOUNTER — Other Ambulatory Visit: Payer: Self-pay | Admitting: Oncology

## 2021-03-06 ENCOUNTER — Inpatient Hospital Stay: Payer: PPO

## 2021-03-06 DIAGNOSIS — C50111 Malignant neoplasm of central portion of right female breast: Secondary | ICD-10-CM

## 2021-03-06 DIAGNOSIS — T50905A Adverse effect of unspecified drugs, medicaments and biological substances, initial encounter: Secondary | ICD-10-CM

## 2021-03-06 DIAGNOSIS — C7889 Secondary malignant neoplasm of other digestive organs: Secondary | ICD-10-CM

## 2021-03-06 DIAGNOSIS — E876 Hypokalemia: Secondary | ICD-10-CM

## 2021-03-06 DIAGNOSIS — C785 Secondary malignant neoplasm of large intestine and rectum: Secondary | ICD-10-CM

## 2021-03-06 DIAGNOSIS — D649 Anemia, unspecified: Secondary | ICD-10-CM | POA: Diagnosis not present

## 2021-03-06 LAB — HEPATIC FUNCTION PANEL
ALT: 19 (ref 7–35)
AST: 34 (ref 13–35)
Alkaline Phosphatase: 171 — AB (ref 25–125)
Bilirubin, Total: 0.2

## 2021-03-06 LAB — BASIC METABOLIC PANEL
BUN: 18 (ref 4–21)
CO2: 26 — AB (ref 13–22)
Chloride: 105 (ref 99–108)
Creatinine: 1 (ref 0.5–1.1)
Glucose: 124
Potassium: 4 (ref 3.4–5.3)
Sodium: 140 (ref 137–147)

## 2021-03-06 LAB — CBC AND DIFFERENTIAL
HCT: 32 — AB (ref 36–46)
Hemoglobin: 10.2 — AB (ref 12.0–16.0)
Neutrophils Absolute: 4.77
Platelets: 336 (ref 150–399)
WBC: 7.7

## 2021-03-06 LAB — CORRECTED CALCIUM (CC13): Calcium, Corrected: 8.7

## 2021-03-06 LAB — CBC
MCV: 108 — AB (ref 81–99)
RBC: 2.93 — AB (ref 3.87–5.11)

## 2021-03-06 LAB — COMPREHENSIVE METABOLIC PANEL
Albumin: 3.3 — AB (ref 3.5–5.0)
Calcium: 8 — AB (ref 8.7–10.7)

## 2021-03-06 MED FILL — Famotidine in NaCl 0.9% IV Soln 20 MG/50ML: INTRAVENOUS | Qty: 100 | Status: AC

## 2021-03-06 MED FILL — Gemcitabine HCl Inj 1 GM/26.3ML (38 MG/ML) (Base Equiv): INTRAVENOUS | Qty: 35 | Status: AC

## 2021-03-06 MED FILL — Fosaprepitant Dimeglumine For IV Infusion 150 MG (Base Eq): INTRAVENOUS | Qty: 5 | Status: AC

## 2021-03-06 MED FILL — Dexamethasone Sodium Phosphate Inj 100 MG/10ML: INTRAMUSCULAR | Qty: 1 | Status: AC

## 2021-03-06 NOTE — Telephone Encounter (Signed)
Patient came by to schedule Oct/Nov Appt's.  Dr Bobby Rumpf has adjusted her treatments ([facility])

## 2021-03-09 ENCOUNTER — Encounter: Payer: Self-pay | Admitting: Oncology

## 2021-03-09 ENCOUNTER — Inpatient Hospital Stay: Payer: PPO

## 2021-03-09 ENCOUNTER — Other Ambulatory Visit: Payer: Self-pay

## 2021-03-09 VITALS — BP 124/68 | HR 91 | Temp 98.1°F | Resp 18 | Ht 65.5 in | Wt 121.0 lb

## 2021-03-09 DIAGNOSIS — Z5111 Encounter for antineoplastic chemotherapy: Secondary | ICD-10-CM | POA: Diagnosis not present

## 2021-03-09 DIAGNOSIS — C50111 Malignant neoplasm of central portion of right female breast: Secondary | ICD-10-CM

## 2021-03-09 DIAGNOSIS — Z170 Estrogen receptor positive status [ER+]: Secondary | ICD-10-CM

## 2021-03-09 MED ORDER — SODIUM CHLORIDE 0.9 % IV SOLN
800.0000 mg/m2 | Freq: Once | INTRAVENOUS | Status: AC
  Administered 2021-03-09: 1330 mg via INTRAVENOUS
  Filled 2021-03-09: qty 26.3

## 2021-03-09 MED ORDER — SODIUM CHLORIDE 0.9 % IV SOLN: Freq: Once | INTRAVENOUS | Status: AC

## 2021-03-09 MED ORDER — PALONOSETRON HCL INJECTION 0.25 MG/5ML
0.2500 mg | Freq: Once | INTRAVENOUS | Status: AC
  Administered 2021-03-09: 0.25 mg via INTRAVENOUS
  Filled 2021-03-09: qty 5

## 2021-03-09 MED ORDER — HEPARIN SOD (PORK) LOCK FLUSH 100 UNIT/ML IV SOLN
500.0000 [IU] | Freq: Once | INTRAVENOUS | Status: AC | PRN
  Administered 2021-03-09: 500 [IU]

## 2021-03-09 MED ORDER — SODIUM CHLORIDE 0.9 % IV SOLN
10.0000 mg | Freq: Once | INTRAVENOUS | Status: AC
  Administered 2021-03-09: 10 mg via INTRAVENOUS
  Filled 2021-03-09: qty 10

## 2021-03-09 MED ORDER — SODIUM CHLORIDE 0.9 % IV SOLN
150.0000 mg | Freq: Once | INTRAVENOUS | Status: AC
  Administered 2021-03-09: 150 mg via INTRAVENOUS
  Filled 2021-03-09: qty 150

## 2021-03-09 MED ORDER — SODIUM CHLORIDE 0.9% FLUSH
10.0000 mL | INTRAVENOUS | Status: DC | PRN
Start: 2021-03-09 — End: 2021-03-09
  Administered 2021-03-09: 10 mL

## 2021-03-09 MED ORDER — DIPHENHYDRAMINE HCL 50 MG/ML IJ SOLN
50.0000 mg | Freq: Once | INTRAMUSCULAR | Status: AC
  Administered 2021-03-09: 50 mg via INTRAVENOUS
  Filled 2021-03-09: qty 1

## 2021-03-09 MED ORDER — MAGNESIUM SULFATE 4 GM/100ML IV SOLN
4.0000 g | Freq: Once | INTRAVENOUS | Status: AC
  Administered 2021-03-09: 4 g via INTRAVENOUS
  Filled 2021-03-09: qty 100

## 2021-03-09 MED ORDER — FAMOTIDINE 20 MG IN NS 100 ML IVPB
20.0000 mg | Freq: Once | INTRAVENOUS | Status: AC
  Administered 2021-03-09: 20 mg via INTRAVENOUS
  Filled 2021-03-09: qty 20

## 2021-03-09 MED ORDER — SODIUM CHLORIDE 0.9 % IV SOLN
392.5000 mg | Freq: Once | INTRAVENOUS | Status: AC
  Administered 2021-03-09: 390 mg via INTRAVENOUS
  Filled 2021-03-09: qty 39

## 2021-03-09 NOTE — Patient Instructions (Signed)
[facility] [place]  Discharge Instructions: ?Thank you for choosing [facility] to provide your oncology and hematology care.  ?If you have a lab appointment with the Cancer Center, please go directly to the Cancer Center and check in at the registration area. ?  ?Wear comfortable clothing and clothing appropriate for easy access to any Portacath or PICC line.  ? ?We strive to give you quality time with your provider. You may need to reschedule your appointment if you arrive late (15 or more minutes).  Arriving late affects you and other patients whose appointments are after yours.  Also, if you miss three or more appointments without notifying the office, you may be dismissed from the clinic at the provider?s discretion.    ?  ?For prescription refill requests, have your pharmacy contact our office and allow 72 hours for refills to be completed.   ? ?Today you received the following chemotherapy and/or immunotherapy agents Carboplatin,Gemcitabine   ?  ?To help prevent nausea and vomiting after your treatment, we encourage you to take your nausea medication as directed. ? ?BELOW ARE SYMPTOMS THAT SHOULD BE REPORTED IMMEDIATELY: ?*FEVER GREATER THAN 100.4 F (38 ?C) OR HIGHER ?*CHILLS OR SWEATING ?*NAUSEA AND VOMITING THAT IS NOT CONTROLLED WITH YOUR NAUSEA MEDICATION ?*UNUSUAL SHORTNESS OF BREATH ?*UNUSUAL BRUISING OR BLEEDING ?*URINARY PROBLEMS (pain or burning when urinating, or frequent urination) ?*BOWEL PROBLEMS (unusual diarrhea, constipation, pain near the anus) ?TENDERNESS IN MOUTH AND THROAT WITH OR WITHOUT PRESENCE OF ULCERS (sore throat, sores in mouth, or a toothache) ?UNUSUAL RASH, SWELLING OR PAIN  ?UNUSUAL VAGINAL DISCHARGE OR ITCHING  ? ?Items with * indicate a potential emergency and should be followed up as soon as possible or go to the Emergency Department if any problems should occur. ? ?Please show the CHEMOTHERAPY ALERT CARD or IMMUNOTHERAPY ALERT CARD at  check-in to the Emergency Department and triage nurse. ? ?Should you have questions after your visit or need to cancel or reschedule your appointment, please contact [facility] [place]  Dept: [phone]  and follow the prompts.  Office hours are 8:00 a.m. to 4:30 p.m. Monday - Friday. Please note that voicemails left after 4:00 p.m. may not be returned until the following business day.  We are closed weekends and major holidays. You have access to a nurse at all times for urgent questions. Please call the main number to the clinic Dept: [phone] and follow the prompts. ? ?For any non-urgent questions, you may also contact your provider using MyChart. We now offer e-Visits for anyone 18 and older to request care online for non-urgent symptoms. For details visit [email or web address]. ?  ?Also download the MyChart app! Go to the app store, search "MyChart", open the app, select , and log in with your MyChart username and password. ? ?Due to Covid, a mask is required upon entering the hospital/clinic. If you do not have a mask, one will be given to you upon arrival. For doctor visits, patients may have 1 support person aged 18 or older with them. For treatment visits, patients cannot have anyone with them due to current Covid guidelines and our immunocompromised population.  ? ? ?

## 2021-03-09 NOTE — Progress Notes (Signed)
1648: PT STABLE AT TIME OF DISCHARGE

## 2021-03-10 ENCOUNTER — Other Ambulatory Visit: Payer: Self-pay | Admitting: Hematology and Oncology

## 2021-03-10 ENCOUNTER — Encounter: Payer: Self-pay | Admitting: Oncology

## 2021-03-10 DIAGNOSIS — R112 Nausea with vomiting, unspecified: Secondary | ICD-10-CM

## 2021-03-10 DIAGNOSIS — T451X5A Adverse effect of antineoplastic and immunosuppressive drugs, initial encounter: Secondary | ICD-10-CM

## 2021-03-17 ENCOUNTER — Encounter: Payer: Self-pay | Admitting: Oncology

## 2021-03-17 NOTE — Addendum Note (Signed)
Addended by: Juanetta Beets on: 03/17/2021 04:11 PM   Modules accepted: Orders

## 2021-03-19 NOTE — Progress Notes (Signed)
[facility]  [address] [place],  [place]  [address] [phone]  Clinic Day:  03/26/2021  Referring physician: Greig Right, MD  This document serves as a record of services personally performed by Dequincy Macarthur Critchley, MD. It was created on their behalf by [facility], a trained medical scribe. The creation of this record is based on the scribe's personal observations and the provider's statements to them.  HISTORY OF PRESENT ILLNESS:  The patient is a 60 y.o. female with metastatic hormone receptor positive breast cancer, which includes spread of disease to her stomach.  She comes in today to be evaluated before heading into her 2nd half of her 7th cycle of carboplatin/gemcitabine.  Based upon fatigue and low counts, her cycles of chemotherapy are now on a day 1,15-q-4-week schedule.  She still complains of intermittent abdominal pain and nausea, for which she has been more reliant upon pain and antinausea medication recently.  Weakness also remains an issue.  Otherwise, she denies having any new symptoms or findings which concern her for disease progression.  VITALS:  Blood pressure 107/64, pulse 90, temperature 98 F (36.7 C), resp. rate 14, height 5' 5.5" (1.664 m), weight 117 lb (53.1 kg), SpO2 100 %.  Wt Readings from Last 3 Encounters:  03/26/21 117 lb (53.1 kg)  03/09/21 121 lb (54.9 kg)  02/27/21 126 lb 4 oz (57.3 kg)    Body mass index is 19.17 kg/m.  Performance status (ECOG): 0 - Asymptomatic  PHYSICAL EXAM:  Physical Exam Constitutional:      General: She is not in acute distress.    Appearance: Normal appearance. She is normal weight.  HENT:     Head: Normocephalic and atraumatic.  Eyes:     General: No scleral icterus.    Extraocular Movements: Extraocular movements intact.     Conjunctiva/sclera: Conjunctivae normal.     Pupils: Pupils are equal, round, and reactive to light.  Cardiovascular:     Rate and Rhythm: Normal  rate and regular rhythm.     Pulses: Normal pulses.     Heart sounds: Normal heart sounds. No murmur heard.   No friction rub. No gallop.  Pulmonary:     Effort: Pulmonary effort is normal. No respiratory distress.     Breath sounds: Normal breath sounds.  Abdominal:     General: Bowel sounds are normal. There is no distension.     Palpations: Abdomen is soft. There is no hepatomegaly, splenomegaly or mass.     Tenderness: There is no abdominal tenderness.  Musculoskeletal:        General: Normal range of motion.     Cervical back: Normal range of motion and neck supple.     Right lower leg: No edema.     Left lower leg: No edema.  Lymphadenopathy:     Cervical: No cervical adenopathy.  Skin:    General: Skin is warm and dry.  Neurological:     General: No focal deficit present.     Mental Status: She is alert and oriented to person, place, and time. Mental status is at baseline.  Psychiatric:        Mood and Affect: Mood normal.        Behavior: Behavior normal.        Thought Content: Thought content normal.        Judgment: Judgment normal.    LABS:   CBC Latest Ref Rng & Units 03/26/2021 03/06/2021 02/27/2021  WBC -  3.5 7.7 11.6  Hemoglobin 12.0 - 16.0 10.1(A) 10.2(A) 9.4(A)  Hematocrit 36 - 46 31(A) 32(A) 29(A)  Platelets 150 - 399 119(A) 336 34(A)   CMP Latest Ref Rng & Units 03/26/2021 03/06/2021 02/27/2021  BUN 4 - _0 25(A)  Creatinine 0.5 - 1.1 0.9 1.0 1.0  Sodium 137 - 147 140 140 138  Potassium 3.4 - 5.3 3.4 4.0 3.4  Chloride 99 - 108 104 105 100  CO2 13 - 22 24(A) 26(A) 27(A)  Calcium 8.7 - 10.7 8.1(A) 8.0(A) 8.1(A)  Total Protein 6.3 - 8.2 g/dL - - -  Alkaline Phos 25 - 125 137(A) 171(A) 178(A)  AST 13 - 35 32 34 30  ALT 7 - 35 _1 ASSESSMENT & PLAN:  Assessment/Plan:  A 60 y.o. female with metastatic hormone positive breast cancer.  She will proceed with cycle 7, day 15 of  carboplatin/gemcitabine tomorrow.  As her [place] is borderline low, she  will receive Neulasta to prevent severe neutropenia from delaying successive cycles of treatment.  Her oxycodone pain medication will be refilled today.  I will see her back in 2 weeks before she heads into cycle 8, day 1 of carboplatin/gemcitabine. The patient understands the plans discussed today and knows to contact our office if she runs into any problems that require immediate clinical attention.  I, Rita Ohara, am acting as scribe for Marice Potter, MD    I have reviewed this report as typed by the medical scribe, and it is complete and accurate.  Dequincy Macarthur Critchley, MD

## 2021-03-23 ENCOUNTER — Encounter: Payer: Self-pay | Admitting: Oncology

## 2021-03-25 ENCOUNTER — Telehealth: Payer: Self-pay | Admitting: Oncology

## 2021-03-25 NOTE — Telephone Encounter (Signed)
Patient called to reschedule Injection to 10/13, 11/10 due to being out-of-town on 10/7 - 10/12

## 2021-03-26 ENCOUNTER — Other Ambulatory Visit: Payer: Self-pay | Admitting: Hematology and Oncology

## 2021-03-26 ENCOUNTER — Inpatient Hospital Stay (INDEPENDENT_AMBULATORY_CARE_PROVIDER_SITE_OTHER): Payer: PPO | Admitting: Oncology

## 2021-03-26 ENCOUNTER — Telehealth: Payer: Self-pay

## 2021-03-26 ENCOUNTER — Other Ambulatory Visit: Payer: Self-pay | Admitting: Oncology

## 2021-03-26 ENCOUNTER — Inpatient Hospital Stay: Payer: PPO | Attending: Oncology

## 2021-03-26 ENCOUNTER — Telehealth: Payer: Self-pay | Admitting: Oncology

## 2021-03-26 ENCOUNTER — Other Ambulatory Visit: Payer: Self-pay

## 2021-03-26 VITALS — BP 107/64 | HR 90 | Temp 98.0°F | Resp 14 | Ht 65.5 in | Wt 117.0 lb

## 2021-03-26 DIAGNOSIS — C50111 Malignant neoplasm of central portion of right female breast: Secondary | ICD-10-CM

## 2021-03-26 DIAGNOSIS — C785 Secondary malignant neoplasm of large intestine and rectum: Secondary | ICD-10-CM

## 2021-03-26 DIAGNOSIS — Z23 Encounter for immunization: Secondary | ICD-10-CM

## 2021-03-26 DIAGNOSIS — Z5111 Encounter for antineoplastic chemotherapy: Secondary | ICD-10-CM | POA: Insufficient documentation

## 2021-03-26 DIAGNOSIS — Z170 Estrogen receptor positive status [ER+]: Secondary | ICD-10-CM | POA: Insufficient documentation

## 2021-03-26 DIAGNOSIS — C7989 Secondary malignant neoplasm of other specified sites: Secondary | ICD-10-CM | POA: Insufficient documentation

## 2021-03-26 DIAGNOSIS — C7889 Secondary malignant neoplasm of other digestive organs: Secondary | ICD-10-CM

## 2021-03-26 DIAGNOSIS — C50919 Malignant neoplasm of unspecified site of unspecified female breast: Secondary | ICD-10-CM | POA: Insufficient documentation

## 2021-03-26 DIAGNOSIS — D709 Neutropenia, unspecified: Secondary | ICD-10-CM | POA: Insufficient documentation

## 2021-03-26 DIAGNOSIS — D539 Nutritional anemia, unspecified: Secondary | ICD-10-CM

## 2021-03-26 LAB — BASIC METABOLIC PANEL
BUN: 17 (ref 4–21)
CO2: 24 — AB (ref 13–22)
Chloride: 104 (ref 99–108)
Creatinine: 0.9 (ref 0.5–1.1)
Glucose: 137
Potassium: 3.4 (ref 3.4–5.3)
Sodium: 140 (ref 137–147)

## 2021-03-26 LAB — CBC AND DIFFERENTIAL
HCT: 31 — AB (ref 36–46)
Hemoglobin: 10.1 — AB (ref 12.0–16.0)
Neutrophils Absolute: 1.33
Platelets: 119 — AB (ref 150–399)
WBC: 3.5

## 2021-03-26 LAB — COMPREHENSIVE METABOLIC PANEL
Albumin: 3.2 — AB (ref 3.5–5.0)
Calcium: 8.1 — AB (ref 8.7–10.7)

## 2021-03-26 LAB — HEPATIC FUNCTION PANEL
ALT: 27 (ref 7–35)
AST: 32 (ref 13–35)
Alkaline Phosphatase: 137 — AB (ref 25–125)
Bilirubin, Total: 0.3

## 2021-03-26 LAB — CBC: RBC: 2.87 — AB (ref 3.87–5.11)

## 2021-03-26 MED ORDER — OXYCODONE HCL 15 MG PO TABS: 15.0000 mg | ORAL_TABLET | Freq: Four times a day (QID) | ORAL | 0 refills | Status: DC | PRN

## 2021-03-26 MED FILL — Gemcitabine HCl Inj 1 GM/26.3ML (38 MG/ML) (Base Equiv): INTRAVENOUS | Qty: 35 | Status: AC

## 2021-03-26 NOTE — Telephone Encounter (Signed)
Per 10/6 LOS, updated 10/20 Labs to 8:45 am - Added Follow up 9:15 am.  Patient will get Updated Appt Calendar on next Infusion Appt

## 2021-03-27 ENCOUNTER — Inpatient Hospital Stay: Payer: PPO

## 2021-03-27 ENCOUNTER — Other Ambulatory Visit: Payer: Self-pay

## 2021-03-27 ENCOUNTER — Encounter: Payer: Self-pay | Admitting: Oncology

## 2021-03-27 ENCOUNTER — Other Ambulatory Visit: Payer: Self-pay | Admitting: Oncology

## 2021-03-27 VITALS — BP 94/62 | HR 106 | Temp 97.6°F | Resp 18 | Ht 65.5 in | Wt 118.2 lb

## 2021-03-27 DIAGNOSIS — Z170 Estrogen receptor positive status [ER+]: Secondary | ICD-10-CM

## 2021-03-27 DIAGNOSIS — Z23 Encounter for immunization: Secondary | ICD-10-CM

## 2021-03-27 DIAGNOSIS — C7889 Secondary malignant neoplasm of other digestive organs: Secondary | ICD-10-CM | POA: Diagnosis present

## 2021-03-27 DIAGNOSIS — C50919 Malignant neoplasm of unspecified site of unspecified female breast: Secondary | ICD-10-CM | POA: Diagnosis not present

## 2021-03-27 DIAGNOSIS — C50111 Malignant neoplasm of central portion of right female breast: Secondary | ICD-10-CM

## 2021-03-27 DIAGNOSIS — Z5111 Encounter for antineoplastic chemotherapy: Secondary | ICD-10-CM | POA: Diagnosis not present

## 2021-03-27 DIAGNOSIS — D709 Neutropenia, unspecified: Secondary | ICD-10-CM | POA: Diagnosis not present

## 2021-03-27 DIAGNOSIS — C7989 Secondary malignant neoplasm of other specified sites: Secondary | ICD-10-CM | POA: Diagnosis not present

## 2021-03-27 DIAGNOSIS — D539 Nutritional anemia, unspecified: Secondary | ICD-10-CM

## 2021-03-27 LAB — FERRITIN: Ferritin: 215 ng/mL (ref 11–307)

## 2021-03-27 LAB — IRON AND TIBC
Iron: 69 ug/dL (ref 28–170)
Saturation Ratios: 32 % — ABNORMAL HIGH (ref 10.4–31.8)
TIBC: 214 ug/dL — ABNORMAL LOW (ref 250–450)
UIBC: 145 ug/dL

## 2021-03-27 LAB — FOLATE: Folate: 33.9 ng/mL (ref >5.9)

## 2021-03-27 LAB — VITAMIN B12: Vitamin B-12: 561 pg/mL (ref 180–914)

## 2021-03-27 MED ORDER — PALONOSETRON HCL INJECTION 0.25 MG/5ML
0.2500 mg | Freq: Once | INTRAVENOUS | Status: AC
  Administered 2021-03-27: 0.25 mg via INTRAVENOUS
  Filled 2021-03-27: qty 5

## 2021-03-27 MED ORDER — INFLUENZA VAC SPLIT QUAD 0.5 ML IM SUSY
0.5000 mL | PREFILLED_SYRINGE | Freq: Once | INTRAMUSCULAR | Status: AC
  Administered 2021-03-27: 0.5 mL via INTRAMUSCULAR

## 2021-03-27 MED ORDER — SODIUM CHLORIDE 0.9 % IV SOLN
800.0000 mg/m2 | Freq: Once | INTRAVENOUS | Status: AC
  Administered 2021-03-27: 1330 mg via INTRAVENOUS
  Filled 2021-03-27: qty 26.3

## 2021-03-27 MED ORDER — SODIUM CHLORIDE 0.9 % IV SOLN: Freq: Once | INTRAVENOUS | Status: AC

## 2021-03-27 MED ORDER — PEGFILGRASTIM 6 MG/0.6ML ~~LOC~~ PSKT
6.0000 mg | PREFILLED_SYRINGE | Freq: Once | SUBCUTANEOUS | Status: AC
  Administered 2021-03-27: 6 mg via SUBCUTANEOUS

## 2021-03-27 MED ORDER — MAGNESIUM SULFATE 4 GM/100ML IV SOLN
4.0000 g | Freq: Once | INTRAVENOUS | Status: AC
Start: 2021-03-27 — End: 2021-03-27
  Administered 2021-03-27: 4 g via INTRAVENOUS
  Filled 2021-03-27: qty 100

## 2021-03-27 MED ORDER — HEPARIN SOD (PORK) LOCK FLUSH 100 UNIT/ML IV SOLN
500.0000 [IU] | Freq: Once | INTRAVENOUS | Status: AC | PRN
  Administered 2021-03-27: 500 [IU]

## 2021-03-27 MED ORDER — SODIUM CHLORIDE 0.9% FLUSH
10.0000 mL | INTRAVENOUS | Status: DC | PRN
Start: 2021-03-27 — End: 2021-03-27
  Administered 2021-03-27: 10 mL

## 2021-03-27 MED ORDER — SODIUM CHLORIDE 0.9 % IV SOLN: Freq: Once | INTRAVENOUS | Status: DC

## 2021-03-27 NOTE — Progress Notes (Signed)
1241: PT STABLE AT TIME OF DISCHARGE  

## 2021-03-27 NOTE — Patient Instructions (Addendum)
[facility]  Discharge Instructions: Thank you for choosing [facility] to provide your oncology and hematology care.  If you have a lab appointment with the [facility], please go directly to the [facility] and check in at the registration area.   Wear comfortable clothing and clothing appropriate for easy access to any Portacath or PICC line.   We strive to give you quality time with your provider. You may need to reschedule your appointment if you arrive late (15 or more minutes).  Arriving late affects you and other patients whose appointments are after yours.  Also, if you miss three or more appointments without notifying the office, you may be dismissed from the clinic at the provider's discretion.      For prescription refill requests, have your pharmacy contact our office and allow 72 hours for refills to be completed.    Today you received the following chemotherapy and/or immunotherapy agents Gemcitabine    To help prevent nausea and vomiting after your treatment, we encourage you to take your nausea medication as directed.  BELOW ARE SYMPTOMS THAT SHOULD BE REPORTED IMMEDIATELY: *FEVER GREATER THAN 100.4 F (38 C) OR HIGHER *CHILLS OR SWEATING *NAUSEA AND VOMITING THAT IS NOT CONTROLLED WITH YOUR NAUSEA MEDICATION *UNUSUAL SHORTNESS OF BREATH *UNUSUAL BRUISING OR BLEEDING *URINARY PROBLEMS (pain or burning when urinating, or frequent urination) *BOWEL PROBLEMS (unusual diarrhea, constipation, pain near the anus) TENDERNESS IN MOUTH AND THROAT WITH OR WITHOUT PRESENCE OF ULCERS (sore throat, sores in mouth, or a toothache) UNUSUAL RASH, SWELLING OR PAIN  UNUSUAL VAGINAL DISCHARGE OR ITCHING   Items with * indicate a potential emergency and should be followed up as soon as possible or go to the Emergency Department if any problems should occur.  Please show the CHEMOTHERAPY ALERT CARD or IMMUNOTHERAPY ALERT CARD at check-in to the  Emergency Department and triage nurse.  Should you have questions after your visit or need to cancel or reschedule your appointment, please contact [facility]  Dept: [phone]  and follow the prompts.  Office hours are 8:00 a.m. to 4:30 p.m. Monday - Friday. Please note that voicemails left after 4:00 p.m. may not be returned until the following business day.  We are closed weekends and major holidays. You have access to a nurse at all times for urgent questions. Please call the main number to the clinic Dept: [phone] and follow the prompts.  For any non-urgent questions, you may also contact your provider using MyChart. We now offer e-Visits for anyone 59 and older to request care online for non-urgent symptoms. For details visit [email or web address].   Also download the MyChart app! Go to the app store, search "MyChart", open the app, select [facility], and log in with your MyChart username and password.  Due to Covid, a mask is required upon entering the hospital/clinic. If you do not have a mask, one will be given to you upon arrival. For doctor visits, patients may have 1 support person aged 67 or older with them. For treatment visits, patients cannot have anyone with them due to current Covid guidelines and our immunocompromised population.   Octreotide acetate injection suspension What is this medication? OCTREOTIDE (ok TREE oh tide) is used to reduce blood levels of growth hormone in patients with a condition called acromegaly. This medicine also reduces flushing and watery diarrhea caused by certain types of cancer. This medicine may be used for other purposes; ask your  health care provider or pharmacist if you have questions. COMMON BRAND NAME(S): Sandostatin LAR What should I tell my care team before I take this medication? They need to know if you have any of these conditions: diabetes gallbladder disease kidney disease liver disease thyroid  disease an unusual or allergic reaction to octreotide, other medicines, foods, dyes, or preservatives pregnant or trying to get pregnant breast-feeding How should I use this medication? This medicine is for injection into a muscle. It is usually given by a health care professional in a hospital or clinic setting. Talk to your pediatrician regarding the use of this medicine in children. Special care may be needed. Overdosage: If you think you have taken too much of this medicine contact a poison control center or emergency room at once. NOTE: This medicine is only for you. Do not share this medicine with others. What if I miss a dose? Keep appointments for follow-up doses. It is important not to miss your dose. Call your doctor or health care professional if you are unable to keep an appointment. What may interact with this medication? Do not take this medicine with any of the following medications: cisapride dronedarone flibanserin lutetium Lu 177 dotatate pimozide saquinavir thioridazine This medicine may also interact with the following medications: bromocriptine certain medicines for blood pressure, heart disease, irregular heartbeat cyclosporine diuretics medicines for diabetes, including insulin quinidine This list may not describe all possible interactions. Give your health care provider a list of all the medicines, herbs, non-prescription drugs, or dietary supplements you use. Also tell them if you smoke, drink alcohol, or use illegal drugs. Some items may interact with your medicine. What should I watch for while using this medication? Visit your health care professional for regular checks on your progress. Tell your health care professional if your symptoms do not start to get better or if they get worse. This medicine may cause decreases in blood sugar. Signs of low blood sugar include chills, cool, pale skin or cold sweats, drowsiness, extreme hunger, fast heartbeat,  headache, nausea, nervousness or anxiety, shakiness, trembling, unsteadiness, tiredness, or weakness. Contact your doctor or health care professional right away if you experience any of these symptoms. This medicine may increase blood sugar. Ask your healthcare provider if changes in diet or medicines are needed if you have diabetes. This medicine may cause a decrease in vitamin B12. You should make sure that you get enough vitamin B12 while you are taking this medicine. Discuss the foods you eat and the vitamins you take with your health care professional. What side effects may I notice from receiving this medication? Side effects that you should report to your doctor or health care professional as soon as possible: allergic reactions like skin rash, itching or hives, swelling of the face, lips, or tongue fast, slow, or irregular heartbeat right upper belly pain severe stomach pain signs and symptoms of high blood sugar such as being more thirsty or hungry or having to urinate more than normal. You may also feel very tired or have blurry vision. signs and symptoms of low blood sugar such as feeling anxious; confusion; dizziness; increased hunger; unusually weak or tired; increased sweating; shakiness; cold, clammy skin; irritable; headache; blurred vision; fast heartbeat; loss of consciousness unusually weak or tired Side effects that usually do not require medical attention (report these to your doctor or health care professional if they continue or are bothersome): diarrhea dizziness gas headache nausea, vomiting pain, redness, or irritation at site where injected upset  stomach This list may not describe all possible side effects. Call your doctor for medical advice about side effects. You may report side effects to FDA at 1-800-FDA-1088. Where should I keep my medication? This medicine is given in a hospital or clinic and will not be stored at home. NOTE: This sheet is a summary. It may  not cover all possible information. If you have questions about this medicine, talk to your doctor, pharmacist, or health care provider.  2022 Elsevier/Gold Standard (2019-09-25 18:31:50) Magnesium Sulfate Injection What is this medication? MAGNESIUM SULFATE (mag NEE zee um SUL fate) prevents and treats low levels of magnesium in your body. It may also be used to prevent and treat seizures during pregnancy in people with high blood pressure disorders, such as preeclampsia or eclampsia. Magnesium plays an important role in maintaining the health of your muscles and nervous system. This medicine may be used for other purposes; ask your health care provider or pharmacist if you have questions. What should I tell my care team before I take this medication? They need to know if you have any of these conditions: Heart disease History of irregular heart beat Kidney disease An unusual or allergic reaction to magnesium sulfate, medications, foods, dyes, or preservatives Pregnant or trying to get pregnant Breast-feeding How should I use this medication? This medication is for infusion into a vein. It is given in a hospital or clinic setting. Talk to your care team about the use of this medication in children. While this medication may be prescribed for selected conditions, precautions do apply. Overdosage: If you think you have taken too much of this medicine contact a poison control center or emergency room at once. NOTE: This medicine is only for you. Do not share this medicine with others. What if I miss a dose? This does not apply. What may interact with this medication? Certain medications for anxiety or sleep Certain medications for seizures like phenobarbital Digoxin Medications that relax muscles for surgery Narcotic medications for pain This list may not describe all possible interactions. Give your health care provider a list of all the medicines, herbs, non-prescription drugs, or dietary  supplements you use. Also tell them if you smoke, drink alcohol, or use illegal drugs. Some items may interact with your medicine. What should I watch for while using this medication? Your condition will be monitored carefully while you are receiving this medication. You may need blood work done while you are receiving this medication. What side effects may I notice from receiving this medication? Side effects that you should report to your care team as soon as possible: Allergic reactions-skin rash, itching, hives, swelling of the face, lips, tongue, or throat High magnesium level-confusion, drowsiness, facial flushing, redness, sweating, muscle weakness, fast or irregular heartbeat, trouble breathing Low blood pressure-dizziness, feeling faint or lightheaded, blurry vision Side effects that usually do not require medical attention (report to your care team if they continue or are bothersome): Headache Nausea This list may not describe all possible side effects. Call your doctor for medical advice about side effects. You may report side effects to FDA at 1-800-FDA-1088. Where should I keep my medication? This medication is given in a hospital or clinic and will not be stored at home. NOTE: This sheet is a summary. It may not cover all possible information. If you have questions about this medicine, talk to your doctor, pharmacist, or health care provider.  2022 Elsevier/Gold Standard (2020-07-28 13:10:26)

## 2021-03-30 ENCOUNTER — Ambulatory Visit: Payer: PPO

## 2021-04-01 ENCOUNTER — Other Ambulatory Visit: Payer: PPO

## 2021-04-02 ENCOUNTER — Ambulatory Visit: Payer: PPO

## 2021-04-03 NOTE — Progress Notes (Signed)
[facility]  [address] [place],  [place]  [address] [phone]  Clinic Day:  04/09/2021  Referring physician: Greig Right, MD  This document serves as a record of services personally performed by Dequincy Macarthur Critchley, MD. It was created on their behalf by [facility] E, a trained medical scribe. The creation of this record is based on the scribe's personal observations and the provider's statements to them.  HISTORY OF PRESENT ILLNESS:  The patient is a 60 y.o. female with metastatic hormone receptor positive breast cancer, which includes spread of disease to her stomach.  She comes in today to be evaluated before heading into her 8th cycle of carboplatin/gemcitabine.  Based upon fatigue and low counts, her cycles of chemotherapy are now on a day 1,15-q-4-week schedule.  She still has intermittent abdominal pain and nausea, for which she has been more reliant upon pain and antinausea medication recently.  Weakness also remains an issue.  Otherwise, she denies having any new symptoms or findings which concern her for disease progression.  VITALS:  Blood pressure (!) 83/52, pulse (!) 107, temperature 97.8 F (36.6 C), resp. rate 14, height 5' 5.5" (1.664 m), weight 116 lb 4.8 oz (52.8 kg), SpO2 98 %.  Wt Readings from Last 3 Encounters:  04/09/21 116 lb 4.8 oz (52.8 kg)  03/27/21 118 lb 4 oz (53.6 kg)  03/26/21 117 lb (53.1 kg)    Body mass index is 19.06 kg/m.  Performance status (ECOG): 1  PHYSICAL EXAM:  Physical Exam Constitutional:      General: She is not in acute distress.    Appearance: Normal appearance. She is normal weight.  HENT:     Head: Normocephalic and atraumatic.  Eyes:     General: No scleral icterus.    Extraocular Movements: Extraocular movements intact.     Conjunctiva/sclera: Conjunctivae normal.     Pupils: Pupils are equal, round, and reactive to light.  Cardiovascular:     Rate and Rhythm: Normal rate and regular  rhythm.     Pulses: Normal pulses.     Heart sounds: Normal heart sounds. No murmur heard.   No friction rub. No gallop.  Pulmonary:     Effort: Pulmonary effort is normal. No respiratory distress.     Breath sounds: Normal breath sounds.  Abdominal:     General: Bowel sounds are normal. There is no distension.     Palpations: Abdomen is soft. There is no hepatomegaly, splenomegaly or mass.     Tenderness: There is no abdominal tenderness.  Musculoskeletal:        General: Normal range of motion.     Cervical back: Normal range of motion and neck supple.     Right lower leg: No edema.     Left lower leg: No edema.  Lymphadenopathy:     Cervical: No cervical adenopathy.  Skin:    General: Skin is warm and dry.  Neurological:     General: No focal deficit present.     Mental Status: She is alert and oriented to person, place, and time. Mental status is at baseline.  Psychiatric:        Mood and Affect: Mood normal.        Behavior: Behavior normal.        Thought Content: Thought content normal.        Judgment: Judgment normal.    LABS:   CBC Latest Ref Rng & Units 04/09/2021 03/26/2021 03/06/2021  WBC -  7.9 3.5 7.7  Hemoglobin 12.0 - 16.0 10.7(A) 10.1(A) 10.2(A)  Hematocrit 36 - 46 32(A) 31(A) 32(A)  Platelets 150 - 399 236 119(A) 336   CMP Latest Ref Rng & Units 04/09/2021 03/26/2021 03/06/2021  BUN 4 - 21 18 17 18   Creatinine 0.5 - 1.1 1.0 0.9 1.0  Sodium 137 - 147 140 140 140  Potassium 3.4 - 5.3 3.7 3.4 4.0  Chloride 99 - 108 106 104 105  CO2 13 - 22 27(A) 24(A) 26(A)  Calcium 8.7 - 10.7 7.8(A) 8.1(A) 8.0(A)  Total Protein 6.3 - 8.2 g/dL - - -  Alkaline Phos 25 - 125 178(A) 137(A) 171(A)  AST 13 - 35 38(A) 32 34  ALT 7 - 35 24 27 19    Magnesium 1.2  ASSESSMENT & PLAN:  Assessment/Plan:  A 60 y.o. female with metastatic hormone positive breast cancer.  She will proceed with cycle 8, day 1 of carboplatin/gemcitabine tomorrow.  As her blood pressure is low and she  feels occasionally lightheaded, I will give her 1.5 L of normal saline.  She will also receive 6 grams of magnesium sulfate for her hypomagnesemia.  Otherwise, I will see her back in 4 weeks before she heads into her 9th cycle of carboplatin/gemcitabine. CT scans will be done before her next visit to ascertain her new disease baseline after 8 cycles of chemotherapy.  The patient understands the plans discussed today and is in agreement with them.    I, Rita Ohara, am acting as scribe for Marice Potter, MD    I have reviewed this report as typed by the medical scribe, and it is complete and accurate.  Dequincy Macarthur Critchley, MD

## 2021-04-07 ENCOUNTER — Encounter: Payer: Self-pay | Admitting: Oncology

## 2021-04-08 ENCOUNTER — Other Ambulatory Visit: Payer: Self-pay | Admitting: Oncology

## 2021-04-09 ENCOUNTER — Inpatient Hospital Stay: Payer: PPO

## 2021-04-09 ENCOUNTER — Other Ambulatory Visit: Payer: PPO

## 2021-04-09 ENCOUNTER — Other Ambulatory Visit: Payer: Self-pay | Admitting: Hematology and Oncology

## 2021-04-09 ENCOUNTER — Inpatient Hospital Stay (INDEPENDENT_AMBULATORY_CARE_PROVIDER_SITE_OTHER): Payer: PPO | Admitting: Oncology

## 2021-04-09 ENCOUNTER — Other Ambulatory Visit: Payer: Self-pay | Admitting: Oncology

## 2021-04-09 ENCOUNTER — Telehealth: Payer: Self-pay | Admitting: Oncology

## 2021-04-09 VITALS — BP 83/52 | HR 107 | Temp 97.8°F | Resp 14 | Ht 65.5 in | Wt 116.3 lb

## 2021-04-09 DIAGNOSIS — Z170 Estrogen receptor positive status [ER+]: Secondary | ICD-10-CM

## 2021-04-09 DIAGNOSIS — C7889 Secondary malignant neoplasm of other digestive organs: Secondary | ICD-10-CM

## 2021-04-09 DIAGNOSIS — C50111 Malignant neoplasm of central portion of right female breast: Secondary | ICD-10-CM | POA: Diagnosis not present

## 2021-04-09 DIAGNOSIS — C785 Secondary malignant neoplasm of large intestine and rectum: Secondary | ICD-10-CM

## 2021-04-09 LAB — BASIC METABOLIC PANEL
BUN: 18 (ref 4–21)
CO2: 27 — AB (ref 13–22)
Chloride: 106 (ref 99–108)
Creatinine: 1 (ref 0.5–1.1)
Glucose: 99
Potassium: 3.7 (ref 3.4–5.3)
Sodium: 140 (ref 137–147)

## 2021-04-09 LAB — HEPATIC FUNCTION PANEL
ALT: 24 (ref 7–35)
AST: 38 — AB (ref 13–35)
Alkaline Phosphatase: 178 — AB (ref 25–125)
Bilirubin, Total: 0.3

## 2021-04-09 LAB — CBC AND DIFFERENTIAL
HCT: 32 — AB (ref 36–46)
Hemoglobin: 10.7 — AB (ref 12.0–16.0)
Neutrophils Absolute: 5.53
Platelets: 236 (ref 150–399)
WBC: 7.9

## 2021-04-09 LAB — COMPREHENSIVE METABOLIC PANEL
Albumin: 3 — AB (ref 3.5–5.0)
Calcium: 7.8 — AB (ref 8.7–10.7)

## 2021-04-09 LAB — CORRECTED CALCIUM (CC13): Calcium, Corrected: 8.8

## 2021-04-09 LAB — CBC
MCV: 109 — AB (ref 81–99)
RBC: 2.95 — AB (ref 3.87–5.11)

## 2021-04-09 MED FILL — Fosaprepitant Dimeglumine For IV Infusion 150 MG (Base Eq): INTRAVENOUS | Qty: 5 | Status: AC

## 2021-04-09 MED FILL — Carboplatin IV Soln 450 MG/45ML: INTRAVENOUS | Qty: 39 | Status: AC

## 2021-04-09 MED FILL — Gemcitabine HCl Inj 1 GM/26.3ML (38 MG/ML) (Base Equiv): INTRAVENOUS | Qty: 35 | Status: AC

## 2021-04-09 MED FILL — Dexamethasone Sodium Phosphate Inj 100 MG/10ML: INTRAMUSCULAR | Qty: 1 | Status: AC

## 2021-04-09 NOTE — Telephone Encounter (Signed)
Per 10/20 LOS, patient's Labs, CT Scans scheduled for 11/16, 11/17 Follow Up, 11/18 Infusion.  Gave patient Orders/Appt Summary/Instructions

## 2021-04-10 ENCOUNTER — Other Ambulatory Visit: Payer: Self-pay

## 2021-04-10 ENCOUNTER — Inpatient Hospital Stay: Payer: PPO

## 2021-04-10 ENCOUNTER — Other Ambulatory Visit: Payer: Self-pay | Admitting: Pharmacist

## 2021-04-10 VITALS — BP 100/72 | HR 112 | Resp 18 | Ht 65.5 in | Wt 119.2 lb

## 2021-04-10 DIAGNOSIS — Z170 Estrogen receptor positive status [ER+]: Secondary | ICD-10-CM

## 2021-04-10 DIAGNOSIS — Z5111 Encounter for antineoplastic chemotherapy: Secondary | ICD-10-CM | POA: Diagnosis not present

## 2021-04-10 MED ORDER — DIPHENHYDRAMINE HCL 50 MG/ML IJ SOLN
50.0000 mg | Freq: Once | INTRAMUSCULAR | Status: AC
  Administered 2021-04-10: 50 mg via INTRAVENOUS
  Filled 2021-04-10: qty 1

## 2021-04-10 MED ORDER — SODIUM CHLORIDE 0.9 % IV SOLN
150.0000 mg | Freq: Once | INTRAVENOUS | Status: AC
  Administered 2021-04-10: 150 mg via INTRAVENOUS
  Filled 2021-04-10: qty 150

## 2021-04-10 MED ORDER — MAGNESIUM SULFATE 4 GM/100ML IV SOLN
4.0000 g | Freq: Once | INTRAVENOUS | Status: AC
  Administered 2021-04-10: 4 g via INTRAVENOUS
  Filled 2021-04-10: qty 100

## 2021-04-10 MED ORDER — SODIUM CHLORIDE 0.9 % IV SOLN
800.0000 mg/m2 | Freq: Once | INTRAVENOUS | Status: AC
  Administered 2021-04-10: 1330 mg via INTRAVENOUS
  Filled 2021-04-10: qty 26.3

## 2021-04-10 MED ORDER — PALONOSETRON HCL INJECTION 0.25 MG/5ML
0.2500 mg | Freq: Once | INTRAVENOUS | Status: AC
  Administered 2021-04-10: 0.25 mg via INTRAVENOUS
  Filled 2021-04-10: qty 5

## 2021-04-10 MED ORDER — SODIUM CHLORIDE 0.9% FLUSH
10.0000 mL | INTRAVENOUS | Status: DC | PRN
Start: 2021-04-10 — End: 2021-04-10
  Administered 2021-04-10: 10 mL

## 2021-04-10 MED ORDER — SODIUM CHLORIDE 0.9 % IV SOLN: Freq: Once | INTRAVENOUS | Status: AC

## 2021-04-10 MED ORDER — SODIUM CHLORIDE 0.9 % IV SOLN
392.5000 mg | Freq: Once | INTRAVENOUS | Status: AC
  Administered 2021-04-10: 390 mg via INTRAVENOUS
  Filled 2021-04-10: qty 39

## 2021-04-10 MED ORDER — SODIUM CHLORIDE 0.9 % IV SOLN: Freq: Once | INTRAVENOUS | Status: DC

## 2021-04-10 MED ORDER — SODIUM CHLORIDE 0.9 % IV SOLN
10.0000 mg | Freq: Once | INTRAVENOUS | Status: AC
  Administered 2021-04-10: 10 mg via INTRAVENOUS
  Filled 2021-04-10: qty 10

## 2021-04-10 MED ORDER — MAGNESIUM SULFATE 2 GM/50ML IV SOLN
2.0000 g | Freq: Once | INTRAVENOUS | Status: AC
  Administered 2021-04-10: 2 g via INTRAVENOUS
  Filled 2021-04-10: qty 50

## 2021-04-10 MED ORDER — FAMOTIDINE 20 MG IN NS 100 ML IVPB
20.0000 mg | Freq: Once | INTRAVENOUS | Status: AC
  Administered 2021-04-10: 20 mg via INTRAVENOUS
  Filled 2021-04-10: qty 100

## 2021-04-10 MED ORDER — HEPARIN SOD (PORK) LOCK FLUSH 100 UNIT/ML IV SOLN
500.0000 [IU] | Freq: Once | INTRAVENOUS | Status: AC | PRN
  Administered 2021-04-10: 500 [IU]

## 2021-04-10 NOTE — Progress Notes (Signed)
1600: PT STABLE AT TIME OF DISCHARGE  

## 2021-04-10 NOTE — Patient Instructions (Signed)
[facility]  Discharge Instructions: ?Thank you for choosing [facility] to provide your oncology and hematology care.  ?If you have a lab appointment with the Cancer Center, please go directly to the Cancer Center and check in at the registration area. ?  ?Wear comfortable clothing and clothing appropriate for easy access to any Portacath or PICC line.  ? ?We strive to give you quality time with your provider. You may need to reschedule your appointment if you arrive late (15 or more minutes).  Arriving late affects you and other patients whose appointments are after yours.  Also, if you miss three or more appointments without notifying the office, you may be dismissed from the clinic at the provider?s discretion.    ?  ?For prescription refill requests, have your pharmacy contact our office and allow 72 hours for refills to be completed.   ? ?Today you received the following chemotherapy and/or immunotherapy agents Carboplatin,Gemcitabine   ?  ?To help prevent nausea and vomiting after your treatment, we encourage you to take your nausea medication as directed. ? ?BELOW ARE SYMPTOMS THAT SHOULD BE REPORTED IMMEDIATELY: ?*FEVER GREATER THAN 100.4 F (38 ?C) OR HIGHER ?*CHILLS OR SWEATING ?*NAUSEA AND VOMITING THAT IS NOT CONTROLLED WITH YOUR NAUSEA MEDICATION ?*UNUSUAL SHORTNESS OF BREATH ?*UNUSUAL BRUISING OR BLEEDING ?*URINARY PROBLEMS (pain or burning when urinating, or frequent urination) ?*BOWEL PROBLEMS (unusual diarrhea, constipation, pain near the anus) ?TENDERNESS IN MOUTH AND THROAT WITH OR WITHOUT PRESENCE OF ULCERS (sore throat, sores in mouth, or a toothache) ?UNUSUAL RASH, SWELLING OR PAIN  ?UNUSUAL VAGINAL DISCHARGE OR ITCHING  ? ?Items with * indicate a potential emergency and should be followed up as soon as possible or go to the Emergency Department if any problems should occur. ? ?Please show the CHEMOTHERAPY ALERT CARD or IMMUNOTHERAPY ALERT CARD at  check-in to the Emergency Department and triage nurse. ? ?Should you have questions after your visit or need to cancel or reschedule your appointment, please contact [facility]  Dept: [phone]  and follow the prompts.  Office hours are 8:00 a.m. to 4:30 p.m. Monday - Friday. Please note that voicemails left after 4:00 p.m. may not be returned until the following business day.  We are closed weekends and major holidays. You have access to a nurse at all times for urgent questions. Please call the main number to the clinic Dept: [phone] and follow the prompts. ? ?For any non-urgent questions, you may also contact your provider using MyChart. We now offer e-Visits for anyone 18 and older to request care online for non-urgent symptoms. For details visit [email or web address]. ?  ?Also download the MyChart app! Go to the app store, search "MyChart", open the app, select , and log in with your MyChart username and password. ? ?Due to Covid, a mask is required upon entering the hospital/clinic. If you do not have a mask, one will be given to you upon arrival. For doctor visits, patients may have 1 support person aged 18 or older with them. For treatment visits, patients cannot have anyone with them due to current Covid guidelines and our immunocompromised population.  ? ? ?

## 2021-04-17 NOTE — Progress Notes (Incomplete)
[facility]  [address] [place],  [place]  [address] [phone]  Clinic Day:  04/17/2021  Referring physician: Greig Right, MD  This document serves as a record of services personally performed by Dequincy Macarthur Critchley, MD. It was created on their behalf by [facility] E, a trained medical scribe. The creation of this record is based on the scribe's personal observations and the provider's statements to them.  HISTORY OF PRESENT ILLNESS:  The patient is a 60 y.o. female with metastatic hormone receptor positive breast cancer, which includes spread of disease to her stomach.  She comes in today to be evaluated before heading into cycle 8 day 17 of carboplatin/gemcitabine.  Based upon fatigue and low counts, her cycles of chemotherapy are now on a day 1,15-q-4-week schedule.  She still has intermittent abdominal pain and nausea, for which she has been more reliant upon pain and antinausea medication recently.  Weakness also remains an issue.  Otherwise, she denies having any new symptoms or findings which concern her for disease progression.  VITALS:  There were no vitals taken for this visit.  Wt Readings from Last 3 Encounters:  04/10/21 119 lb 4 oz (54.1 kg)  04/09/21 116 lb 4.8 oz (52.8 kg)  03/27/21 118 lb 4 oz (53.6 kg)    There is no height or weight on file to calculate BMI.  Performance status (ECOG): 1  PHYSICAL EXAM:  Physical Exam Constitutional:      General: She is not in acute distress.    Appearance: Normal appearance. She is normal weight.  HENT:     Head: Normocephalic and atraumatic.  Eyes:     General: No scleral icterus.    Extraocular Movements: Extraocular movements intact.     Conjunctiva/sclera: Conjunctivae normal.     Pupils: Pupils are equal, round, and reactive to light.  Cardiovascular:     Rate and Rhythm: Normal rate and regular rhythm.     Pulses: Normal pulses.     Heart sounds: Normal heart sounds. No  murmur heard.   No friction rub. No gallop.  Pulmonary:     Effort: Pulmonary effort is normal. No respiratory distress.     Breath sounds: Normal breath sounds.  Abdominal:     General: Bowel sounds are normal. There is no distension.     Palpations: Abdomen is soft. There is no hepatomegaly, splenomegaly or mass.     Tenderness: There is no abdominal tenderness.  Musculoskeletal:        General: Normal range of motion.     Cervical back: Normal range of motion and neck supple.     Right lower leg: No edema.     Left lower leg: No edema.  Lymphadenopathy:     Cervical: No cervical adenopathy.  Skin:    General: Skin is warm and dry.  Neurological:     General: No focal deficit present.     Mental Status: She is alert and oriented to person, place, and time. Mental status is at baseline.  Psychiatric:        Mood and Affect: Mood normal.        Behavior: Behavior normal.        Thought Content: Thought content normal.        Judgment: Judgment normal.    LABS:   CBC Latest Ref Rng & Units 04/09/2021 03/26/2021 03/06/2021  WBC - 7.9 3.5 7.7  Hemoglobin 12.0 - 16.0 10.7(A) 10.1(A) 10.2(A)  Hematocrit 36 -  46 32(A) 31(A) 32(A)  Platelets 150 - 399 236 119(A) 336   CMP Latest Ref Rng & Units 04/09/2021 03/26/2021 03/06/2021  BUN 4 - 21 18 17 18   Creatinine 0.5 - 1.1 1.0 0.9 1.0  Sodium 137 - 147 140 140 140  Potassium 3.4 - 5.3 3.7 3.4 4.0  Chloride 99 - 108 106 104 105  CO2 13 - 22 27(A) 24(A) 26(A)  Calcium 8.7 - 10.7 7.8(A) 8.1(A) 8.0(A)  Total Protein 6.3 - 8.2 g/dL - - -  Alkaline Phos 25 - 125 178(A) 137(A) 171(A)  AST 13 - 35 38(A) 32 34  ALT 7 - 35 24 27 19     ASSESSMENT & PLAN:  Assessment/Plan:  A 60 y.o. female with metastatic hormone positive breast cancer.  She will proceed with cycle 8, day 17 of carboplatin/gemcitabine tomorrow.  Otherwise, I will see her back in 4 weeks before she heads into her 9th cycle of carboplatin/gemcitabine. CT scans will be done  before her next visit to ascertain her new disease baseline after 8 cycles of chemotherapy.  The patient understands the plans discussed today and is in agreement with them.    I, Rita Ohara, am acting as scribe for Marice Potter, MD    I have reviewed this report as typed by the medical scribe, and it is complete and accurate.  Dequincy Macarthur Critchley, MD

## 2021-04-23 ENCOUNTER — Inpatient Hospital Stay: Payer: PPO | Admitting: Oncology

## 2021-04-23 ENCOUNTER — Other Ambulatory Visit: Payer: Self-pay | Admitting: Hematology and Oncology

## 2021-04-23 ENCOUNTER — Inpatient Hospital Stay: Payer: PPO | Attending: Oncology

## 2021-04-23 ENCOUNTER — Other Ambulatory Visit: Payer: Self-pay

## 2021-04-23 ENCOUNTER — Telehealth: Payer: Self-pay | Admitting: Oncology

## 2021-04-23 DIAGNOSIS — Z5189 Encounter for other specified aftercare: Secondary | ICD-10-CM | POA: Insufficient documentation

## 2021-04-23 DIAGNOSIS — D6481 Anemia due to antineoplastic chemotherapy: Secondary | ICD-10-CM | POA: Insufficient documentation

## 2021-04-23 DIAGNOSIS — C50919 Malignant neoplasm of unspecified site of unspecified female breast: Secondary | ICD-10-CM | POA: Insufficient documentation

## 2021-04-23 DIAGNOSIS — Z170 Estrogen receptor positive status [ER+]: Secondary | ICD-10-CM | POA: Insufficient documentation

## 2021-04-23 DIAGNOSIS — Z0000 Encounter for general adult medical examination without abnormal findings: Secondary | ICD-10-CM | POA: Diagnosis not present

## 2021-04-23 DIAGNOSIS — C7889 Secondary malignant neoplasm of other digestive organs: Secondary | ICD-10-CM | POA: Insufficient documentation

## 2021-04-23 DIAGNOSIS — I959 Hypotension, unspecified: Secondary | ICD-10-CM | POA: Insufficient documentation

## 2021-04-23 DIAGNOSIS — C50111 Malignant neoplasm of central portion of right female breast: Secondary | ICD-10-CM | POA: Diagnosis not present

## 2021-04-23 DIAGNOSIS — E785 Hyperlipidemia, unspecified: Secondary | ICD-10-CM | POA: Diagnosis not present

## 2021-04-23 DIAGNOSIS — C785 Secondary malignant neoplasm of large intestine and rectum: Secondary | ICD-10-CM

## 2021-04-23 DIAGNOSIS — Z5111 Encounter for antineoplastic chemotherapy: Secondary | ICD-10-CM | POA: Insufficient documentation

## 2021-04-23 LAB — COMPREHENSIVE METABOLIC PANEL
Albumin: 3.3 — AB (ref 3.5–5.0)
Calcium: 8 — AB (ref 8.7–10.7)

## 2021-04-23 LAB — BASIC METABOLIC PANEL
BUN: 14 (ref 4–21)
CO2: 22 (ref 13–22)
Chloride: 108 (ref 99–108)
Creatinine: 1 (ref 0.5–1.1)
Glucose: 103
Potassium: 3.7 (ref 3.4–5.3)
Sodium: 140 (ref 137–147)

## 2021-04-23 LAB — HEPATIC FUNCTION PANEL
ALT: 35 (ref 7–35)
AST: 41 — AB (ref 13–35)
Alkaline Phosphatase: 123 (ref 25–125)
Bilirubin, Total: 0.3

## 2021-04-23 LAB — CBC: RBC: 2.44 — AB (ref 3.87–5.11)

## 2021-04-23 LAB — CBC AND DIFFERENTIAL
HCT: 27 — AB (ref 36–46)
Hemoglobin: 8.8 — AB (ref 12.0–16.0)
Neutrophils Absolute: 2.35
Platelets: 101 — AB (ref 150–399)
WBC: 4.9

## 2021-04-23 LAB — MAGNESIUM: Magnesium: 1.1

## 2021-04-23 MED FILL — Gemcitabine HCl Inj 1 GM/26.3ML (38 MG/ML) (Base Equiv): INTRAVENOUS | Qty: 35 | Status: AC

## 2021-04-23 NOTE — Telephone Encounter (Signed)
Per Dr Bobby Rumpf, patient was to only have Labs today - Canceled just the Follow Up for today (patient notified)

## 2021-04-24 ENCOUNTER — Inpatient Hospital Stay: Payer: PPO

## 2021-04-24 ENCOUNTER — Encounter: Payer: Self-pay | Admitting: Oncology

## 2021-04-24 VITALS — BP 115/67 | HR 97 | Temp 98.2°F | Resp 18 | Ht 65.5 in | Wt 116.5 lb

## 2021-04-24 DIAGNOSIS — C7889 Secondary malignant neoplasm of other digestive organs: Secondary | ICD-10-CM | POA: Diagnosis not present

## 2021-04-24 DIAGNOSIS — D6481 Anemia due to antineoplastic chemotherapy: Secondary | ICD-10-CM | POA: Diagnosis not present

## 2021-04-24 DIAGNOSIS — C50919 Malignant neoplasm of unspecified site of unspecified female breast: Secondary | ICD-10-CM | POA: Diagnosis not present

## 2021-04-24 DIAGNOSIS — R197 Diarrhea, unspecified: Secondary | ICD-10-CM

## 2021-04-24 DIAGNOSIS — I959 Hypotension, unspecified: Secondary | ICD-10-CM | POA: Diagnosis not present

## 2021-04-24 DIAGNOSIS — Z5111 Encounter for antineoplastic chemotherapy: Secondary | ICD-10-CM | POA: Diagnosis not present

## 2021-04-24 DIAGNOSIS — C50111 Malignant neoplasm of central portion of right female breast: Secondary | ICD-10-CM

## 2021-04-24 DIAGNOSIS — Z170 Estrogen receptor positive status [ER+]: Secondary | ICD-10-CM | POA: Diagnosis not present

## 2021-04-24 DIAGNOSIS — Z5189 Encounter for other specified aftercare: Secondary | ICD-10-CM | POA: Diagnosis not present

## 2021-04-24 MED ORDER — SODIUM CHLORIDE 0.9 % IV SOLN
800.0000 mg/m2 | Freq: Once | INTRAVENOUS | Status: AC
  Administered 2021-04-24: 1330 mg via INTRAVENOUS
  Filled 2021-04-24: qty 26.3

## 2021-04-24 MED ORDER — SODIUM CHLORIDE 0.9 % IV SOLN: Freq: Once | INTRAVENOUS | Status: AC

## 2021-04-24 MED ORDER — OCTREOTIDE ACETATE 30 MG IM KIT
30.0000 mg | PACK | Freq: Once | INTRAMUSCULAR | Status: AC
  Administered 2021-04-24: 30 mg via INTRAMUSCULAR
  Filled 2021-04-24: qty 1

## 2021-04-24 MED ORDER — HEPARIN SOD (PORK) LOCK FLUSH 100 UNIT/ML IV SOLN
500.0000 [IU] | Freq: Once | INTRAVENOUS | Status: AC | PRN
  Administered 2021-04-24: 500 [IU]

## 2021-04-24 MED ORDER — SODIUM CHLORIDE 0.9 % IV SOLN: Freq: Once | INTRAVENOUS | Status: DC

## 2021-04-24 MED ORDER — PALONOSETRON HCL INJECTION 0.25 MG/5ML
0.2500 mg | Freq: Once | INTRAVENOUS | Status: AC
  Administered 2021-04-24: 0.25 mg via INTRAVENOUS
  Filled 2021-04-24: qty 5

## 2021-04-24 MED ORDER — MAGNESIUM SULFATE 2 GM/50ML IV SOLN
2.0000 g | INTRAVENOUS | Status: AC
  Administered 2021-04-24 (×3): 2 g via INTRAVENOUS
  Filled 2021-04-24 (×3): qty 50

## 2021-04-24 MED ORDER — SODIUM CHLORIDE 0.9% FLUSH
10.0000 mL | INTRAVENOUS | Status: DC | PRN
  Administered 2021-04-24: 10 mL

## 2021-04-24 NOTE — Patient Instructions (Signed)
[facility]  Discharge Instructions: Thank you for choosing [facility] to provide your oncology and hematology care.  If you have a lab appointment with the [facility], please go directly to the [facility] and check in at the registration area.   Wear comfortable clothing and clothing appropriate for easy access to any Portacath or PICC line.   We strive to give you quality time with your provider. You may need to reschedule your appointment if you arrive late (15 or more minutes).  Arriving late affects you and other patients whose appointments are after yours.  Also, if you miss three or more appointments without notifying the office, you may be dismissed from the clinic at the provider's discretion.      For prescription refill requests, have your pharmacy contact our office and allow 72 hours for refills to be completed.    Today you received the following chemotherapy and/or immunotherapy agents GemcitabineMagnesium Sulfate Injection What is this medication? MAGNESIUM SULFATE (mag NEE zee um SUL fate) prevents and treats low levels of magnesium in your body. It may also be used to prevent and treat seizures during pregnancy in people with high blood pressure disorders, such as preeclampsia or eclampsia. Magnesium plays an important role in maintaining the health of your muscles and nervous system. This medicine may be used for other purposes; ask your health care provider or pharmacist if you have questions. What should I tell my care team before I take this medication? They need to know if you have any of these conditions: Heart disease History of irregular heart beat Kidney disease An unusual or allergic reaction to magnesium sulfate, medications, foods, dyes, or preservatives Pregnant or trying to get pregnant Breast-feeding How should I use this medication? This medication is for infusion into a vein. It is given in a hospital or clinic  setting. Talk to your care team about the use of this medication in children. While this medication may be prescribed for selected conditions, precautions do apply. Overdosage: If you think you have taken too much of this medicine contact a poison control center or emergency room at once. NOTE: This medicine is only for you. Do not share this medicine with others. What if I miss a dose? This does not apply. What may interact with this medication? Certain medications for anxiety or sleep Certain medications for seizures like phenobarbital Digoxin Medications that relax muscles for surgery Narcotic medications for pain This list may not describe all possible interactions. Give your health care provider a list of all the medicines, herbs, non-prescription drugs, or dietary supplements you use. Also tell them if you smoke, drink alcohol, or use illegal drugs. Some items may interact with your medicine. What should I watch for while using this medication? Your condition will be monitored carefully while you are receiving this medication. You may need blood work done while you are receiving this medication. What side effects may I notice from receiving this medication? Side effects that you should report to your care team as soon as possible: Allergic reactions--skin rash, itching, hives, swelling of the face, lips, tongue, or throat High magnesium level--confusion, drowsiness, facial flushing, redness, sweating, muscle weakness, fast or irregular heartbeat, trouble breathing Low blood pressure--dizziness, feeling faint or lightheaded, blurry vision Side effects that usually do not require medical attention (report to your care team if they continue or are bothersome): Headache Nausea This list may not describe all possible side effects. Call your doctor  for medical advice about side effects. You may report side effects to FDA at 1-800-FDA-1088. Where should I keep my medication? This medication  is given in a hospital or clinic and will not be stored at home. NOTE: This sheet is a summary. It may not cover all possible information. If you have questions about this medicine, talk to your doctor, pharmacist, or health care provider.  2022 Elsevier/Gold Standard (2020-08-21 00:00:00)    To help prevent nausea and vomiting after your treatment, we encourage you to take your nausea medication as directed.  BELOW ARE SYMPTOMS THAT SHOULD BE REPORTED IMMEDIATELY: *FEVER GREATER THAN 100.4 F (38 C) OR HIGHER *CHILLS OR SWEATING *NAUSEA AND VOMITING THAT IS NOT CONTROLLED WITH YOUR NAUSEA MEDICATION *UNUSUAL SHORTNESS OF BREATH *UNUSUAL BRUISING OR BLEEDING *URINARY PROBLEMS (pain or burning when urinating, or frequent urination) *BOWEL PROBLEMS (unusual diarrhea, constipation, pain near the anus) TENDERNESS IN MOUTH AND THROAT WITH OR WITHOUT PRESENCE OF ULCERS (sore throat, sores in mouth, or a toothache) UNUSUAL RASH, SWELLING OR PAIN  UNUSUAL VAGINAL DISCHARGE OR ITCHING   Items with * indicate a potential emergency and should be followed up as soon as possible or go to the Emergency Department if any problems should occur.  Please show the CHEMOTHERAPY ALERT CARD or IMMUNOTHERAPY ALERT CARD at check-in to the Emergency Department and triage nurse.  Should you have questions after your visit or need to cancel or reschedule your appointment, please contact [facility]  Dept: [phone]  and follow the prompts.  Office hours are 8:00 a.m. to 4:30 p.m. Monday - Friday. Please note that voicemails left after 4:00 p.m. may not be returned until the following business day.  We are closed weekends and major holidays. You have access to a nurse at all times for urgent questions. Please call the main number to the clinic Dept: [phone] and follow the prompts.  For any non-urgent questions, you may also contact your provider using MyChart. We now offer e-Visits  for anyone 62 and older to request care online for non-urgent symptoms. For details visit [email or web address].   Also download the MyChart app! Go to the app store, search "MyChart", open the app, select [facility], and log in with your MyChart username and password.  Due to Covid, a mask is required upon entering the hospital/clinic. If you do not have a mask, one will be given to you upon arrival. For doctor visits, patients may have 1 support person aged 34 or older with them. For treatment visits, patients cannot have anyone with them due to current Covid guidelines and our immunocompromised population.

## 2021-04-24 NOTE — Progress Notes (Signed)
1452: PT STABLE AT TIME OF DISCHARGE  

## 2021-04-27 ENCOUNTER — Other Ambulatory Visit: Payer: Self-pay | Admitting: Pharmacist

## 2021-04-27 ENCOUNTER — Ambulatory Visit: Payer: PPO

## 2021-04-27 ENCOUNTER — Inpatient Hospital Stay: Payer: PPO

## 2021-04-27 ENCOUNTER — Other Ambulatory Visit: Payer: Self-pay | Admitting: Hematology and Oncology

## 2021-04-27 ENCOUNTER — Other Ambulatory Visit: Payer: Self-pay

## 2021-04-27 ENCOUNTER — Encounter: Payer: Self-pay | Admitting: Oncology

## 2021-04-27 VITALS — BP 118/63 | HR 77 | Temp 98.1°F | Resp 18 | Ht 65.5 in | Wt 115.8 lb

## 2021-04-27 DIAGNOSIS — Z170 Estrogen receptor positive status [ER+]: Secondary | ICD-10-CM

## 2021-04-27 DIAGNOSIS — Z5111 Encounter for antineoplastic chemotherapy: Secondary | ICD-10-CM | POA: Diagnosis not present

## 2021-04-27 DIAGNOSIS — C50111 Malignant neoplasm of central portion of right female breast: Secondary | ICD-10-CM

## 2021-04-27 MED ORDER — HEPARIN SOD (PORK) LOCK FLUSH 100 UNIT/ML IV SOLN
500.0000 [IU] | Freq: Once | INTRAVENOUS | Status: AC
  Administered 2021-04-27: 500 [IU] via INTRAVENOUS

## 2021-04-27 MED ORDER — PEGFILGRASTIM-JMDB 6 MG/0.6ML ~~LOC~~ SOSY
6.0000 mg | PREFILLED_SYRINGE | Freq: Once | SUBCUTANEOUS | Status: AC
  Administered 2021-04-27: 6 mg via SUBCUTANEOUS
  Filled 2021-04-27: qty 0.6

## 2021-04-27 MED ORDER — SODIUM CHLORIDE 0.9 % IV SOLN: INTRAVENOUS | Status: DC

## 2021-04-27 MED ORDER — SODIUM CHLORIDE (PF) 0.9 % IJ SOLN
10.0000 mL | Freq: Once | INTRAMUSCULAR | Status: AC
  Administered 2021-04-27: 10 mL via INTRAVENOUS

## 2021-04-27 NOTE — Addendum Note (Signed)
Addended by: Juanetta Beets on: 04/27/2021 01:29 PM   Modules accepted: Orders

## 2021-04-27 NOTE — Patient Instructions (Signed)
Pegfilgrastim Injection What is this medication? PEGFILGRASTIM (PEG fil gra stim) lowers the risk of infection in people who are receiving chemotherapy. It works by Building control surveyor make more white blood cells, which protects your body from infection. It may also be used to help people who have been exposed to high doses of radiation. This medicine may be used for other purposes; ask your health care provider or pharmacist if you have questions. COMMON BRAND NAME(S): Rexene Edison, Ziextenzo What should I tell my care team before I take this medication? They need to know if you have any of these conditions: Kidney disease Latex allergy Ongoing radiation therapy Sickle cell disease Skin reactions to acrylic adhesives (On-Body Injector only) An unusual or allergic reaction to pegfilgrastim, filgrastim, other medications, foods, dyes, or preservatives Pregnant or trying to get pregnant Breast-feeding How should I use this medication? This medication is for injection under the skin. If you get this medication at home, you will be taught how to prepare and give the pre-filled syringe or how to use the On-body Injector. Refer to the patient Instructions for Use for detailed instructions. Use exactly as directed. Tell your care team immediately if you suspect that the On-body Injector may not have performed as intended or if you suspect the use of the On-body Injector resulted in a missed or partial dose. It is important that you put your used needles and syringes in a special sharps container. Do not put them in a trash can. If you do not have a sharps container, call your pharmacist or care team to get one. Talk to your care team about the use of this medication in children. While this medication may be prescribed for selected conditions, precautions do apply. Overdosage: If you think you have taken too much of this medicine contact a poison control center or emergency room at  once. NOTE: This medicine is only for you. Do not share this medicine with others. What if I miss a dose? It is important not to miss your dose. Call your care team if you miss your dose. If you miss a dose due to an On-body Injector failure or leakage, a new dose should be administered as soon as possible using a single prefilled syringe for manual use. What may interact with this medication? Interactions have not been studied. This list may not describe all possible interactions. Give your health care provider a list of all the medicines, herbs, non-prescription drugs, or dietary supplements you use. Also tell them if you smoke, drink alcohol, or use illegal drugs. Some items may interact with your medicine. What should I watch for while using this medication? Your condition will be monitored carefully while you are receiving this medication. You may need blood work done while you are taking this medication. Talk to your care team about your risk of cancer. You may be more at risk for certain types of cancer if you take this medication. If you are going to need a MRI, CT scan, or other procedure, tell your care team that you are using this medication (On-Body Injector only). What side effects may I notice from receiving this medication? Side effects that you should report to your care team as soon as possible: Allergic reactions--skin rash, itching, hives, swelling of the face, lips, tongue, or throat Capillary leak syndrome--stomach or muscle pain, unusual weakness or fatigue, feeling faint or lightheaded, decrease in the amount of urine, swelling of the ankles, hands, or feet, trouble breathing High  white blood cell level--fever, fatigue, trouble breathing, night sweats, change in vision, weight loss Inflammation of the aorta--fever, fatigue, back, chest, or stomach pain, severe headache Kidney injury (glomerulonephritis)--decrease in the amount of urine, red or dark brown urine, foamy or  bubbly urine, swelling of the ankles, hands, or feet Shortness of breath or trouble breathing Spleen injury--pain in upper left stomach or shoulder Unusual bruising or bleeding Side effects that usually do not require medical attention (report to your care team if they continue or are bothersome): Bone pain Pain in the hands or feet This list may not describe all possible side effects. Call your doctor for medical advice about side effects. You may report side effects to FDA at 1-800-FDA-1088. Where should I keep my medication? Keep out of the reach of children. If you are using this medication at home, you will be instructed on how to store it. Throw away any unused medication after the expiration date on the label. NOTE: This sheet is a summary. It may not cover all possible information. If you have questions about this medicine, talk to your doctor, pharmacist, or health care provider.  2022 Elsevier/Gold Standard (2021-02-24 00:00:00) Dehydration, Adult Dehydration is condition in which there is not enough water or other fluids in the body. This happens when a person loses more fluids than he or she takes in. Important body parts cannot work right without the right amount of fluids. Any loss of fluids from the body can cause dehydration. Dehydration can be mild, worse, or very bad. It should be treated right away to keep it from getting very bad. What are the causes? This condition may be caused by: Conditions that cause loss of water or other fluids, such as: Watery poop (diarrhea). Vomiting. Sweating a lot. Peeing (urinating) a lot. Not drinking enough fluids, especially when you: Are ill. Are doing things that take a lot of energy to do. Other illnesses and conditions, such as fever or infection. Certain medicines, such as medicines that take extra fluid out of the body (diuretics). Lack of safe drinking water. Not being able to get enough water and food. What increases the  risk? The following factors may make you more likely to develop this condition: Having a long-term (chronic) illness that has not been treated the right way, such as: Diabetes. Heart disease. Kidney disease. Being 78 years of age or older. Having a disability. Living in a place that is high above the ground or sea (high in altitude). The thinner, dried air causes more fluid loss. Doing exercises that put stress on your body for a long time. What are the signs or symptoms? Symptoms of dehydration depend on how bad it is. Mild or worse dehydration Thirst. Dry lips or dry mouth. Feeling dizzy or light-headed, especially when you stand up from sitting. Muscle cramps. Your body making: Dark pee (urine). Pee may be the color of tea. Less pee than normal. Less tears than normal. Headache. Very bad dehydration Changes in skin. Skin may: Be cold to the touch (clammy). Be blotchy or pale. Not go back to normal right after you lightly pinch it and let it go. Little or no tears, pee, or sweat. Changes in vital signs, such as: Fast breathing. Low blood pressure. Weak pulse. Pulse that is more than 100 beats a minute when you are sitting still. Other changes, such as: Feeling very thirsty. Eyes that look hollow (sunken). Cold hands and feet. Being mixed up (confused). Being very tired (lethargic) or having  trouble waking from sleep. Short-term weight loss. Loss of consciousness. How is this treated? Treatment for this condition depends on how bad it is. Treatment should start right away. Do not wait until your condition gets very bad. Very bad dehydration is an emergency. You will need to go to a hospital. Mild or worse dehydration can be treated at home. You may be asked to: Drink more fluids. Drink an oral rehydration solution (ORS). This drink helps get the right amounts of fluids and salts and minerals in the blood (electrolytes). Very bad dehydration can be treated: With fluids  through an IV tube. By getting normal levels of salts and minerals in your blood. This is often done by giving salts and minerals through a tube. The tube is passed through your nose and into your stomach. By treating the root cause. Follow these instructions at home: Oral rehydration solution If told by your doctor, drink an ORS: Make an ORS. Use instructions on the package. Start by drinking small amounts, about  cup (120 mL) every 5-10 minutes. Slowly drink more until you have had the amount that your doctor said to have. Eating and drinking     Drink enough clear fluid to keep your pee pale yellow. If you were told to drink an ORS, finish the ORS first. Then, start slowly drinking other clear fluids. Drink fluids such as: Water. Do not drink only water. Doing that can make the salt (sodium) level in your body get too low. Water from ice chips you suck on. Fruit juice that you have added water to (diluted). Low-calorie sports drinks. Eat foods that have the right amounts of salts and minerals, such as: Bananas. Oranges. Potatoes. Tomatoes. Spinach. Do not drink alcohol. Avoid: Drinks that have a lot of sugar. These include: High-calorie sports drinks. Fruit juice that you did not add water to. Soda. Caffeine. Foods that are greasy or have a lot of fat or sugar. General instructions Take over-the-counter and prescription medicines only as told by your doctor. Do not take salt tablets. Doing that can make the salt level in your body get too high. Return to your normal activities as told by your doctor. Ask your doctor what activities are safe for you. Keep all follow-up visits as told by your doctor. This is important. Contact a doctor if: You have pain in your belly (abdomen) and the pain: Gets worse. Stays in one place. You have a rash. You have a stiff neck. You get angry or annoyed (irritable) more easily than normal. You are more tired or have a harder time waking  than normal. You feel: Weak or dizzy. Very thirsty. Get help right away if you have: Any symptoms of very bad dehydration. Symptoms of vomiting, such as: You cannot eat or drink without vomiting. Your vomiting gets worse or does not go away. Your vomit has blood or green stuff in it. Symptoms that get worse with treatment. A fever. A very bad headache. Problems with peeing or pooping (having a bowel movement), such as: Watery poop that gets worse or does not go away. Blood in your poop (stool). This may cause poop to look black and tarry. Not peeing in 6-8 hours. Peeing only a small amount of very dark pee in 6-8 hours. Trouble breathing. These symptoms may be an emergency. Do not wait to see if the symptoms will go away. Get medical help right away. Call your local emergency services (911 in the [place] Do not drive yourself to the  hospital. Summary Dehydration is a condition in which there is not enough water or other fluids in the body. This happens when a person loses more fluids than he or she takes in. Treatment for this condition depends on how bad it is. Treatment should be started right away. Do not wait until your condition gets very bad. Drink enough clear fluid to keep your pee pale yellow. If you were told to drink an oral rehydration solution (ORS), finish the ORS first. Then, start slowly drinking other clear fluids. Take over-the-counter and prescription medicines only as told by your doctor. Get help right away if you have any symptoms of very bad dehydration. This information is not intended to replace advice given to you by your health care provider. Make sure you discuss any questions you have with your health care provider. Document Revised: 01/18/2019 Document Reviewed: 01/18/2019 Elsevier Patient Education  [address].

## 2021-04-28 ENCOUNTER — Other Ambulatory Visit: Payer: Self-pay | Admitting: Hematology and Oncology

## 2021-04-28 ENCOUNTER — Inpatient Hospital Stay: Payer: PPO

## 2021-04-28 ENCOUNTER — Encounter: Payer: Self-pay | Admitting: Hematology and Oncology

## 2021-04-28 VITALS — BP 98/62 | HR 90 | Temp 97.6°F | Resp 18 | Ht 65.5 in | Wt 118.0 lb

## 2021-04-28 DIAGNOSIS — C50111 Malignant neoplasm of central portion of right female breast: Secondary | ICD-10-CM | POA: Diagnosis not present

## 2021-04-28 DIAGNOSIS — C785 Secondary malignant neoplasm of large intestine and rectum: Secondary | ICD-10-CM

## 2021-04-28 DIAGNOSIS — Z170 Estrogen receptor positive status [ER+]: Secondary | ICD-10-CM

## 2021-04-28 DIAGNOSIS — C7889 Secondary malignant neoplasm of other digestive organs: Secondary | ICD-10-CM

## 2021-04-28 DIAGNOSIS — R197 Diarrhea, unspecified: Secondary | ICD-10-CM

## 2021-04-28 DIAGNOSIS — Z5111 Encounter for antineoplastic chemotherapy: Secondary | ICD-10-CM | POA: Diagnosis not present

## 2021-04-28 LAB — BASIC METABOLIC PANEL
BUN: 20 (ref 4–21)
CO2: 23 — AB (ref 13–22)
Chloride: 107 (ref 99–108)
Creatinine: 1 (ref 0.5–1.1)
Glucose: 103
Potassium: 3.6 (ref 3.4–5.3)
Sodium: 139 (ref 137–147)

## 2021-04-28 LAB — HEPATIC FUNCTION PANEL
ALT: 36 — AB (ref 7–35)
AST: 56 — AB (ref 13–35)
Alkaline Phosphatase: 156 — AB (ref 25–125)
Bilirubin, Total: 0.6

## 2021-04-28 LAB — COMPREHENSIVE METABOLIC PANEL
Albumin: 3.5 (ref 3.5–5.0)
Calcium: 8.9 (ref 8.7–10.7)

## 2021-04-28 MED ORDER — MAGNESIUM SULFATE 4 GM/100ML IV SOLN
4.0000 g | Freq: Once | INTRAVENOUS | Status: AC
  Administered 2021-04-28: 4 g via INTRAVENOUS
  Filled 2021-04-28: qty 100

## 2021-04-28 MED ORDER — SODIUM CHLORIDE 0.9 % IV SOLN: Freq: Once | INTRAVENOUS | Status: AC

## 2021-04-28 MED ORDER — SODIUM CHLORIDE 0.9 % IV SOLN: INTRAVENOUS | Status: DC | PRN

## 2021-04-28 MED ORDER — SODIUM CHLORIDE (PF) 0.9 % IJ SOLN
10.0000 mL | Freq: Once | INTRAMUSCULAR | Status: AC
  Administered 2021-04-28: 10 mL via INTRAVENOUS

## 2021-04-28 MED ORDER — ONDANSETRON HCL 4 MG/2ML IJ SOLN
8.0000 mg | Freq: Once | INTRAMUSCULAR | Status: AC
  Administered 2021-04-28: 8 mg via INTRAVENOUS
  Filled 2021-04-28: qty 4

## 2021-04-28 MED ORDER — HEPARIN SOD (PORK) LOCK FLUSH 100 UNIT/ML IV SOLN
500.0000 [IU] | Freq: Once | INTRAVENOUS | Status: AC
  Administered 2021-04-28: 500 [IU] via INTRAVENOUS

## 2021-04-28 NOTE — Progress Notes (Signed)
1243:PT STABLE AT TIME OF DISCHARGE

## 2021-04-28 NOTE — Addendum Note (Signed)
Addended by: Juanetta Beets on: 04/28/2021 09:35 AM   Modules accepted: Orders

## 2021-04-28 NOTE — Patient Instructions (Signed)
Magnesium Sulfate Injection What is this medication? MAGNESIUM SULFATE (mag NEE zee um SUL fate) prevents and treats low levels of magnesium in your body. It may also be used to prevent and treat seizures during pregnancy in people with high blood pressure disorders, such as preeclampsia or eclampsia. Magnesium plays an important role in maintaining the health of your muscles and nervous system. This medicine may be used for other purposes; ask your health care provider or pharmacist if you have questions. What should I tell my care team before I take this medication? They need to know if you have any of these conditions: Heart disease History of irregular heart beat Kidney disease An unusual or allergic reaction to magnesium sulfate, medications, foods, dyes, or preservatives Pregnant or trying to get pregnant Breast-feeding How should I use this medication? This medication is for infusion into a vein. It is given in a hospital or clinic setting. Talk to your care team about the use of this medication in children. While this medication may be prescribed for selected conditions, precautions do apply. Overdosage: If you think you have taken too much of this medicine contact a poison control center or emergency room at once. NOTE: This medicine is only for you. Do not share this medicine with others. What if I miss a dose? This does not apply. What may interact with this medication? Certain medications for anxiety or sleep Certain medications for seizures like phenobarbital Digoxin Medications that relax muscles for surgery Narcotic medications for pain This list may not describe all possible interactions. Give your health care provider a list of all the medicines, herbs, non-prescription drugs, or dietary supplements you use. Also tell them if you smoke, drink alcohol, or use illegal drugs. Some items may interact with your medicine. What should I watch for while using this medication? Your  condition will be monitored carefully while you are receiving this medication. You may need blood work done while you are receiving this medication. What side effects may I notice from receiving this medication? Side effects that you should report to your care team as soon as possible: Allergic reactions--skin rash, itching, hives, swelling of the face, lips, tongue, or throat High magnesium level--confusion, drowsiness, facial flushing, redness, sweating, muscle weakness, fast or irregular heartbeat, trouble breathing Low blood pressure--dizziness, feeling faint or lightheaded, blurry vision Side effects that usually do not require medical attention (report to your care team if they continue or are bothersome): Headache Nausea This list may not describe all possible side effects. Call your doctor for medical advice about side effects. You may report side effects to FDA at 1-800-FDA-1088. Where should I keep my medication? This medication is given in a hospital or clinic and will not be stored at home. NOTE: This sheet is a summary. It may not cover all possible information. If you have questions about this medicine, talk to your doctor, pharmacist, or health care provider.  2022 Elsevier/Gold Standard (2020-08-21 00:00:00) Dehydration, Adult Dehydration is condition in which there is not enough water or other fluids in the body. This happens when a person loses more fluids than he or she takes in. Important body parts cannot work right without the right amount of fluids. Any loss of fluids from the body can cause dehydration. Dehydration can be mild, worse, or very bad. It should be treated right away to keep it from getting very bad. What are the causes? This condition may be caused by: Conditions that cause loss of water or other fluids,  such as: Watery poop (diarrhea). Vomiting. Sweating a lot. Peeing (urinating) a lot. Not drinking enough fluids, especially when you: Are ill. Are  doing things that take a lot of energy to do. Other illnesses and conditions, such as fever or infection. Certain medicines, such as medicines that take extra fluid out of the body (diuretics). Lack of safe drinking water. Not being able to get enough water and food. What increases the risk? The following factors may make you more likely to develop this condition: Having a long-term (chronic) illness that has not been treated the right way, such as: Diabetes. Heart disease. Kidney disease. Being 31 years of age or older. Having a disability. Living in a place that is high above the ground or sea (high in altitude). The thinner, dried air causes more fluid loss. Doing exercises that put stress on your body for a long time. What are the signs or symptoms? Symptoms of dehydration depend on how bad it is. Mild or worse dehydration Thirst. Dry lips or dry mouth. Feeling dizzy or light-headed, especially when you stand up from sitting. Muscle cramps. Your body making: Dark pee (urine). Pee may be the color of tea. Less pee than normal. Less tears than normal. Headache. Very bad dehydration Changes in skin. Skin may: Be cold to the touch (clammy). Be blotchy or pale. Not go back to normal right after you lightly pinch it and let it go. Little or no tears, pee, or sweat. Changes in vital signs, such as: Fast breathing. Low blood pressure. Weak pulse. Pulse that is more than 100 beats a minute when you are sitting still. Other changes, such as: Feeling very thirsty. Eyes that look hollow (sunken). Cold hands and feet. Being mixed up (confused). Being very tired (lethargic) or having trouble waking from sleep. Short-term weight loss. Loss of consciousness. How is this treated? Treatment for this condition depends on how bad it is. Treatment should start right away. Do not wait until your condition gets very bad. Very bad dehydration is an emergency. You will need to go to a  hospital. Mild or worse dehydration can be treated at home. You may be asked to: Drink more fluids. Drink an oral rehydration solution (ORS). This drink helps get the right amounts of fluids and salts and minerals in the blood (electrolytes). Very bad dehydration can be treated: With fluids through an IV tube. By getting normal levels of salts and minerals in your blood. This is often done by giving salts and minerals through a tube. The tube is passed through your nose and into your stomach. By treating the root cause. Follow these instructions at home: Oral rehydration solution If told by your doctor, drink an ORS: Make an ORS. Use instructions on the package. Start by drinking small amounts, about  cup (120 mL) every 5-10 minutes. Slowly drink more until you have had the amount that your doctor said to have. Eating and drinking     Drink enough clear fluid to keep your pee pale yellow. If you were told to drink an ORS, finish the ORS first. Then, start slowly drinking other clear fluids. Drink fluids such as: Water. Do not drink only water. Doing that can make the salt (sodium) level in your body get too low. Water from ice chips you suck on. Fruit juice that you have added water to (diluted). Low-calorie sports drinks. Eat foods that have the right amounts of salts and minerals, such as: Bananas. Oranges. Potatoes. Tomatoes. Spinach. Do not  drink alcohol. Avoid: Drinks that have a lot of sugar. These include: High-calorie sports drinks. Fruit juice that you did not add water to. Soda. Caffeine. Foods that are greasy or have a lot of fat or sugar. General instructions Take over-the-counter and prescription medicines only as told by your doctor. Do not take salt tablets. Doing that can make the salt level in your body get too high. Return to your normal activities as told by your doctor. Ask your doctor what activities are safe for you. Keep all follow-up visits as told by  your doctor. This is important. Contact a doctor if: You have pain in your belly (abdomen) and the pain: Gets worse. Stays in one place. You have a rash. You have a stiff neck. You get angry or annoyed (irritable) more easily than normal. You are more tired or have a harder time waking than normal. You feel: Weak or dizzy. Very thirsty. Get help right away if you have: Any symptoms of very bad dehydration. Symptoms of vomiting, such as: You cannot eat or drink without vomiting. Your vomiting gets worse or does not go away. Your vomit has blood or green stuff in it. Symptoms that get worse with treatment. A fever. A very bad headache. Problems with peeing or pooping (having a bowel movement), such as: Watery poop that gets worse or does not go away. Blood in your poop (stool). This may cause poop to look black and tarry. Not peeing in 6-8 hours. Peeing only a small amount of very dark pee in 6-8 hours. Trouble breathing. These symptoms may be an emergency. Do not wait to see if the symptoms will go away. Get medical help right away. Call your local emergency services (911 in the [place] Do not drive yourself to the hospital. Summary Dehydration is a condition in which there is not enough water or other fluids in the body. This happens when a person loses more fluids than he or she takes in. Treatment for this condition depends on how bad it is. Treatment should be started right away. Do not wait until your condition gets very bad. Drink enough clear fluid to keep your pee pale yellow. If you were told to drink an oral rehydration solution (ORS), finish the ORS first. Then, start slowly drinking other clear fluids. Take over-the-counter and prescription medicines only as told by your doctor. Get help right away if you have any symptoms of very bad dehydration. This information is not intended to replace advice given to you by your health care provider. Make sure you discuss any  questions you have with your health care provider. Document Revised: 01/18/2019 Document Reviewed: 01/18/2019 Elsevier Patient Education  [employer].

## 2021-04-29 ENCOUNTER — Other Ambulatory Visit: Payer: Self-pay | Admitting: Hematology and Oncology

## 2021-04-29 DIAGNOSIS — T451X5S Adverse effect of antineoplastic and immunosuppressive drugs, sequela: Secondary | ICD-10-CM

## 2021-04-30 ENCOUNTER — Ambulatory Visit: Payer: PPO

## 2021-04-30 ENCOUNTER — Encounter: Payer: Self-pay | Admitting: Oncology

## 2021-04-30 NOTE — Progress Notes (Signed)
[facility]  [address] [place],  [place]  [address] [phone]  Clinic Day:  05/07/2021  Referring physician: Greig Right, MD  This document serves as a record of services personally performed by Kelin Borum Macarthur Critchley, MD. It was created on their behalf by [facility], a trained medical scribe. The creation of this record is based on the scribe's personal observations and the provider's statements to them.  HISTORY OF PRESENT ILLNESS:  The patient is a 60 y.o. female with metastatic hormone receptor positive breast cancer, which includes spread of disease to her stomach.  She comes in today to review her CT scans to ascertain her new disease baseline after 8 cycles of carboplatin/gemcitabine.  Overall, the patient has been doing okay.  Her major issue remains fatigue.   Otherwise, she denies having any new symptoms or findings which concern her for disease progression.  VITALS:  Blood pressure (!) 82/54, pulse (!) 110, temperature 97.8 F (36.6 C), resp. rate 14, height 5' 5.5" (1.664 m), weight 112 lb 12.8 oz (51.2 kg), SpO2 98 %.  Wt Readings from Last 3 Encounters:  05/07/21 112 lb 12.8 oz (51.2 kg)  04/28/21 118 lb (53.5 kg)  04/27/21 115 lb 12 oz (52.5 kg)    Body mass index is 18.49 kg/m.  Performance status (ECOG): 1  PHYSICAL EXAM:  Physical Exam Constitutional:      General: She is not in acute distress.    Appearance: Normal appearance. She is normal weight.  HENT:     Head: Normocephalic and atraumatic.  Eyes:     General: No scleral icterus.    Extraocular Movements: Extraocular movements intact.     Conjunctiva/sclera: Conjunctivae normal.     Pupils: Pupils are equal, round, and reactive to light.  Cardiovascular:     Rate and Rhythm: Regular rhythm. Tachycardia present.     Pulses: Normal pulses.     Heart sounds: Normal heart sounds. No murmur heard.   No friction rub. No gallop.  Pulmonary:     Effort: Pulmonary  effort is normal. No respiratory distress.     Breath sounds: Normal breath sounds.  Abdominal:     General: Bowel sounds are normal. There is no distension.     Palpations: Abdomen is soft. There is no hepatomegaly, splenomegaly or mass.     Tenderness: There is no abdominal tenderness.  Musculoskeletal:        General: Normal range of motion.     Cervical back: Normal range of motion and neck supple.     Right lower leg: No edema.     Left lower leg: No edema.  Lymphadenopathy:     Cervical: No cervical adenopathy.  Skin:    General: Skin is warm and dry.  Neurological:     General: No focal deficit present.     Mental Status: She is alert and oriented to person, place, and time. Mental status is at baseline.  Psychiatric:        Mood and Affect: Mood normal.        Behavior: Behavior normal.        Thought Content: Thought content normal.        Judgment: Judgment normal.   SCANS: Recent CT chest, abdomen and pelvis has revealed the following:  FINDINGS: CT CHEST FINDINGS Cardiovascular: Port in the anterior chest wall with tip in distal SVC. No significant vascular findings. Normal heart size. No pericardial effusion. Mediastinum/Nodes: RIGHT axillary nodal dissection  post RIGHT mastectomy. No axillary or supraclavicular adenopathy. No mediastinal or hilar adenopathy. No pericardial fluid. Esophagus normal. Lungs/Pleura: Several new pulmonary nodules in the LEFT lower lobe in a segmental pattern. Largest nodule measures 18 mm (image 70/series 4). More superior nodule measures 9 mm on image 62. More inferior nodule measures 12 mm image 77. Nodules have mild ground-glass densities intervening. Musculoskeletal: No aggressive osseous lesion.  CT ABDOMEN AND PELVIS FINDINGS Hepatobiliary: Low-attenuation liver. Postcholecystectomy. Pancreas: Pancreas is normal. No ductal dilatation. No pancreatic inflammation. Spleen: Normal spleen Adrenals/urinary tract: Adrenal glands  and kidneys are normal. The ureters and bladder normal. Stomach/Bowel: Stomach normal. The proximal duodenum is fluid-filled with some retained food stuff. The small bowel is dilated. There is evidence of stasis of enteric contents within the small bowel. Small bowel dilated up to 4.3 cm (image 49/series 601/coronal). There is an enteric colonic anastomosis following a RIGHT hemicolectomy. The small bowel dilatation extends centrally to the anastomosis. The colon distal to anastomosis is collapsed. Findings are consistent with chronic partial small bowel obstruction with stasis of enteric contents. Findings are worsened from CT 01/14/2021 and similar to CT 10/20/2020. No pneumatosis or portal venous gas Vascular/Lymphatic: Abdominal aorta is normal caliber with atherosclerotic calcification. There is no retroperitoneal or periportal lymphadenopathy. No pelvic lymphadenopathy. Reproductive: Uterus and adnexa unremarkable. Other: No free air free fluid. No small free fluid the pelvis. Musculoskeletal: No aggressive osseous lesion.  IMPRESSION:  Chest Impression: 1. Several new large pulmonary nodules in the LEFT lower lobe in a segmental pattern. Unusual pattern for metastasis. Favor pulmonary infection. Consider aspiration pneumonitis with this pattern. Recommend clinical correlation, antibiotic therapy, and short CT follow-up. 2. No mediastinal lymphadenopathy.  Abdomen / Pelvis Impression: 1. Small bowel obstruction pattern. This appears to be a chronic problem with stasis of enteric contents noted. Obstruction worsened from CT 01/14/2021 but similar to 10/20/2020. 2. Post RIGHT hemicolectomy anatomy. 3. No evidence of metastasis in the abdomen pelvis. 4. Hepatic steatosis.  LABS:   CBC Latest Ref Rng & Units 05/06/2021 04/23/2021 04/09/2021  WBC - 17.1 4.9 7.9  Hemoglobin 12.0 - 16.0 9.1(A) 8.8(A) 10.7(A)  Hematocrit 36 - 46 28(A) 27(A) 32(A)  Platelets 150 - 399 130(A)  101(A) 236   CMP Latest Ref Rng & Units 05/06/2021 04/28/2021 04/23/2021  BUN 4 - 21 17 20 14   Creatinine 0.5 - 1.1 0.9 1.0 1.0  Sodium 137 - 147 136(A) 139 140  Potassium 3.4 - 5.3 3.5 3.6 3.7  Chloride 99 - 108 101 107 108  CO2 13 - 22 28(A) 23(A) 22  Calcium 8.7 - 10.7 8.9 8.9 8.0(A)  Total Protein 6.3 - 8.2 g/dL - - -  Alkaline Phos 25 - 125 199(A) 156(A) 123  AST 13 - 35 32 56(A) 41(A)  ALT 7 - 35 25 36(A) 35     ASSESSMENT & PLAN:  Assessment/Plan:  A 60 y.o. female with metastatic hormone positive breast cancer.  In clinic today, I went over all of her CT scan images with her, for which she could see that she has no obvious evidence of disease progression.  I definitely believe her left lung nodules are infectious in nature, for which she has already been started on antibiotics to treat.  I am mostly concerned about her level of fatigue, which is likely related to the numerous cycles of palliative chemotherapy she has received.  Based upon this, I do want to give this patient 2 weeks off before committing her for her ninth  cycle of chemotherapy.  I will give her Retacrit 40,000 units tomorrow to bolster her hemoglobin with the hopes it will improve her hemoglobin over these next few weeks to where her fatigue will be lessened.  For now, her ninth cycle of carboplatin/gemcitabine will be delayed until Friday, December 2.  I will see her 4 weeks later before she heads into her 10th cycle of palliative chemotherapy. The patient understands the plans discussed today and is in agreement with them.    I, Rita Ohara, am acting as scribe for Marice Potter, MD    I have reviewed this report as typed by the medical scribe, and it is complete and accurate.  Amandine Covino Macarthur Critchley, MD

## 2021-05-06 ENCOUNTER — Other Ambulatory Visit: Payer: Self-pay | Admitting: Hematology and Oncology

## 2021-05-06 ENCOUNTER — Encounter: Payer: Self-pay | Admitting: Oncology

## 2021-05-06 DIAGNOSIS — J984 Other disorders of lung: Secondary | ICD-10-CM | POA: Diagnosis not present

## 2021-05-06 DIAGNOSIS — R918 Other nonspecific abnormal finding of lung field: Secondary | ICD-10-CM | POA: Diagnosis not present

## 2021-05-06 DIAGNOSIS — C19 Malignant neoplasm of rectosigmoid junction: Secondary | ICD-10-CM | POA: Diagnosis not present

## 2021-05-06 DIAGNOSIS — C189 Malignant neoplasm of colon, unspecified: Secondary | ICD-10-CM | POA: Diagnosis not present

## 2021-05-06 DIAGNOSIS — K760 Fatty (change of) liver, not elsewhere classified: Secondary | ICD-10-CM | POA: Diagnosis not present

## 2021-05-06 DIAGNOSIS — K5939 Other megacolon: Secondary | ICD-10-CM | POA: Diagnosis not present

## 2021-05-06 DIAGNOSIS — J189 Pneumonia, unspecified organism: Secondary | ICD-10-CM | POA: Diagnosis not present

## 2021-05-06 DIAGNOSIS — C50111 Malignant neoplasm of central portion of right female breast: Secondary | ICD-10-CM | POA: Diagnosis not present

## 2021-05-06 DIAGNOSIS — Z853 Personal history of malignant neoplasm of breast: Secondary | ICD-10-CM | POA: Diagnosis not present

## 2021-05-06 DIAGNOSIS — K56609 Unspecified intestinal obstruction, unspecified as to partial versus complete obstruction: Secondary | ICD-10-CM | POA: Diagnosis not present

## 2021-05-06 LAB — BASIC METABOLIC PANEL WITH GFR
BUN: 17 (ref 4–21)
CO2: 28 — AB (ref 13–22)
Chloride: 101 (ref 99–108)
Creatinine: 0.9 (ref 0.5–1.1)
Glucose: 102
Potassium: 3.5 (ref 3.4–5.3)
Sodium: 136 — AB (ref 137–147)

## 2021-05-06 LAB — COMPREHENSIVE METABOLIC PANEL WITH GFR
Albumin: 3 — AB (ref 3.5–5.0)
Calcium: 8.9 (ref 8.7–10.7)

## 2021-05-06 LAB — HEPATIC FUNCTION PANEL
ALT: 25 (ref 7–35)
AST: 32 (ref 13–35)
Alkaline Phosphatase: 199 — AB (ref 25–125)
Bilirubin, Total: 0.3

## 2021-05-06 LAB — CBC AND DIFFERENTIAL
HCT: 28 — AB (ref 36–46)
Hemoglobin: 9.1 — AB (ref 12.0–16.0)
Neutrophils Absolute: 14.02
Platelets: 130 — AB (ref 150–399)
WBC: 17.1

## 2021-05-06 LAB — CBC: RBC: 2.45 — AB (ref 3.87–5.11)

## 2021-05-06 MED ORDER — LEVOFLOXACIN 750 MG PO TABS: 750.0000 mg | ORAL_TABLET | Freq: Every day | ORAL | 0 refills | Status: DC

## 2021-05-06 NOTE — Progress Notes (Signed)
Faxed in re-enrollment application for free Sandostatin, 2023.

## 2021-05-07 ENCOUNTER — Inpatient Hospital Stay (INDEPENDENT_AMBULATORY_CARE_PROVIDER_SITE_OTHER): Payer: PPO | Admitting: Oncology

## 2021-05-07 ENCOUNTER — Encounter: Payer: Self-pay | Admitting: Oncology

## 2021-05-07 VITALS — BP 82/54 | HR 110 | Temp 97.8°F | Resp 14 | Ht 65.5 in | Wt 112.8 lb

## 2021-05-07 DIAGNOSIS — Z171 Estrogen receptor negative status [ER-]: Secondary | ICD-10-CM | POA: Diagnosis not present

## 2021-05-07 DIAGNOSIS — C50111 Malignant neoplasm of central portion of right female breast: Secondary | ICD-10-CM

## 2021-05-07 DIAGNOSIS — C785 Secondary malignant neoplasm of large intestine and rectum: Secondary | ICD-10-CM

## 2021-05-07 DIAGNOSIS — C7889 Secondary malignant neoplasm of other digestive organs: Secondary | ICD-10-CM | POA: Diagnosis not present

## 2021-05-07 NOTE — Addendum Note (Signed)
Addended by: Juanetta Beets on: 05/07/2021 11:15 AM   Modules accepted: Orders

## 2021-05-08 ENCOUNTER — Inpatient Hospital Stay: Payer: PPO

## 2021-05-08 ENCOUNTER — Other Ambulatory Visit: Payer: Self-pay

## 2021-05-08 ENCOUNTER — Other Ambulatory Visit: Payer: Self-pay | Admitting: Hematology and Oncology

## 2021-05-08 VITALS — BP 119/70 | HR 99 | Temp 97.7°F | Resp 18 | Ht 65.5 in | Wt 114.8 lb

## 2021-05-08 DIAGNOSIS — Z5111 Encounter for antineoplastic chemotherapy: Secondary | ICD-10-CM | POA: Diagnosis not present

## 2021-05-08 DIAGNOSIS — R197 Diarrhea, unspecified: Secondary | ICD-10-CM

## 2021-05-08 MED ORDER — SODIUM CHLORIDE 0.9 % IV SOLN: INTRAVENOUS | Status: DC

## 2021-05-08 MED ORDER — SODIUM CHLORIDE 0.9% FLUSH: 3.0000 mL | Freq: Once | INTRAVENOUS | Status: DC | PRN

## 2021-05-08 MED ORDER — SODIUM CHLORIDE 0.9% FLUSH
10.0000 mL | Freq: Once | INTRAVENOUS | Status: AC | PRN
  Administered 2021-05-08: 10 mL

## 2021-05-08 MED ORDER — ALTEPLASE 2 MG IJ SOLR: 2.0000 mg | Freq: Once | INTRAMUSCULAR | Status: DC | PRN

## 2021-05-08 MED ORDER — SODIUM CHLORIDE 0.9 % IV SOLN: Freq: Once | INTRAVENOUS | Status: AC

## 2021-05-08 MED ORDER — HEPARIN SOD (PORK) LOCK FLUSH 100 UNIT/ML IV SOLN
500.0000 [IU] | Freq: Once | INTRAVENOUS | Status: AC | PRN
  Administered 2021-05-08: 500 [IU]

## 2021-05-08 MED ORDER — EPOETIN ALFA-EPBX 40000 UNIT/ML IJ SOLN
40000.0000 [IU] | Freq: Once | INTRAMUSCULAR | Status: AC
  Administered 2021-05-08: 40000 [IU] via SUBCUTANEOUS
  Filled 2021-05-08: qty 1

## 2021-05-08 MED ORDER — HEPARIN SOD (PORK) LOCK FLUSH 100 UNIT/ML IV SOLN: 250.0000 [IU] | Freq: Once | INTRAVENOUS | Status: DC | PRN

## 2021-05-08 MED ORDER — ONDANSETRON HCL 4 MG/2ML IJ SOLN
4.0000 mg | Freq: Once | INTRAMUSCULAR | Status: AC
  Administered 2021-05-08: 4 mg via INTRAVENOUS
  Filled 2021-05-08: qty 2

## 2021-05-08 NOTE — Patient Instructions (Addendum)
Epoetin Alfa injection What is this medication? EPOETIN ALFA (e POE e tin AL fa) helps your body make more red blood cells. This medicine is used to treat anemia caused by chronic kidney disease, cancer chemotherapy, or HIV-therapy. It may also be used before surgery if you have anemia. This medicine may be used for other purposes; ask your health care provider or pharmacist if you have questions. COMMON BRAND NAME(S): Epogen, Procrit, Retacrit What should I tell my care team before I take this medication? They need to know if you have any of these conditions: cancer heart disease high blood pressure history of blood clots history of stroke low levels of folate, iron, or vitamin B12 in the blood seizures an unusual or allergic reaction to erythropoietin, albumin, benzyl alcohol, hamster proteins, other medicines, foods, dyes, or preservatives pregnant or trying to get pregnant breast-feeding How should I use this medication? This medicine is for injection into a vein or under the skin. It is usually given by a health care professional in a hospital or clinic setting. If you get this medicine at home, you will be taught how to prepare and give this medicine. Use exactly as directed. Take your medicine at regular intervals. Do not take your medicine more often than directed. It is important that you put your used needles and syringes in a special sharps container. Do not put them in a trash can. If you do not have a sharps container, call your pharmacist or healthcare provider to get one. A special MedGuide will be given to you by the pharmacist with each prescription and refill. Be sure to read this information carefully each time. Talk to your pediatrician regarding the use of this medicine in children. While this drug may be prescribed for selected conditions, precautions do apply. Overdosage: If you think you have taken too much of this medicine contact a poison control center or emergency  room at once. NOTE: This medicine is only for you. Do not share this medicine with others. What if I miss a dose? If you miss a dose, take it as soon as you can. If it is almost time for your next dose, take only that dose. Do not take double or extra doses. What may interact with this medication? Interactions have not been studied. This list may not describe all possible interactions. Give your health care provider a list of all the medicines, herbs, non-prescription drugs, or dietary supplements you use. Also tell them if you smoke, drink alcohol, or use illegal drugs. Some items may interact with your medicine. What should I watch for while using this medication? Your condition will be monitored carefully while you are receiving this medicine. You may need blood work done while you are taking this medicine. This medicine may cause a decrease in vitamin B6. You should make sure that you get enough vitamin B6 while you are taking this medicine. Discuss the foods you eat and the vitamins you take with your health care professional. What side effects may I notice from receiving this medication? Side effects that you should report to your doctor or health care professional as soon as possible: allergic reactions like skin rash, itching or hives, swelling of the face, lips, or tongue seizures signs and symptoms of a blood clot such as breathing problems; changes in vision; chest pain; severe, sudden headache; pain, swelling, warmth in the leg; trouble speaking; sudden numbness or weakness of the face, arm or leg signs and symptoms of a stroke like   changes in vision; confusion; trouble speaking or understanding; severe headaches; sudden numbness or weakness of the face, arm or leg; trouble walking; dizziness; loss of balance or coordination Side effects that usually do not require medical attention (report to your doctor or health care professional if they continue or are  bothersome): chills cough dizziness fever headaches joint pain muscle cramps muscle pain nausea, vomiting pain, redness, or irritation at site where injected This list may not describe all possible side effects. Call your doctor for medical advice about side effects. You may report side effects to FDA at 1-800-FDA-1088. Where should I keep my medication? Keep out of the reach of children. Store in a refrigerator between 2 and 8 degrees C (36 and 46 degrees F). Do not freeze or shake. Throw away any unused portion if using a single-dose vial. Multi-dose vials can be kept in the refrigerator for up to 21 days after the initial dose. Throw away unused medicine. NOTE: This sheet is a summary. It may not cover all possible information. If you have questions about this medicine, talk to your doctor, pharmacist, or health care provider.  2022 Elsevier/Gold Standard (2017-02-08 00:00:00) Dehydration, Adult Dehydration is a condition in which there is not enough water or other fluids in the body. This happens when a person loses more fluids than he or she takes in. Important organs, such as the kidneys, brain, and heart, cannot function without a proper amount of fluids. Any loss of fluids from the body can lead to dehydration. Dehydration can be mild, moderate, or severe. It should be treated right away to prevent it from becoming severe. What are the causes? Dehydration may be caused by: Conditions that cause loss of water or other fluids, such as diarrhea, vomiting, or sweating or urinating a lot. Not drinking enough fluids, especially when you are ill or doing activities that require a lot of energy. Other illnesses and conditions, such as fever or infection. Certain medicines, such as medicines that remove excess fluid from the body (diuretics). Lack of safe drinking water. Not being able to get enough water and food. What increases the risk? The following factors may make you more likely  to develop this condition: Having a long-term (chronic) illness that has not been treated properly, such as diabetes, heart disease, or kidney disease. Being 42 years of age or older. Having a disability. Living in a place that is high in altitude, where thinner, drier air causes more fluid loss. Doing exercises that put stress on your body for a long time (endurance sports). What are the signs or symptoms? Symptoms of dehydration depend on how severe it is. Mild or moderate dehydration Thirst. Dry lips or dry mouth. Dizziness or light-headedness, especially when standing up from a seated position. Muscle cramps. Dark urine. Urine may be the color of tea. Less urine or tears produced than usual. Headache. Severe dehydration Changes in skin. Your skin may be cold and clammy, blotchy, or pale. Your skin also may not return to normal after being lightly pinched and released. Little or no tears, urine, or sweat. Changes in vital signs, such as rapid breathing and low blood pressure. Your pulse may be weak or may be faster than 100 beats a minute when you are sitting still. Other changes, such as: Feeling very thirsty. Sunken eyes. Cold hands and feet. Confusion. Being very tired (lethargic) or having trouble waking from sleep. Short-term weight loss. Loss of consciousness. How is this diagnosed? This condition is diagnosed based on  your symptoms and a physical exam. You may have blood and urine tests to help confirm the diagnosis. How is this treated? Treatment for this condition depends on how severe it is. Treatment should be started right away. Do not wait until dehydration becomes severe. Severe dehydration is an emergency and needs to be treated in a hospital. Mild or moderate dehydration can be treated at home. You may be asked to: Drink more fluids. Drink an oral rehydration solution (ORS). This drink helps restore proper amounts of fluids and salts and minerals in the blood  (electrolytes). Severe dehydration can be treated: With IV fluids. By correcting abnormal levels of electrolytes. This is often done by giving electrolytes through a tube that is passed through your nose and into your stomach (nasogastric tube, or NG tube). By treating the underlying cause of dehydration. Follow these instructions at home: Oral rehydration solution If told by your health care provider, drink an ORS: Make an ORS by following instructions on the package. Start by drinking small amounts, about  cup (120 mL) every 5-10 minutes. Slowly increase how much you drink until you have taken the amount recommended by your health care provider. Eating and drinking     Drink enough clear fluid to keep your urine pale yellow. If you were told to drink an ORS, finish the ORS first and then start slowly drinking other clear fluids. Drink fluids such as: Water. Do not drink only water. Doing that can lead to hyponatremia, which is having too little salt (sodium) in the body. Water from ice chips you suck on. Fruit juice that you have added water to (diluted fruit juice). Low-calorie sports drinks. Eat foods that contain a healthy balance of electrolytes, such as bananas, oranges, potatoes, tomatoes, and spinach. Do not drink alcohol. Avoid the following: Drinks that contain a lot of sugar. These include high-calorie sports drinks, fruit juice that is not diluted, and soda. Caffeine. Foods that are greasy or contain a lot of fat or sugar. General instructions Take over-the-counter and prescription medicines only as told by your health care provider. Do not take sodium tablets. Doing that can lead to having too much sodium in the body (hypernatremia). Return to your normal activities as told by your health care provider. Ask your health care provider what activities are safe for you. Keep all follow-up visits as told by your health care provider. This is important. Contact a health care  provider if: You have muscle cramps, pain, or discomfort, such as: Pain in your abdomen and the pain gets worse or stays in one area (localizes). Stiff neck. You have a rash. You are more irritable than usual. You are sleepier or have a harder time waking than usual. You feel weak or dizzy. You feel very thirsty. Get help right away if you have: Any symptoms of severe dehydration. Symptoms of vomiting, such as: You cannot eat or drink without vomiting. Vomiting gets worse or does not go away. Vomit includes blood or green matter (bile). Symptoms that get worse with treatment. A fever. A severe headache. Problems with urination or bowel movements, such as: Diarrhea that gets worse or does not go away. Blood in your stool (feces). This may cause stool to look black and tarry. Not urinating, or urinating only a small amount of very dark urine, within 6-8 hours. Trouble breathing. These symptoms may represent a serious problem that is an emergency. Do not wait to see if the symptoms will go away. Get medical help right  away. Call your local emergency services (911 in the [place] Do not drive yourself to the hospital. Summary Dehydration is a condition in which there is not enough water or other fluids in the body. This happens when a person loses more fluids than he or she takes in. Treatment for this condition depends on how severe it is. Treatment should be started right away. Do not wait until dehydration becomes severe. Drink enough clear fluid to keep your urine pale yellow. If you were told to drink an oral rehydration solution (ORS), finish the ORS first and then start slowly drinking other clear fluids. Take over-the-counter and prescription medicines only as told by your health care provider. Get help right away if you have any symptoms of severe dehydration. This information is not intended to replace advice given to you by your health care provider. Make sure you discuss any  questions you have with your health care provider. Document Revised: 01/18/2019 Document Reviewed: 01/18/2019 Elsevier Patient Education  [employer].

## 2021-05-08 NOTE — Progress Notes (Signed)
1115 PT STABLE AT TIME OF DISCHARGE. 

## 2021-05-11 ENCOUNTER — Telehealth: Payer: Self-pay

## 2021-05-11 DIAGNOSIS — D649 Anemia, unspecified: Secondary | ICD-10-CM | POA: Diagnosis not present

## 2021-05-11 DIAGNOSIS — R1110 Vomiting, unspecified: Secondary | ICD-10-CM | POA: Diagnosis not present

## 2021-05-11 DIAGNOSIS — E7800 Pure hypercholesterolemia, unspecified: Secondary | ICD-10-CM | POA: Diagnosis not present

## 2021-05-11 DIAGNOSIS — Z6820 Body mass index (BMI) 20.0-20.9, adult: Secondary | ICD-10-CM | POA: Diagnosis not present

## 2021-05-11 DIAGNOSIS — Z79899 Other long term (current) drug therapy: Secondary | ICD-10-CM | POA: Diagnosis not present

## 2021-05-11 DIAGNOSIS — K56600 Partial intestinal obstruction, unspecified as to cause: Secondary | ICD-10-CM | POA: Diagnosis not present

## 2021-05-11 DIAGNOSIS — K760 Fatty (change of) liver, not elsewhere classified: Secondary | ICD-10-CM | POA: Diagnosis not present

## 2021-05-11 DIAGNOSIS — Z87891 Personal history of nicotine dependence: Secondary | ICD-10-CM | POA: Diagnosis not present

## 2021-05-11 DIAGNOSIS — N179 Acute kidney failure, unspecified: Secondary | ICD-10-CM | POA: Diagnosis not present

## 2021-05-11 DIAGNOSIS — R112 Nausea with vomiting, unspecified: Secondary | ICD-10-CM | POA: Diagnosis not present

## 2021-05-11 DIAGNOSIS — E43 Unspecified severe protein-calorie malnutrition: Secondary | ICD-10-CM | POA: Diagnosis not present

## 2021-05-11 DIAGNOSIS — K591 Functional diarrhea: Secondary | ICD-10-CM | POA: Diagnosis not present

## 2021-05-11 DIAGNOSIS — R7989 Other specified abnormal findings of blood chemistry: Secondary | ICD-10-CM | POA: Diagnosis not present

## 2021-05-11 DIAGNOSIS — C50919 Malignant neoplasm of unspecified site of unspecified female breast: Secondary | ICD-10-CM | POA: Diagnosis not present

## 2021-05-11 DIAGNOSIS — E860 Dehydration: Secondary | ICD-10-CM | POA: Diagnosis not present

## 2021-05-11 DIAGNOSIS — R6520 Severe sepsis without septic shock: Secondary | ICD-10-CM | POA: Diagnosis not present

## 2021-05-11 DIAGNOSIS — Z8589 Personal history of malignant neoplasm of other organs and systems: Secondary | ICD-10-CM | POA: Diagnosis not present

## 2021-05-11 DIAGNOSIS — K5669 Other partial intestinal obstruction: Secondary | ICD-10-CM | POA: Diagnosis not present

## 2021-05-11 DIAGNOSIS — C7889 Secondary malignant neoplasm of other digestive organs: Secondary | ICD-10-CM | POA: Diagnosis not present

## 2021-05-11 DIAGNOSIS — A419 Sepsis, unspecified organism: Secondary | ICD-10-CM | POA: Diagnosis not present

## 2021-05-11 NOTE — Telephone Encounter (Addendum)
Anjannette Gauger,RN: I called pt back, and told her the providers recommendation is for her to be seen in the ED. Pt verbalized understanding.  Kelli Mosher,PA: Or if she is that acutely ill maybe she should go to the ER instead of the other one?  Alexsia Klindt,RN: Pt called to report that she has been vomiting for 3 days, color is green. She has been unable to keep fluids down. She tried Powerade but camy back up. She does take her nausea med @ night (Zyprexa), as well as zofran ODT without relief. Diarrhea twice yesterday-looked pretty much like water. Afebrile.   I sent above message to [place].

## 2021-05-12 DIAGNOSIS — E43 Unspecified severe protein-calorie malnutrition: Secondary | ICD-10-CM | POA: Diagnosis not present

## 2021-05-12 DIAGNOSIS — A419 Sepsis, unspecified organism: Secondary | ICD-10-CM | POA: Diagnosis not present

## 2021-05-12 DIAGNOSIS — N179 Acute kidney failure, unspecified: Secondary | ICD-10-CM | POA: Diagnosis not present

## 2021-05-13 DIAGNOSIS — E43 Unspecified severe protein-calorie malnutrition: Secondary | ICD-10-CM | POA: Diagnosis not present

## 2021-05-13 DIAGNOSIS — A419 Sepsis, unspecified organism: Secondary | ICD-10-CM | POA: Diagnosis not present

## 2021-05-13 DIAGNOSIS — N179 Acute kidney failure, unspecified: Secondary | ICD-10-CM | POA: Diagnosis not present

## 2021-05-13 DIAGNOSIS — K5989 Other specified functional intestinal disorders: Secondary | ICD-10-CM | POA: Diagnosis not present

## 2021-05-13 DIAGNOSIS — Z9049 Acquired absence of other specified parts of digestive tract: Secondary | ICD-10-CM | POA: Diagnosis not present

## 2021-05-13 DIAGNOSIS — K56600 Partial intestinal obstruction, unspecified as to cause: Secondary | ICD-10-CM | POA: Diagnosis not present

## 2021-05-14 DIAGNOSIS — A419 Sepsis, unspecified organism: Secondary | ICD-10-CM | POA: Diagnosis not present

## 2021-05-14 DIAGNOSIS — E43 Unspecified severe protein-calorie malnutrition: Secondary | ICD-10-CM | POA: Diagnosis not present

## 2021-05-14 DIAGNOSIS — N179 Acute kidney failure, unspecified: Secondary | ICD-10-CM | POA: Diagnosis not present

## 2021-05-14 DIAGNOSIS — K56600 Partial intestinal obstruction, unspecified as to cause: Secondary | ICD-10-CM | POA: Diagnosis not present

## 2021-05-15 DIAGNOSIS — E43 Unspecified severe protein-calorie malnutrition: Secondary | ICD-10-CM | POA: Diagnosis not present

## 2021-05-15 DIAGNOSIS — K56600 Partial intestinal obstruction, unspecified as to cause: Secondary | ICD-10-CM | POA: Diagnosis not present

## 2021-05-15 DIAGNOSIS — N179 Acute kidney failure, unspecified: Secondary | ICD-10-CM | POA: Diagnosis not present

## 2021-05-15 DIAGNOSIS — A419 Sepsis, unspecified organism: Secondary | ICD-10-CM | POA: Diagnosis not present

## 2021-05-16 DIAGNOSIS — N179 Acute kidney failure, unspecified: Secondary | ICD-10-CM | POA: Diagnosis not present

## 2021-05-16 DIAGNOSIS — E43 Unspecified severe protein-calorie malnutrition: Secondary | ICD-10-CM | POA: Diagnosis not present

## 2021-05-16 DIAGNOSIS — A419 Sepsis, unspecified organism: Secondary | ICD-10-CM | POA: Diagnosis not present

## 2021-05-16 DIAGNOSIS — K56600 Partial intestinal obstruction, unspecified as to cause: Secondary | ICD-10-CM | POA: Diagnosis not present

## 2021-05-17 DIAGNOSIS — A419 Sepsis, unspecified organism: Secondary | ICD-10-CM | POA: Diagnosis not present

## 2021-05-17 DIAGNOSIS — E43 Unspecified severe protein-calorie malnutrition: Secondary | ICD-10-CM | POA: Diagnosis not present

## 2021-05-17 DIAGNOSIS — K56600 Partial intestinal obstruction, unspecified as to cause: Secondary | ICD-10-CM | POA: Diagnosis not present

## 2021-05-17 DIAGNOSIS — N179 Acute kidney failure, unspecified: Secondary | ICD-10-CM | POA: Diagnosis not present

## 2021-05-18 DIAGNOSIS — K6389 Other specified diseases of intestine: Secondary | ICD-10-CM | POA: Diagnosis not present

## 2021-05-18 DIAGNOSIS — K56609 Unspecified intestinal obstruction, unspecified as to partial versus complete obstruction: Secondary | ICD-10-CM | POA: Diagnosis not present

## 2021-05-18 DIAGNOSIS — K56600 Partial intestinal obstruction, unspecified as to cause: Secondary | ICD-10-CM | POA: Diagnosis not present

## 2021-05-18 DIAGNOSIS — A419 Sepsis, unspecified organism: Secondary | ICD-10-CM | POA: Diagnosis not present

## 2021-05-18 DIAGNOSIS — E43 Unspecified severe protein-calorie malnutrition: Secondary | ICD-10-CM | POA: Diagnosis not present

## 2021-05-18 DIAGNOSIS — N179 Acute kidney failure, unspecified: Secondary | ICD-10-CM | POA: Diagnosis not present

## 2021-05-19 ENCOUNTER — Telehealth: Payer: Self-pay | Admitting: Oncology

## 2021-05-19 DIAGNOSIS — A419 Sepsis, unspecified organism: Secondary | ICD-10-CM | POA: Diagnosis not present

## 2021-05-19 DIAGNOSIS — E43 Unspecified severe protein-calorie malnutrition: Secondary | ICD-10-CM | POA: Diagnosis not present

## 2021-05-19 DIAGNOSIS — N179 Acute kidney failure, unspecified: Secondary | ICD-10-CM | POA: Diagnosis not present

## 2021-05-19 NOTE — Telephone Encounter (Signed)
Patient Canceled 12/2 Infusion due to being Inpatient

## 2021-05-20 ENCOUNTER — Encounter: Payer: Self-pay | Admitting: Oncology

## 2021-05-20 ENCOUNTER — Telehealth: Payer: Self-pay | Admitting: Oncology

## 2021-05-20 DIAGNOSIS — C7880 Secondary malignant neoplasm of unspecified digestive organ: Secondary | ICD-10-CM | POA: Diagnosis not present

## 2021-05-20 DIAGNOSIS — N179 Acute kidney failure, unspecified: Secondary | ICD-10-CM | POA: Diagnosis not present

## 2021-05-20 DIAGNOSIS — E43 Unspecified severe protein-calorie malnutrition: Secondary | ICD-10-CM | POA: Diagnosis not present

## 2021-05-20 DIAGNOSIS — Z853 Personal history of malignant neoplasm of breast: Secondary | ICD-10-CM | POA: Diagnosis not present

## 2021-05-20 DIAGNOSIS — K56600 Partial intestinal obstruction, unspecified as to cause: Secondary | ICD-10-CM | POA: Diagnosis not present

## 2021-05-20 DIAGNOSIS — Z79891 Long term (current) use of opiate analgesic: Secondary | ICD-10-CM | POA: Diagnosis not present

## 2021-05-20 DIAGNOSIS — A419 Sepsis, unspecified organism: Secondary | ICD-10-CM | POA: Diagnosis not present

## 2021-05-20 NOTE — Telephone Encounter (Signed)
Per 11/17 LOS, patient's scheduled adjusted to request

## 2021-05-21 ENCOUNTER — Encounter: Payer: Self-pay | Admitting: Oncology

## 2021-05-21 DIAGNOSIS — N179 Acute kidney failure, unspecified: Secondary | ICD-10-CM | POA: Diagnosis not present

## 2021-05-21 DIAGNOSIS — A419 Sepsis, unspecified organism: Secondary | ICD-10-CM | POA: Diagnosis not present

## 2021-05-21 DIAGNOSIS — E43 Unspecified severe protein-calorie malnutrition: Secondary | ICD-10-CM | POA: Diagnosis not present

## 2021-05-22 ENCOUNTER — Ambulatory Visit: Payer: PPO

## 2021-05-22 DIAGNOSIS — A419 Sepsis, unspecified organism: Secondary | ICD-10-CM | POA: Diagnosis not present

## 2021-05-22 DIAGNOSIS — E43 Unspecified severe protein-calorie malnutrition: Secondary | ICD-10-CM | POA: Diagnosis not present

## 2021-05-22 DIAGNOSIS — N179 Acute kidney failure, unspecified: Secondary | ICD-10-CM | POA: Diagnosis not present

## 2021-05-23 DIAGNOSIS — N179 Acute kidney failure, unspecified: Secondary | ICD-10-CM | POA: Diagnosis not present

## 2021-05-23 DIAGNOSIS — E43 Unspecified severe protein-calorie malnutrition: Secondary | ICD-10-CM | POA: Diagnosis not present

## 2021-05-23 DIAGNOSIS — A419 Sepsis, unspecified organism: Secondary | ICD-10-CM | POA: Diagnosis not present

## 2021-05-24 DIAGNOSIS — A419 Sepsis, unspecified organism: Secondary | ICD-10-CM | POA: Diagnosis not present

## 2021-05-24 DIAGNOSIS — N179 Acute kidney failure, unspecified: Secondary | ICD-10-CM | POA: Diagnosis not present

## 2021-05-24 DIAGNOSIS — E43 Unspecified severe protein-calorie malnutrition: Secondary | ICD-10-CM | POA: Diagnosis not present

## 2021-05-25 DIAGNOSIS — E43 Unspecified severe protein-calorie malnutrition: Secondary | ICD-10-CM | POA: Diagnosis not present

## 2021-05-25 DIAGNOSIS — A419 Sepsis, unspecified organism: Secondary | ICD-10-CM | POA: Diagnosis not present

## 2021-05-25 DIAGNOSIS — N179 Acute kidney failure, unspecified: Secondary | ICD-10-CM | POA: Diagnosis not present

## 2021-05-26 DIAGNOSIS — E43 Unspecified severe protein-calorie malnutrition: Secondary | ICD-10-CM | POA: Diagnosis not present

## 2021-05-26 DIAGNOSIS — A419 Sepsis, unspecified organism: Secondary | ICD-10-CM | POA: Diagnosis not present

## 2021-05-26 DIAGNOSIS — N179 Acute kidney failure, unspecified: Secondary | ICD-10-CM | POA: Diagnosis not present

## 2021-05-27 DIAGNOSIS — A419 Sepsis, unspecified organism: Secondary | ICD-10-CM | POA: Diagnosis not present

## 2021-05-27 DIAGNOSIS — N179 Acute kidney failure, unspecified: Secondary | ICD-10-CM | POA: Diagnosis not present

## 2021-05-27 DIAGNOSIS — E43 Unspecified severe protein-calorie malnutrition: Secondary | ICD-10-CM | POA: Diagnosis not present

## 2021-05-28 ENCOUNTER — Other Ambulatory Visit: Payer: Self-pay | Admitting: Pharmacist

## 2021-05-28 DIAGNOSIS — A419 Sepsis, unspecified organism: Secondary | ICD-10-CM | POA: Diagnosis not present

## 2021-05-28 DIAGNOSIS — N179 Acute kidney failure, unspecified: Secondary | ICD-10-CM | POA: Diagnosis not present

## 2021-05-28 DIAGNOSIS — E43 Unspecified severe protein-calorie malnutrition: Secondary | ICD-10-CM | POA: Diagnosis not present

## 2021-05-29 ENCOUNTER — Encounter: Payer: Self-pay | Admitting: Oncology

## 2021-05-29 DIAGNOSIS — E7800 Pure hypercholesterolemia, unspecified: Secondary | ICD-10-CM | POA: Diagnosis not present

## 2021-05-29 DIAGNOSIS — C50919 Malignant neoplasm of unspecified site of unspecified female breast: Secondary | ICD-10-CM | POA: Diagnosis not present

## 2021-05-29 DIAGNOSIS — Z48815 Encounter for surgical aftercare following surgery on the digestive system: Secondary | ICD-10-CM | POA: Diagnosis not present

## 2021-05-29 DIAGNOSIS — Z87440 Personal history of urinary (tract) infections: Secondary | ICD-10-CM | POA: Diagnosis not present

## 2021-05-29 DIAGNOSIS — Z466 Encounter for fitting and adjustment of urinary device: Secondary | ICD-10-CM | POA: Diagnosis not present

## 2021-05-29 DIAGNOSIS — Z483 Aftercare following surgery for neoplasm: Secondary | ICD-10-CM | POA: Diagnosis not present

## 2021-05-29 DIAGNOSIS — N179 Acute kidney failure, unspecified: Secondary | ICD-10-CM | POA: Diagnosis not present

## 2021-05-29 DIAGNOSIS — M1990 Unspecified osteoarthritis, unspecified site: Secondary | ICD-10-CM | POA: Diagnosis not present

## 2021-05-29 DIAGNOSIS — Z431 Encounter for attention to gastrostomy: Secondary | ICD-10-CM | POA: Diagnosis not present

## 2021-05-29 DIAGNOSIS — C786 Secondary malignant neoplasm of retroperitoneum and peritoneum: Secondary | ICD-10-CM | POA: Diagnosis not present

## 2021-05-29 DIAGNOSIS — Z433 Encounter for attention to colostomy: Secondary | ICD-10-CM | POA: Diagnosis not present

## 2021-05-29 DIAGNOSIS — Z87891 Personal history of nicotine dependence: Secondary | ICD-10-CM | POA: Diagnosis not present

## 2021-05-29 DIAGNOSIS — E860 Dehydration: Secondary | ICD-10-CM | POA: Diagnosis not present

## 2021-05-29 DIAGNOSIS — D630 Anemia in neoplastic disease: Secondary | ICD-10-CM | POA: Diagnosis not present

## 2021-05-29 DIAGNOSIS — E876 Hypokalemia: Secondary | ICD-10-CM | POA: Diagnosis not present

## 2021-05-29 DIAGNOSIS — Z432 Encounter for attention to ileostomy: Secondary | ICD-10-CM | POA: Diagnosis not present

## 2021-05-29 DIAGNOSIS — E43 Unspecified severe protein-calorie malnutrition: Secondary | ICD-10-CM | POA: Diagnosis not present

## 2021-05-29 DIAGNOSIS — K591 Functional diarrhea: Secondary | ICD-10-CM | POA: Diagnosis not present

## 2021-06-01 DIAGNOSIS — K56600 Partial intestinal obstruction, unspecified as to cause: Secondary | ICD-10-CM | POA: Diagnosis not present

## 2021-06-02 NOTE — Progress Notes (Signed)
[facility]  [address] [place],  [place]  [address] [phone]  Clinic Day:  06/11/2021  Referring physician: Greig Right, MD  This document serves as a record of services personally performed by Sanaz Scarlett Macarthur Critchley, MD. It was created on their behalf by [facility], a trained medical scribe. The creation of this record is based on the scribe's personal observations and the provider's statements to them.  HISTORY OF PRESENT ILLNESS:  The patient is a 60 y.o. female with metastatic hormone receptor positive breast cancer, which includes spread of disease to her stomach.  Over these past months, the patient has been receiving carboplatin/gemcitabine.  She was scheduled for her 9th cycle earlier this month.  Unfortunately, due to severe abdominal pain from a small bowel obstruction, the patient had to be hospitalized.  She ended up undergoing a diverting loop ileostomy to offset the complications of her small bowel infection.  Per surgery's report, there is disease noted throughout her mesentery and external small bowel, which is likely the major factor leading to her recurrent obstructions.  Over these past few weeks, the patient has been trying to improve her nutrition to get her weight back up to more ideal levels.  Of note, she claims to have more abdominal pain, which she does not notice either due to her recent surgery or her known metastatic abdominal disease.  Despite all of her complications, the patient remains in good spirits.    VITALS:  Blood pressure 96/70, pulse (!) 120, temperature 98.2 F (36.8 C), resp. rate 16, height 5' 5.5" (1.664 m), weight 115 lb 12.8 oz (52.5 kg), SpO2 95 %.  Wt Readings from Last 3 Encounters:  06/11/21 115 lb 12.8 oz (52.5 kg)  05/08/21 114 lb 12 oz (52.1 kg)  05/07/21 112 lb 12.8 oz (51.2 kg)    Body mass index is 18.98 kg/m.  Performance status (ECOG): 1  PHYSICAL EXAM:  Physical Exam Constitutional:       General: She is not in acute distress.    Appearance: Normal appearance. She is normal weight.  HENT:     Head: Normocephalic and atraumatic.  Eyes:     General: No scleral icterus.    Extraocular Movements: Extraocular movements intact.     Conjunctiva/sclera: Conjunctivae normal.     Pupils: Pupils are equal, round, and reactive to light.  Cardiovascular:     Rate and Rhythm: Regular rhythm. Tachycardia present.     Pulses: Normal pulses.     Heart sounds: Normal heart sounds. No murmur heard.   No friction rub. No gallop.  Pulmonary:     Effort: Pulmonary effort is normal. No respiratory distress.     Breath sounds: Normal breath sounds.  Abdominal:     General: Bowel sounds are normal. There is no distension.     Palpations: Abdomen is soft. There is no hepatomegaly, splenomegaly or mass.     Tenderness: There is no abdominal tenderness.  Musculoskeletal:        General: Normal range of motion.     Cervical back: Normal range of motion and neck supple.     Right lower leg: No edema.     Left lower leg: No edema.  Lymphadenopathy:     Cervical: No cervical adenopathy.  Skin:    General: Skin is warm and dry.  Neurological:     General: No focal deficit present.     Mental Status: She is alert and oriented to person,  place, and time. Mental status is at baseline.  Psychiatric:        Mood and Affect: Mood normal.        Behavior: Behavior normal.        Thought Content: Thought content normal.        Judgment: Judgment normal.    LABS:   CBC Latest Ref Rng & Units 06/11/2021 05/06/2021 04/23/2021  WBC - 8.6 17.1 4.9  Hemoglobin 12.0 - 16.0 9.3(A) 9.1(A) 8.8(A)  Hematocrit 36 - 46 28(A) 28(A) 27(A)  Platelets 150 - 399 353 130(A) 101(A)   CMP Latest Ref Rng & Units 06/11/2021 05/06/2021 04/28/2021  BUN 4 - 21 19 17 20   Creatinine 0.5 - 1.1 1.0 0.9 1.0  Sodium 137 - 147 139 136(A) 139  Potassium 3.4 - 5.3 3.9 3.5 3.6  Chloride 99 - 108 104 101 107  CO2 13 - 22  29(A) 28(A) 23(A)  Calcium 8.7 - 10.7 8.9 8.9 8.9  Total Protein 6.3 - 8.2 g/dL - - -  Alkaline Phos 25 - 125 119 199(A) 156(A)  AST 13 - 35 33 32 56(A)  ALT 7 - 35 19 25 36(A)  . ASSESSMENT & PLAN:  Assessment/Plan:  A 60 y.o. female with metastatic hormone positive breast cancer.  Her current health precludes [place] from restarting chemotherapy at this time.  Overall, I am becoming very concerned that her disease is progressing to the point where giving her more palliative chemotherapy would be of a greater detriment than benefit.  She knows to use these next few weeks to physically get stronger by improving her nutrition.  She already tells me she is working with physical therapy to help with her strength.  As it pertains to her abdominal pain, I will prescribe her morphine extended release at 30 mg twice daily.  She knows to continue taking oxycodone 15 mg every 4-6 hours as needed for breakthrough pain.  As her hemoglobin is below 10, she will receive a dose of Retacrit tomorrow.  As she has had chronic diarrhea unrelated to her metastatic breast cancer, she will continue to receive Sandostatin on a monthly basis.  Of note, I will have her undergo Guardant360 testing today to see if there is any other form of targeted therapy that could potentially be used for her metastatic breast cancer.  In her current clinical state, I am not sure if palliative chemotherapy can be continued in the future.  All of these issues will be addressed at her next visit in 3 weeks.  The patient understands the plans discussed today and is in agreement with them.    I, Rita Ohara, am acting as scribe for Marice Potter, MD    I have reviewed this report as typed by the medical scribe, and it is complete and accurate.  Jacy Howat Macarthur Critchley, MD

## 2021-06-05 ENCOUNTER — Inpatient Hospital Stay: Payer: PPO

## 2021-06-08 ENCOUNTER — Ambulatory Visit: Payer: PPO

## 2021-06-08 DIAGNOSIS — C786 Secondary malignant neoplasm of retroperitoneum and peritoneum: Secondary | ICD-10-CM | POA: Diagnosis not present

## 2021-06-08 DIAGNOSIS — C50919 Malignant neoplasm of unspecified site of unspecified female breast: Secondary | ICD-10-CM | POA: Diagnosis not present

## 2021-06-08 DIAGNOSIS — Z483 Aftercare following surgery for neoplasm: Secondary | ICD-10-CM | POA: Diagnosis not present

## 2021-06-09 DIAGNOSIS — K56609 Unspecified intestinal obstruction, unspecified as to partial versus complete obstruction: Secondary | ICD-10-CM | POA: Insufficient documentation

## 2021-06-10 ENCOUNTER — Other Ambulatory Visit: Payer: Self-pay | Admitting: Oncology

## 2021-06-11 ENCOUNTER — Inpatient Hospital Stay: Payer: PPO | Attending: Oncology

## 2021-06-11 ENCOUNTER — Inpatient Hospital Stay (INDEPENDENT_AMBULATORY_CARE_PROVIDER_SITE_OTHER): Payer: PPO | Admitting: Oncology

## 2021-06-11 ENCOUNTER — Telehealth: Payer: Self-pay | Admitting: Oncology

## 2021-06-11 ENCOUNTER — Encounter: Payer: Self-pay | Admitting: Oncology

## 2021-06-11 ENCOUNTER — Other Ambulatory Visit: Payer: Self-pay | Admitting: Oncology

## 2021-06-11 VITALS — BP 96/70 | HR 120 | Temp 98.2°F | Resp 16 | Ht 65.5 in | Wt 115.8 lb

## 2021-06-11 DIAGNOSIS — Z170 Estrogen receptor positive status [ER+]: Secondary | ICD-10-CM

## 2021-06-11 DIAGNOSIS — D630 Anemia in neoplastic disease: Secondary | ICD-10-CM | POA: Insufficient documentation

## 2021-06-11 DIAGNOSIS — C50919 Malignant neoplasm of unspecified site of unspecified female breast: Secondary | ICD-10-CM | POA: Insufficient documentation

## 2021-06-11 DIAGNOSIS — C50111 Malignant neoplasm of central portion of right female breast: Secondary | ICD-10-CM | POA: Diagnosis not present

## 2021-06-11 DIAGNOSIS — C785 Secondary malignant neoplasm of large intestine and rectum: Secondary | ICD-10-CM

## 2021-06-11 DIAGNOSIS — C7889 Secondary malignant neoplasm of other digestive organs: Secondary | ICD-10-CM | POA: Insufficient documentation

## 2021-06-11 LAB — BASIC METABOLIC PANEL
BUN: 19 (ref 4–21)
CO2: 29 — AB (ref 13–22)
Chloride: 104 (ref 99–108)
Creatinine: 1 (ref 0.5–1.1)
Glucose: 92
Potassium: 3.9 (ref 3.4–5.3)
Sodium: 139 (ref 137–147)

## 2021-06-11 LAB — HEPATIC FUNCTION PANEL
ALT: 19 (ref 7–35)
AST: 33 (ref 13–35)
Alkaline Phosphatase: 119 (ref 25–125)
Bilirubin, Total: 0.6

## 2021-06-11 LAB — CBC AND DIFFERENTIAL
HCT: 28 — AB (ref 36–46)
Hemoglobin: 9.3 — AB (ref 12.0–16.0)
Neutrophils Absolute: 5.68
Platelets: 353 (ref 150–399)
WBC: 8.6

## 2021-06-11 LAB — CBC: RBC: 2.65 — AB (ref 3.87–5.11)

## 2021-06-11 LAB — COMPREHENSIVE METABOLIC PANEL
Albumin: 2.5 — AB (ref 3.5–5.0)
Calcium: 8.9 (ref 8.7–10.7)

## 2021-06-11 MED ORDER — MORPHINE SULFATE ER 30 MG PO TBCR: 30.0000 mg | EXTENDED_RELEASE_TABLET | Freq: Two times a day (BID) | ORAL | 0 refills | Status: AC

## 2021-06-11 MED ORDER — OXYCODONE HCL 15 MG PO TABS: ORAL_TABLET | ORAL | 0 refills | Status: DC

## 2021-06-11 NOTE — Telephone Encounter (Signed)
Per 12/22 LOS, patient scheduled for Jan 2023 Appt's.  Requesting patient be given Updated Appt Calendar

## 2021-06-11 NOTE — Telephone Encounter (Signed)
Per 12/22 LOS, patient scheduled for Jan 2023 Appt's - Next couple Injections.  Requesting patient be given Updated Appt Calendar at Next Visit

## 2021-06-12 ENCOUNTER — Other Ambulatory Visit: Payer: Self-pay

## 2021-06-12 ENCOUNTER — Inpatient Hospital Stay: Payer: PPO

## 2021-06-12 VITALS — BP 92/65 | HR 124 | Temp 98.1°F | Resp 18 | Ht 65.5 in | Wt 116.2 lb

## 2021-06-12 DIAGNOSIS — R197 Diarrhea, unspecified: Secondary | ICD-10-CM

## 2021-06-12 DIAGNOSIS — D630 Anemia in neoplastic disease: Secondary | ICD-10-CM | POA: Diagnosis not present

## 2021-06-12 DIAGNOSIS — C7889 Secondary malignant neoplasm of other digestive organs: Secondary | ICD-10-CM | POA: Diagnosis not present

## 2021-06-12 DIAGNOSIS — C50919 Malignant neoplasm of unspecified site of unspecified female breast: Secondary | ICD-10-CM | POA: Diagnosis not present

## 2021-06-12 MED ORDER — EPOETIN ALFA-EPBX 40000 UNIT/ML IJ SOLN
40000.0000 [IU] | Freq: Once | INTRAMUSCULAR | Status: AC
  Administered 2021-06-12: 40000 [IU] via SUBCUTANEOUS
  Filled 2021-06-12: qty 1

## 2021-06-12 MED ORDER — OCTREOTIDE ACETATE 30 MG IM KIT
30.0000 mg | PACK | Freq: Once | INTRAMUSCULAR | Status: AC
  Administered 2021-06-12: 30 mg via INTRAMUSCULAR
  Filled 2021-06-12: qty 1

## 2021-06-12 NOTE — Patient Instructions (Signed)
Octreotide acetate injection suspension What is this medication? OCTREOTIDE (ok TREE oh tide) is used to reduce blood levels of growth hormone in patients with a condition called acromegaly. This medicine also reduces flushing and watery diarrhea caused by certain types of cancer. This medicine may be used for other purposes; ask your health care provider or pharmacist if you have questions. COMMON BRAND NAME(S): Sandostatin LAR What should I tell my care team before I take this medication? They need to know if you have any of these conditions: diabetes gallbladder disease kidney disease liver disease thyroid disease an unusual or allergic reaction to octreotide, other medicines, foods, dyes, or preservatives pregnant or trying to get pregnant breast-feeding How should I use this medication? This medicine is for injection into a muscle. It is usually given by a health care professional in a hospital or clinic setting. Talk to your pediatrician regarding the use of this medicine in children. Special care may be needed. Overdosage: If you think you have taken too much of this medicine contact a poison control center or emergency room at once. NOTE: This medicine is only for you. Do not share this medicine with others. What if I miss a dose? Keep appointments for follow-up doses. It is important not to miss your dose. Call your doctor or health care professional if you are unable to keep an appointment. What may interact with this medication? Do not take this medicine with any of the following medications: cisapride dronedarone flibanserin lutetium Lu 177 dotatate pimozide saquinavir thioridazine This medicine may also interact with the following medications: bromocriptine certain medicines for blood pressure, heart disease, irregular heartbeat cyclosporine diuretics medicines for diabetes, including insulin quinidine This list may not describe all possible interactions. Give your  health care provider a list of all the medicines, herbs, non-prescription drugs, or dietary supplements you use. Also tell them if you smoke, drink alcohol, or use illegal drugs. Some items may interact with your medicine. What should I watch for while using this medication? Visit your health care professional for regular checks on your progress. Tell your health care professional if your symptoms do not start to get better or if they get worse. This medicine may cause decreases in blood sugar. Signs of low blood sugar include chills, cool, pale skin or cold sweats, drowsiness, extreme hunger, fast heartbeat, headache, nausea, nervousness or anxiety, shakiness, trembling, unsteadiness, tiredness, or weakness. Contact your doctor or health care professional right away if you experience any of these symptoms. This medicine may increase blood sugar. Ask your healthcare provider if changes in diet or medicines are needed if you have diabetes. This medicine may cause a decrease in vitamin B12. You should make sure that you get enough vitamin B12 while you are taking this medicine. Discuss the foods you eat and the vitamins you take with your health care professional. What side effects may I notice from receiving this medication? Side effects that you should report to your doctor or health care professional as soon as possible: allergic reactions like skin rash, itching or hives, swelling of the face, lips, or tongue fast, slow, or irregular heartbeat right upper belly pain severe stomach pain signs and symptoms of high blood sugar such as being more thirsty or hungry or having to urinate more than normal. You may also feel very tired or have blurry vision. signs and symptoms of low blood sugar such as feeling anxious; confusion; dizziness; increased hunger; unusually weak or tired; increased sweating; shakiness; cold, clammy skin;   irritable; headache; blurred vision; fast heartbeat; loss of  consciousness unusually weak or tired Side effects that usually do not require medical attention (report these to your doctor or health care professional if they continue or are bothersome): diarrhea dizziness gas headache nausea, vomiting pain, redness, or irritation at site where injected upset stomach This list may not describe all possible side effects. Call your doctor for medical advice about side effects. You may report side effects to FDA at 1-800-FDA-1088. Where should I keep my medication? This medicine is given in a hospital or clinic and will not be stored at home. NOTE: This sheet is a summary. It may not cover all possible information. If you have questions about this medicine, talk to your doctor, pharmacist, or health care provider.  2022 Elsevier/Gold Standard (2019-09-25 00:00:00)  

## 2021-06-16 ENCOUNTER — Encounter: Payer: Self-pay | Admitting: Oncology

## 2021-06-19 ENCOUNTER — Ambulatory Visit: Payer: PPO

## 2021-06-23 DIAGNOSIS — C50111 Malignant neoplasm of central portion of right female breast: Secondary | ICD-10-CM | POA: Diagnosis not present

## 2021-06-25 ENCOUNTER — Other Ambulatory Visit: Payer: Self-pay | Admitting: Pharmacist

## 2021-06-25 NOTE — Progress Notes (Signed)
Patient is receiving Assistance Medication - Supplied Externally. Medication: Sandostatin Manufacture: [employer] Approval Dates: Approved from 06/24/2021 until 06/20/2022. ID: [id number] Reason: Insurance would not approve First DOS: 07/10/2021

## 2021-06-28 DIAGNOSIS — M1990 Unspecified osteoarthritis, unspecified site: Secondary | ICD-10-CM | POA: Diagnosis not present

## 2021-06-28 DIAGNOSIS — E876 Hypokalemia: Secondary | ICD-10-CM | POA: Diagnosis not present

## 2021-06-28 DIAGNOSIS — D630 Anemia in neoplastic disease: Secondary | ICD-10-CM | POA: Diagnosis not present

## 2021-06-28 DIAGNOSIS — Z433 Encounter for attention to colostomy: Secondary | ICD-10-CM | POA: Diagnosis not present

## 2021-06-28 DIAGNOSIS — Z466 Encounter for fitting and adjustment of urinary device: Secondary | ICD-10-CM | POA: Diagnosis not present

## 2021-06-28 DIAGNOSIS — N179 Acute kidney failure, unspecified: Secondary | ICD-10-CM | POA: Diagnosis not present

## 2021-06-28 DIAGNOSIS — C50919 Malignant neoplasm of unspecified site of unspecified female breast: Secondary | ICD-10-CM | POA: Diagnosis not present

## 2021-06-28 DIAGNOSIS — Z431 Encounter for attention to gastrostomy: Secondary | ICD-10-CM | POA: Diagnosis not present

## 2021-06-28 DIAGNOSIS — Z87891 Personal history of nicotine dependence: Secondary | ICD-10-CM | POA: Diagnosis not present

## 2021-06-28 DIAGNOSIS — C786 Secondary malignant neoplasm of retroperitoneum and peritoneum: Secondary | ICD-10-CM | POA: Diagnosis not present

## 2021-06-28 DIAGNOSIS — E43 Unspecified severe protein-calorie malnutrition: Secondary | ICD-10-CM | POA: Diagnosis not present

## 2021-06-28 DIAGNOSIS — Z87440 Personal history of urinary (tract) infections: Secondary | ICD-10-CM | POA: Diagnosis not present

## 2021-06-28 DIAGNOSIS — E7800 Pure hypercholesterolemia, unspecified: Secondary | ICD-10-CM | POA: Diagnosis not present

## 2021-06-28 DIAGNOSIS — E860 Dehydration: Secondary | ICD-10-CM | POA: Diagnosis not present

## 2021-06-28 DIAGNOSIS — K591 Functional diarrhea: Secondary | ICD-10-CM | POA: Diagnosis not present

## 2021-06-28 DIAGNOSIS — Z432 Encounter for attention to ileostomy: Secondary | ICD-10-CM | POA: Diagnosis not present

## 2021-06-28 DIAGNOSIS — Z483 Aftercare following surgery for neoplasm: Secondary | ICD-10-CM | POA: Diagnosis not present

## 2021-06-29 NOTE — Progress Notes (Signed)
[facility]  [address] [place],  [place]  [address] [phone]  Clinic Day:  07/02/2021  Referring physician: Greig Right, MD  This document serves as a record of services personally performed by Hae Ahlers Macarthur Critchley, MD. It was created on their behalf by [facility] E, a trained medical scribe. The creation of this record is based on the scribe's personal observations and the provider's statements to them.  HISTORY OF PRESENT ILLNESS:  The patient is a 61 y.o. female with metastatic hormone receptor positive breast cancer, which includes spread of disease to her stomach.  Over these past months, the patient has been receiving carboplatin/gemcitabine.  She was scheduled for her 9th cycle last month.  Unfortunately, due to severe abdominal pain from a small bowel obstruction, the patient had to be hospitalized.  She ended up undergoing a diverting loop ileostomy to offset the complications of her small bowel obstruction.  Per surgery's report, there was disease studded throughout her mesentery and external small bowel, which is likely the major factor behind her recurrent obstructions.  Over these past few weeks, the patient has been trying to improve her nutrition to get her weight back up to more ideal levels.  The patient claims to eat significantly throughout the day.  However, she remains frustrated as she sees her weight continuing to fall.  VITALS:  Blood pressure (!) 84/60, pulse (!) 122, temperature 97.8 F (36.6 C), resp. rate 14, height 5' 5.5" (1.664 m), weight 97 lb 3.2 oz (44.1 kg), SpO2 97 %.  Wt Readings from Last 3 Encounters:  07/02/21 97 lb 3.2 oz (44.1 kg)  06/12/21 116 lb 4 oz (52.7 kg)  06/11/21 115 lb 12.8 oz (52.5 kg)    Body mass index is 15.93 kg/m.  Performance status (ECOG): 1  PHYSICAL EXAM:  Physical Exam Constitutional:      General: She is not in acute distress.    Appearance: Normal appearance.     Comments: He  appears cachectic; there has been significant weight loss since her last visit.  HENT:     Head: Normocephalic and atraumatic.  Eyes:     General: No scleral icterus.    Extraocular Movements: Extraocular movements intact.     Conjunctiva/sclera: Conjunctivae normal.     Pupils: Pupils are equal, round, and reactive to light.  Cardiovascular:     Rate and Rhythm: Regular rhythm. Tachycardia present.     Pulses: Normal pulses.     Heart sounds: Normal heart sounds. No murmur heard.   No friction rub. No gallop.  Pulmonary:     Effort: Pulmonary effort is normal. No respiratory distress.     Breath sounds: Normal breath sounds.  Abdominal:     General: Bowel sounds are normal. There is no distension.     Palpations: Abdomen is soft. There is no hepatomegaly, splenomegaly or mass.     Tenderness: There is no abdominal tenderness.  Musculoskeletal:        General: Normal range of motion.     Cervical back: Normal range of motion and neck supple.     Right lower leg: No edema.     Left lower leg: No edema.  Lymphadenopathy:     Cervical: No cervical adenopathy.  Skin:    General: Skin is warm and dry.  Neurological:     General: No focal deficit present.     Mental Status: She is alert and oriented to person, place, and time. Mental status  is at baseline.  Psychiatric:        Mood and Affect: Mood normal.        Behavior: Behavior normal.        Thought Content: Thought content normal.        Judgment: Judgment normal.    LABS:   CBC Latest Ref Rng & Units 07/02/2021 06/11/2021 05/06/2021  WBC - 6.3 8.6 17.1  Hemoglobin 12.0 - 16.0 11.4(A) 9.3(A) 9.1(A)  Hematocrit 36 - 46 34(A) 28(A) 28(A)  Platelets 150 - 399 389 353 130(A)   CMP Latest Ref Rng & Units 06/11/2021 05/06/2021 04/28/2021  BUN 4 - 21 19 17 20   Creatinine 0.5 - 1.1 1.0 0.9 1.0  Sodium 137 - 147 139 136(A) 139  Potassium 3.4 - 5.3 3.9 3.5 3.6  Chloride 99 - 108 104 101 107  CO2 13 - 22 29(A) 28(A) 23(A)   Calcium 8.7 - 10.7 8.9 8.9 8.9  Total Protein 6.3 - 8.2 g/dL - - -  Alkaline Phos 25 - 125 119 199(A) 156(A)  AST 13 - 35 33 32 56(A)  ALT 7 - 35 19 25 36(A)  . ASSESSMENT & PLAN:  Assessment/Plan:  A 61 y.o. female with metastatic hormone positive breast cancer.  In clinic today, I had a long and candid discussion with both the patient and her husband.  Her progressive weight loss is becoming a problem to where it may prevent her from getting future chemotherapy.  I believe her weight loss is due to the metastatic disease throughout her abdomen.  Furthermore, metastatic disease has been seen laced throughout her GI tract.  This disease is likely preventing her from adequately absorbing any form of nutrients from her diet, thus leading to progressive weight loss and cachexia over time.  If this continues to unfold, her substandard nutritional uptake will become incompatible with living.  I will give this patient an extra 4 weeks to get her health better.  I will see her back in 4 weeks for repeat clinical assessment.  If her clinical status does not improve, I will have no other choice but to recommend hospice at her next visit.  Although despondent, the patient and her husband understand all the plans discussed today and are in agreement with them.   I, Rita Ohara, am acting as scribe for Marice Potter, MD    I have reviewed this report as typed by the medical scribe, and it is complete and accurate.  Shantay Sonn Macarthur Critchley, MD

## 2021-06-30 ENCOUNTER — Encounter: Payer: Self-pay | Admitting: Oncology

## 2021-07-01 ENCOUNTER — Encounter: Payer: Self-pay | Admitting: Oncology

## 2021-07-02 ENCOUNTER — Inpatient Hospital Stay: Payer: PPO | Attending: Oncology

## 2021-07-02 ENCOUNTER — Other Ambulatory Visit: Payer: Self-pay

## 2021-07-02 ENCOUNTER — Inpatient Hospital Stay (INDEPENDENT_AMBULATORY_CARE_PROVIDER_SITE_OTHER): Payer: PPO | Admitting: Oncology

## 2021-07-02 ENCOUNTER — Telehealth: Payer: Self-pay | Admitting: Oncology

## 2021-07-02 ENCOUNTER — Other Ambulatory Visit: Payer: Self-pay | Admitting: Hematology and Oncology

## 2021-07-02 ENCOUNTER — Encounter: Payer: Self-pay | Admitting: Oncology

## 2021-07-02 VITALS — BP 84/58 | HR 127 | Temp 97.8°F | Resp 14 | Ht 65.5 in | Wt 97.2 lb

## 2021-07-02 DIAGNOSIS — Z170 Estrogen receptor positive status [ER+]: Secondary | ICD-10-CM | POA: Diagnosis not present

## 2021-07-02 DIAGNOSIS — C50919 Malignant neoplasm of unspecified site of unspecified female breast: Secondary | ICD-10-CM | POA: Insufficient documentation

## 2021-07-02 DIAGNOSIS — C785 Secondary malignant neoplasm of large intestine and rectum: Secondary | ICD-10-CM

## 2021-07-02 DIAGNOSIS — C7889 Secondary malignant neoplasm of other digestive organs: Secondary | ICD-10-CM | POA: Insufficient documentation

## 2021-07-02 DIAGNOSIS — R42 Dizziness and giddiness: Secondary | ICD-10-CM | POA: Insufficient documentation

## 2021-07-02 DIAGNOSIS — R0602 Shortness of breath: Secondary | ICD-10-CM | POA: Insufficient documentation

## 2021-07-02 DIAGNOSIS — C50111 Malignant neoplasm of central portion of right female breast: Secondary | ICD-10-CM

## 2021-07-02 DIAGNOSIS — R5383 Other fatigue: Secondary | ICD-10-CM | POA: Insufficient documentation

## 2021-07-02 LAB — HEPATIC FUNCTION PANEL
ALT: 57 — AB (ref 7–35)
AST: 79 — AB (ref 13–35)
Alkaline Phosphatase: 126 — AB (ref 25–125)
Bilirubin, Total: 0.5

## 2021-07-02 LAB — CBC AND DIFFERENTIAL
HCT: 34 — AB (ref 36–46)
Hemoglobin: 11.4 — AB (ref 12.0–16.0)
Neutrophils Absolute: 3.28
Platelets: 389 (ref 150–399)
WBC: 6.3

## 2021-07-02 LAB — BASIC METABOLIC PANEL
BUN: 39 — AB (ref 4–21)
CO2: 27 — AB (ref 13–22)
Chloride: 100 (ref 99–108)
Creatinine: 1.4 — AB (ref 0.5–1.1)
Glucose: 136
Potassium: 5.1 (ref 3.4–5.3)
Sodium: 135 — AB (ref 137–147)

## 2021-07-02 LAB — COMPREHENSIVE METABOLIC PANEL
Albumin: 3.7 (ref 3.5–5.0)
Calcium: 8.8 (ref 8.7–10.7)

## 2021-07-02 LAB — CBC: RBC: 3.29 — AB (ref 3.87–5.11)

## 2021-07-02 NOTE — Telephone Encounter (Signed)
Per 11/2 los next appt scheduled and given to patient 

## 2021-07-03 ENCOUNTER — Ambulatory Visit: Payer: PPO

## 2021-07-05 ENCOUNTER — Encounter: Payer: Self-pay | Admitting: Oncology

## 2021-07-06 ENCOUNTER — Ambulatory Visit: Payer: PPO

## 2021-07-10 ENCOUNTER — Inpatient Hospital Stay: Payer: PPO

## 2021-07-10 ENCOUNTER — Other Ambulatory Visit: Payer: Self-pay | Admitting: Hematology and Oncology

## 2021-07-10 VITALS — BP 94/53 | HR 118 | Temp 97.9°F | Resp 18

## 2021-07-10 DIAGNOSIS — R0602 Shortness of breath: Secondary | ICD-10-CM | POA: Diagnosis not present

## 2021-07-10 DIAGNOSIS — R531 Weakness: Secondary | ICD-10-CM | POA: Diagnosis not present

## 2021-07-10 DIAGNOSIS — D513 Other dietary vitamin B12 deficiency anemia: Secondary | ICD-10-CM | POA: Diagnosis not present

## 2021-07-10 DIAGNOSIS — R000 Tachycardia, unspecified: Secondary | ICD-10-CM | POA: Diagnosis not present

## 2021-07-10 DIAGNOSIS — C50919 Malignant neoplasm of unspecified site of unspecified female breast: Secondary | ICD-10-CM | POA: Diagnosis not present

## 2021-07-10 DIAGNOSIS — E860 Dehydration: Secondary | ICD-10-CM | POA: Diagnosis not present

## 2021-07-10 DIAGNOSIS — R42 Dizziness and giddiness: Secondary | ICD-10-CM | POA: Diagnosis not present

## 2021-07-10 DIAGNOSIS — Z170 Estrogen receptor positive status [ER+]: Secondary | ICD-10-CM | POA: Diagnosis not present

## 2021-07-10 DIAGNOSIS — J939 Pneumothorax, unspecified: Secondary | ICD-10-CM | POA: Diagnosis not present

## 2021-07-10 DIAGNOSIS — Z79899 Other long term (current) drug therapy: Secondary | ICD-10-CM | POA: Diagnosis not present

## 2021-07-10 DIAGNOSIS — Z87891 Personal history of nicotine dependence: Secondary | ICD-10-CM | POA: Diagnosis not present

## 2021-07-10 DIAGNOSIS — J181 Lobar pneumonia, unspecified organism: Secondary | ICD-10-CM | POA: Diagnosis not present

## 2021-07-10 DIAGNOSIS — I10 Essential (primary) hypertension: Secondary | ICD-10-CM | POA: Diagnosis not present

## 2021-07-10 DIAGNOSIS — E876 Hypokalemia: Secondary | ICD-10-CM | POA: Diagnosis not present

## 2021-07-10 DIAGNOSIS — E871 Hypo-osmolality and hyponatremia: Secondary | ICD-10-CM | POA: Diagnosis not present

## 2021-07-10 DIAGNOSIS — C7889 Secondary malignant neoplasm of other digestive organs: Secondary | ICD-10-CM | POA: Diagnosis not present

## 2021-07-10 DIAGNOSIS — Z85038 Personal history of other malignant neoplasm of large intestine: Secondary | ICD-10-CM | POA: Diagnosis not present

## 2021-07-10 DIAGNOSIS — Z933 Colostomy status: Secondary | ICD-10-CM | POA: Diagnosis not present

## 2021-07-10 DIAGNOSIS — N179 Acute kidney failure, unspecified: Secondary | ICD-10-CM | POA: Diagnosis not present

## 2021-07-10 DIAGNOSIS — Z853 Personal history of malignant neoplasm of breast: Secondary | ICD-10-CM | POA: Diagnosis not present

## 2021-07-10 DIAGNOSIS — R5383 Other fatigue: Secondary | ICD-10-CM | POA: Diagnosis not present

## 2021-07-10 DIAGNOSIS — J189 Pneumonia, unspecified organism: Secondary | ICD-10-CM | POA: Diagnosis not present

## 2021-07-10 DIAGNOSIS — Z792 Long term (current) use of antibiotics: Secondary | ICD-10-CM | POA: Diagnosis not present

## 2021-07-10 DIAGNOSIS — Z85028 Personal history of other malignant neoplasm of stomach: Secondary | ICD-10-CM | POA: Diagnosis not present

## 2021-07-10 DIAGNOSIS — R197 Diarrhea, unspecified: Secondary | ICD-10-CM

## 2021-07-10 MED ORDER — SODIUM CHLORIDE 0.9 % IV SOLN: INTRAVENOUS | Status: DC

## 2021-07-10 NOTE — Progress Notes (Signed)
0934:PT sent to ER per EMS. Unstable to go home. Patient agrees with transport,husband present wants home hospice.  Patient also agrees with hospice referal but wants intervention today if possible for quality of life.[place] FNP consult for Hospice referral made to see at ER.

## 2021-07-10 NOTE — Patient Instructions (Signed)

## 2021-07-24 NOTE — Progress Notes (Signed)
[facility]  [address] [place],  [place]  [address] [phone]  Clinic Day:  07/30/2021  Referring physician: Greig Right, MD  This document serves as a record of services personally performed by Dequincy Macarthur Critchley, MD. It was created on their behalf by [facility] E, a trained medical scribe. The creation of this record is based on the scribe's personal observations and the provider's statements to them.  HISTORY OF PRESENT ILLNESS:  The patient is a 61 y.o. female with metastatic hormone receptor positive breast cancer, which includes spread of disease to her stomach.  Over these past months, the patient was receiving carboplatin/gemcitabine.  However, she developed severe abdominal pain from a small bowel obstruction, which required a diverting loop ileostomy.  Per surgery's report, there was metastatic disease studded throughout her mesentery and external small bowel, consistent with disease progression/lack of treatment response.  Over these past weeks, the patient has been trying to improve her nutrition to get her weight back up to more ideal levels.  The patient claims to eat significantly throughout the day.  However, she still has been unable to gain weight.  She comes in today to reassess her baseline status and to determine if there is any form of treatment she can tolerate in her current health.    VITALS:  Blood pressure (!) 79/54, pulse (!) 112, temperature (!) 97.5 F (36.4 C), resp. rate 14, height 5' 5.5" (1.664 m), weight 94 lb 12.8 oz (43 kg), SpO2 100 %.  Wt Readings from Last 3 Encounters:  07/30/21 94 lb 12.8 oz (43 kg)  07/02/21 97 lb 3.2 oz (44.1 kg)  06/12/21 116 lb 4 oz (52.7 kg)    Body mass index is 15.54 kg/m.  Performance status (ECOG): 1  PHYSICAL EXAM:  Physical Exam Constitutional:      General: She is not in acute distress.    Appearance: Normal appearance.     Comments: She remains cachectic  HENT:      Head: Normocephalic and atraumatic.  Eyes:     General: No scleral icterus.    Extraocular Movements: Extraocular movements intact.     Conjunctiva/sclera: Conjunctivae normal.     Pupils: Pupils are equal, round, and reactive to light.  Cardiovascular:     Rate and Rhythm: Regular rhythm. Tachycardia present.     Pulses: Normal pulses.     Heart sounds: Normal heart sounds. No murmur heard.   No friction rub. No gallop.  Pulmonary:     Effort: Pulmonary effort is normal. No respiratory distress.     Breath sounds: Normal breath sounds.  Abdominal:     General: Bowel sounds are normal. There is no distension.     Palpations: Abdomen is soft. There is no hepatomegaly, splenomegaly or mass.     Tenderness: There is no abdominal tenderness.  Musculoskeletal:        General: Normal range of motion.     Cervical back: Normal range of motion and neck supple.     Right lower leg: No edema.     Left lower leg: No edema.  Lymphadenopathy:     Cervical: No cervical adenopathy.  Skin:    General: Skin is warm and dry.  Neurological:     General: No focal deficit present.     Mental Status: She is alert and oriented to person, place, and time. Mental status is at baseline.  Psychiatric:        Mood and Affect:  Mood normal.        Behavior: Behavior normal.        Thought Content: Thought content normal.        Judgment: Judgment normal.    LABS:   CBC Latest Ref Rng & Units 07/30/2021 07/02/2021 06/11/2021  WBC - 9.4 6.3 8.6  Hemoglobin 12.0 - 16.0 11.9(A) 11.4(A) 9.3(A)  Hematocrit 36 - 46 35(A) 34(A) 28(A)  Platelets 150 - 399 336 389 353   CMP Latest Ref Rng & Units 07/30/2021 07/02/2021 06/11/2021  BUN 4 - 21 55(A) 39(A) 19  Creatinine 0.5 - 1.1 1.5(A) 1.4(A) 1.0  Sodium 137 - 147 130(A) 135(A) 139  Potassium 3.4 - 5.3 3.7 5.1 3.9  Chloride 99 - 108 89(A) 100 104  CO2 13 - 22 33(A) 27(A) 29(A)  Calcium 8.7 - 10.7 8.9 8.8 8.9  Total Protein 6.3 - 8.2 g/dL - - -  Alkaline Phos  25 - 125 100 126(A) 119  AST 13 - 35 61(A) 79(A) 33  ALT 7 - 35 41(A) 57(A) 19  . ASSESSMENT & PLAN:  Assessment/Plan:  A 61 y.o. female with metastatic hormone positive breast cancer.  Once again, I am very concerned with the patient's baseline health.  At best, her performance status is an ECOG 3.  I do not believe she is in any shape to tolerate another type of palliative chemotherapy regimen.  However, the patient made it very clear that she is not interested in hospice and wants to try anything possible to battle back against her disease.  I will start her on elacestrant, which is a new hormone receptor degrading agent.  As it is endocrine therapy, it should not lead to any significant toxicity.  The main concern is whether it will be efficacious in fighting her disease.  She will start taking this medicine at 345 mg daily.  I will work with outpatient pharmacy to get this medicine sent to her as quickly as possible.  Although the patient declined hospice, she is clearly hospice appropriate.  If all upcoming therapy fails to work, I do not expect her life expectancy to extend beyond the next 4-6 months.  I will see this patient back in 1 month for repeat clinical assessment.  The patient and her husband understand all the plans discussed today and are in agreement with them.   I, Rita Ohara, am acting as scribe for Marice Potter, MD    I have reviewed this report as typed by the medical scribe, and it is complete and accurate.  Dequincy Macarthur Critchley, MD

## 2021-07-30 ENCOUNTER — Other Ambulatory Visit: Payer: Self-pay | Admitting: Oncology

## 2021-07-30 ENCOUNTER — Inpatient Hospital Stay: Payer: PPO | Attending: Oncology | Admitting: Oncology

## 2021-07-30 ENCOUNTER — Telehealth: Payer: Self-pay | Admitting: Oncology

## 2021-07-30 ENCOUNTER — Other Ambulatory Visit: Payer: Self-pay | Admitting: Hematology and Oncology

## 2021-07-30 ENCOUNTER — Inpatient Hospital Stay: Payer: PPO

## 2021-07-30 ENCOUNTER — Encounter: Payer: Self-pay | Admitting: Oncology

## 2021-07-30 ENCOUNTER — Other Ambulatory Visit: Payer: Self-pay

## 2021-07-30 VITALS — BP 79/54 | HR 112 | Temp 97.5°F | Resp 14 | Ht 65.5 in | Wt 94.8 lb

## 2021-07-30 DIAGNOSIS — Z170 Estrogen receptor positive status [ER+]: Secondary | ICD-10-CM

## 2021-07-30 DIAGNOSIS — C7889 Secondary malignant neoplasm of other digestive organs: Secondary | ICD-10-CM

## 2021-07-30 DIAGNOSIS — C50111 Malignant neoplasm of central portion of right female breast: Secondary | ICD-10-CM

## 2021-07-30 DIAGNOSIS — C785 Secondary malignant neoplasm of large intestine and rectum: Secondary | ICD-10-CM | POA: Diagnosis not present

## 2021-07-30 DIAGNOSIS — R110 Nausea: Secondary | ICD-10-CM

## 2021-07-30 DIAGNOSIS — C773 Secondary and unspecified malignant neoplasm of axilla and upper limb lymph nodes: Secondary | ICD-10-CM | POA: Diagnosis not present

## 2021-07-30 DIAGNOSIS — T451X5A Adverse effect of antineoplastic and immunosuppressive drugs, initial encounter: Secondary | ICD-10-CM

## 2021-07-30 DIAGNOSIS — D649 Anemia, unspecified: Secondary | ICD-10-CM | POA: Diagnosis not present

## 2021-07-30 DIAGNOSIS — R112 Nausea with vomiting, unspecified: Secondary | ICD-10-CM

## 2021-07-30 LAB — CBC AND DIFFERENTIAL
HCT: 35 — AB (ref 36–46)
Hemoglobin: 11.9 — AB (ref 12.0–16.0)
Neutrophils Absolute: 7.61
Platelets: 336 (ref 150–399)
WBC: 9.4

## 2021-07-30 LAB — BASIC METABOLIC PANEL
BUN: 55 — AB (ref 4–21)
CO2: 33 — AB (ref 13–22)
Chloride: 89 — AB (ref 99–108)
Creatinine: 1.5 — AB (ref 0.5–1.1)
Glucose: 149
Potassium: 3.7 (ref 3.4–5.3)
Sodium: 130 — AB (ref 137–147)

## 2021-07-30 LAB — COMPREHENSIVE METABOLIC PANEL
Albumin: 3.9 (ref 3.5–5.0)
Calcium: 8.9 (ref 8.7–10.7)

## 2021-07-30 LAB — HEPATIC FUNCTION PANEL
ALT: 41 — AB (ref 7–35)
AST: 61 — AB (ref 13–35)
Alkaline Phosphatase: 100 (ref 25–125)
Bilirubin, Total: 0.4

## 2021-07-30 LAB — CBC: RBC: 3.58 — AB (ref 3.87–5.11)

## 2021-07-30 NOTE — Telephone Encounter (Signed)
Per 07/30/21 los next appt scheduled and confirmed with patient °

## 2021-08-02 ENCOUNTER — Encounter: Payer: Self-pay | Admitting: Oncology

## 2021-08-02 ENCOUNTER — Other Ambulatory Visit: Payer: Self-pay | Admitting: Oncology

## 2021-08-03 ENCOUNTER — Encounter: Payer: Self-pay | Admitting: Oncology

## 2021-08-04 ENCOUNTER — Other Ambulatory Visit: Payer: Self-pay | Admitting: Oncology

## 2021-08-05 ENCOUNTER — Encounter: Payer: Self-pay | Admitting: Oncology

## 2021-08-05 ENCOUNTER — Other Ambulatory Visit: Payer: Self-pay | Admitting: Oncology

## 2021-08-06 ENCOUNTER — Encounter: Payer: Self-pay | Admitting: Oncology

## 2021-08-06 ENCOUNTER — Telehealth: Payer: Self-pay

## 2021-08-06 ENCOUNTER — Telehealth: Payer: Self-pay | Admitting: Pharmacy Technician

## 2021-08-06 ENCOUNTER — Other Ambulatory Visit (HOSPITAL_COMMUNITY): Payer: Self-pay

## 2021-08-06 DIAGNOSIS — C50111 Malignant neoplasm of central portion of right female breast: Secondary | ICD-10-CM

## 2021-08-06 DIAGNOSIS — Z170 Estrogen receptor positive status [ER+]: Secondary | ICD-10-CM

## 2021-08-06 NOTE — Telephone Encounter (Signed)
Oral Oncology Patient Advocate Encounter  Prior Authorization for Orserdu 345mg  has been approved.    KeyMordecai Maes - PA# [id number] Effective dates: 08/06/21 through 08/06/22  Unable to run test claim, due to drug not yet added into [facility] system. Referral being sent to [facility].  Oral Oncology Clinic will continue to follow.

## 2021-08-06 NOTE — Telephone Encounter (Signed)
Submitted a Prior Authorization request to  [employer]  for  Orserdu 345MG   via CoverMyMeds. Will update once we receive a response.   (Key: [id number]) - [id number]

## 2021-08-07 ENCOUNTER — Other Ambulatory Visit (HOSPITAL_COMMUNITY): Payer: Self-pay

## 2021-08-07 NOTE — Telephone Encounter (Signed)
Oral Oncology Pharmacist Encounter  Received new prescription for elacestrant (Orserdu) for the treatment of metastatic hormone receptor positive, HER2 negative breast cancer, planned duration until disease progression or unacceptable toxicity. Medication dose and frequency assessed.  Labs from 07/30/21 (hepatic, CBC, BMP) assessed, will need to monitor hepatic panel for moderate to severe impairment.   Current medication list in Epic reviewed, DDIs with Orserdu identified: none  Evaluated chart and no patient barriers to medication adherence noted.   Patient agreement for treatment documented in MD note on 07/30/2021.  Prescription has been e-scribed to the [facility] for benefits analysis and approval.  Oral Oncology Clinic will continue to follow for insurance authorization, copayment issues, initial counseling and start date.  Drema Halon, PharmD Hematology/Oncology Clinical Pharmacist [facility] [phone] 08/07/2021 9:04 AM

## 2021-08-13 ENCOUNTER — Other Ambulatory Visit: Payer: Self-pay | Admitting: Hematology and Oncology

## 2021-08-13 MED ORDER — ONDANSETRON 8 MG PO TBDP: ORAL_TABLET | ORAL | 0 refills | Status: DC

## 2021-08-13 NOTE — Telephone Encounter (Addendum)
Oral Chemotherapy Pharmacist Encounter ° °I spoke with patient for overview of: Orserdu (elacestrant) for the treatment of metastatic, hormone-receptor positive, HER2 negative breast cancer, planned duration until disease progression or unacceptable toxicity.  ° °Counseled patient on administration, dosing, side effects, monitoring, drug-food interactions, safe handling, storage, and disposal. ° °Patient will take 1 tablet (345mg) by mouth once daily with food. ° °Orserdu start date: 08/14/2021 °*Patient has not received yet but is supposed to sometime today ° °Adverse effects include but are not limited to: nausea/ vomiting, fatigue, musculoskeletal pain, increased triglycerides, increased cholesterol and increased LFTs.  ° °Patient has picked up anti-emetics and will take medication while on Orserdu. ° °Reviewed with patient importance of keeping a medication schedule and plan for any missed doses. No barriers to medication adherence identified. ° °Medication reconciliation performed and medication/allergy list updated. ° °Insurance authorization for Orserdu has been obtained. °Medication will be filled at [facility].  ° °Patient informed the pharmacy will reach out 5-7 days prior to needing next fill of Orserdu  to coordinate continued medication acquisition to prevent break in therapy. ° °All questions answered. ° °Barbara Mayo voiced understanding and appreciation.  ° °Medication education handout placed in mail for patient. Patient knows to call the office with questions or concerns. Oral Chemotherapy Clinic phone number provided to patient.  ° ° , PharmD °Hematology/Oncology Clinical Pharmacist ° Oral Chemotherapy Navigation Clinic °[phone] °08/13/2021 9:52 AM ° ° °

## 2021-08-20 DIAGNOSIS — Z932 Ileostomy status: Secondary | ICD-10-CM | POA: Diagnosis not present

## 2021-08-21 ENCOUNTER — Telehealth: Payer: Self-pay

## 2021-08-21 NOTE — Telephone Encounter (Signed)
I spoke with pt. She is doing okay. She is dividing her tablet into, because when she took the entire tablet @ once, the tablet came out her ostomy bag unabsorbed. She now takes 1/2 tab @ 6pm and the other 1/2 tab @ 9 pm. No further sightings of tablet in her ostomy bag. She is taking the medication with food. She has missed only 1 dose, which was last night. She just forgot to take it. She denies N/V, headache, muscle/joint pain, fevers, rashes & increased diarrhea (she has diarrhea daily as baseline).  Pt reminded to call us if she develops temp of 100.4 or higher, day or night. She verbalized understanding. I confirmed next appt , 08/27/2021 @ 0830. ?

## 2021-08-21 NOTE — Progress Notes (Signed)
?[facility]  ?[address] ?[place],  [place]  [address] ?[phone] ? ?Clinic Day:  08/27/2021 ? ?Referring physician: Greig Right, MD ? ?This document serves as a record of services personally performed by Marice Potter, MD. It was created on their behalf by Curry,Lauren E, a trained medical scribe. The creation of this record is based on the scribe's personal observations and the provider's statements to them. ? ?HISTORY OF PRESENT ILLNESS:  ?The patient is a 61 y.o. female with metastatic hormone receptor positive breast cancer, which includes spread of disease to her stomach.  She comes in today for routine follow-up.  Over these past 2+ weeks, she has been taking elacestrant for her disease management.   She has tolerated her oral therapy fairly well.  She still has occasional GI problems, but claims they have not gotten worse.  She denies having any new symptoms/findings which concern her for disease progression while on her elacestrant.   ? ?VITALS:  ?Blood pressure (!) 86/65, pulse (!) 122, temperature (!) 97.5 ?F (36.4 ?C), resp. rate 14, height 5' 5.5" (1.664 m), weight 93 lb (42.2 kg), SpO2 100 %.  ?Wt Readings from Last 3 Encounters:  ?08/27/21 93 lb (42.2 kg)  ?07/30/21 94 lb 12.8 oz (43 kg)  ?07/02/21 97 lb 3.2 oz (44.1 kg)  ?  ?Body mass index is 15.24 kg/m?. ? ?Performance status (ECOG): 1 ? ?PHYSICAL EXAM:  ?Physical Exam ?Constitutional:   ?   General: She is not in acute distress. ?   Appearance: Normal appearance.  ?   Comments: She remains very thin/gaunt with chronically ill appearance  ?HENT:  ?   Head: Normocephalic and atraumatic.  ?Eyes:  ?   General: No scleral icterus. ?   Extraocular Movements: Extraocular movements intact.  ?   Conjunctiva/sclera: Conjunctivae normal.  ?   Pupils: Pupils are equal, round, and reactive to light.  ?Cardiovascular:  ?   Rate and Rhythm: Regular rhythm. Tachycardia present.  ?   Pulses: Normal pulses.  ?   Heart  sounds: Normal heart sounds. No murmur heard. ?  No friction rub. No gallop.  ?Pulmonary:  ?   Effort: Pulmonary effort is normal. No respiratory distress.  ?   Breath sounds: Normal breath sounds.  ?Abdominal:  ?   General: Bowel sounds are normal. There is no distension.  ?   Palpations: Abdomen is soft. There is no hepatomegaly, splenomegaly or mass.  ?   Tenderness: There is no abdominal tenderness.  ?Musculoskeletal:     ?   General: Normal range of motion.  ?   Cervical back: Normal range of motion and neck supple.  ?   Right lower leg: No edema.  ?   Left lower leg: No edema.  ?Lymphadenopathy:  ?   Cervical: No cervical adenopathy.  ?Skin: ?   General: Skin is warm and dry.  ?Neurological:  ?   General: No focal deficit present.  ?   Mental Status: She is alert and oriented to person, place, and time. Mental status is at baseline.  ?Psychiatric:     ?   Mood and Affect: Mood normal.     ?   Behavior: Behavior normal.     ?   Thought Content: Thought content normal.     ?   Judgment: Judgment normal.  ? ? ?LABS:  ? ?CBC Latest Ref Rng & Units 08/27/2021 07/30/2021 07/02/2021  ?WBC - 7.4 9.4 6.3  ?Hemoglobin  12.0 - 16.0 11.5(A) 11.9(A) 11.4(A)  ?Hematocrit 36 - 46 35(A) 35(A) 34(A)  ?Platelets 150 - 400 K/uL 261 336 389  ? ?CMP Latest Ref Rng & Units 08/27/2021 07/30/2021 07/02/2021  ?BUN 4 - 21 37(A) 55(A) 39(A)  ?Creatinine 0.5 - 1.1 1.0 1.5(A) 1.4(A)  ?Sodium 137 - 147 131(A) 130(A) 135(A)  ?Potassium 3.5 - 5.1 mEq/L 3.6 3.7 5.1  ?Chloride 99 - 108 100 89(A) 100  ?CO2 13 - 22 22 33(A) 27(A)  ?Calcium 8.7 - 10.7 9.5 8.9 8.8  ?Total Protein 6.3 - 8.2 g/dL - - -  ?Alkaline Phos 25 - 125 76 100 126(A)  ?AST 13 - 35 35 61(A) 79(A)  ?ALT 7 - 35 U/L 22 41(A) 57(A)  ?Marland Kitchen ?ASSESSMENT & PLAN:  ?Assessment/Plan:  A 61 y.o. female with metastatic hormone positive breast cancer.   For now, she will continue taking elacestrant on a daily basis.  I am pleased as her liver enzymes have normalized, which suggests she may be having a  positive response to therapy.  Although she is chronically ill, she appears to be doing okay.  I will see her back in late April 2023 for repeat clinical assessment.  CT scans of her chest/abdomen/pelvis will be done a day before her next visit to ascertain her new disease baseline after being on elacestrant for 2 months.    The patient and her husband understand all the plans discussed today and are in agreement with them.  ? ?I, Rita Ohara, am acting as scribe for Marice Potter, MD   ? ?I have reviewed this report as typed by the medical scribe, and it is complete and accurate. ? ?Ned Kakar Macarthur Critchley, MD ? ? ?  ? ?

## 2021-08-27 ENCOUNTER — Inpatient Hospital Stay: Payer: PPO

## 2021-08-27 ENCOUNTER — Inpatient Hospital Stay: Payer: PPO | Attending: Oncology | Admitting: Oncology

## 2021-08-27 ENCOUNTER — Encounter: Payer: Self-pay | Admitting: Oncology

## 2021-08-27 ENCOUNTER — Other Ambulatory Visit: Payer: Self-pay | Admitting: Oncology

## 2021-08-27 ENCOUNTER — Other Ambulatory Visit: Payer: Self-pay

## 2021-08-27 VITALS — BP 86/65 | HR 122 | Temp 97.5°F | Resp 14 | Ht 65.5 in | Wt 93.0 lb

## 2021-08-27 DIAGNOSIS — C50111 Malignant neoplasm of central portion of right female breast: Secondary | ICD-10-CM

## 2021-08-27 DIAGNOSIS — Z170 Estrogen receptor positive status [ER+]: Secondary | ICD-10-CM

## 2021-08-27 LAB — HEPATIC FUNCTION PANEL
ALT: 22 U/L (ref 7–35)
AST: 35 (ref 13–35)
Alkaline Phosphatase: 76 (ref 25–125)
Bilirubin, Total: 0.5

## 2021-08-27 LAB — CBC AND DIFFERENTIAL
HCT: 35 — AB (ref 36–46)
Hemoglobin: 11.5 — AB (ref 12.0–16.0)
Neutrophils Absolute: 3.7
Platelets: 261 10*3/uL (ref 150–400)
WBC: 7.4

## 2021-08-27 LAB — CBC: RBC: 3.69 — AB (ref 3.87–5.11)

## 2021-08-27 LAB — BASIC METABOLIC PANEL
BUN: 37 — AB (ref 4–21)
CO2: 22 (ref 13–22)
Chloride: 100 (ref 99–108)
Creatinine: 1 (ref 0.5–1.1)
Glucose: 132
Potassium: 3.6 mEq/L (ref 3.5–5.1)
Sodium: 131 — AB (ref 137–147)

## 2021-08-27 LAB — COMPREHENSIVE METABOLIC PANEL
Albumin: 3.7 (ref 3.5–5.0)
Calcium: 9.5 (ref 8.7–10.7)

## 2021-09-04 ENCOUNTER — Telehealth: Payer: Self-pay

## 2021-09-04 NOTE — Telephone Encounter (Addendum)
I spoke with pt to see how she is tolerating the Orserdu. She is taking the medication @ 6pm daily w/food. Denies missed doses. Denies N/V, cough, SOB, joint pain, loss of appetite, fevers, & headache. Pt reminded to avoid grapefruit and/or grapefruit juice. I also reminded pt of the importance of calling us if she develops temp of 100.4 or higher, day or night. She verbalized understanding and I confirmed her next appt. ?

## 2021-09-17 ENCOUNTER — Telehealth: Payer: Self-pay

## 2021-09-17 NOTE — Telephone Encounter (Signed)
I spoke with pt to see how she is tolerating the Orserdu. Pt is feeling okay. She is taking the Orserdu daily @ 6pm with food. Denies missed doses. She denies N/V, chills, cough, SOB, mouth sores, headache, and musculoskeletal pain. She has baseline diarrhea related to her dx. It has not gotten worse with addition of Orserdu. Pt reminded to call us with temp of 100.4 or higher, day or night. She verbalized understanding. ?

## 2021-09-21 DIAGNOSIS — Z932 Ileostomy status: Secondary | ICD-10-CM | POA: Diagnosis not present

## 2021-10-15 ENCOUNTER — Encounter: Payer: Self-pay | Admitting: Oncology

## 2021-10-15 ENCOUNTER — Other Ambulatory Visit: Payer: Self-pay | Admitting: Oncology

## 2021-10-15 DIAGNOSIS — Z853 Personal history of malignant neoplasm of breast: Secondary | ICD-10-CM | POA: Diagnosis not present

## 2021-10-15 DIAGNOSIS — C189 Malignant neoplasm of colon, unspecified: Secondary | ICD-10-CM | POA: Diagnosis not present

## 2021-10-15 DIAGNOSIS — R937 Abnormal findings on diagnostic imaging of other parts of musculoskeletal system: Secondary | ICD-10-CM | POA: Diagnosis not present

## 2021-10-15 DIAGNOSIS — R188 Other ascites: Secondary | ICD-10-CM | POA: Diagnosis not present

## 2021-10-15 DIAGNOSIS — C50111 Malignant neoplasm of central portion of right female breast: Secondary | ICD-10-CM | POA: Diagnosis not present

## 2021-10-15 DIAGNOSIS — Z9011 Acquired absence of right breast and nipple: Secondary | ICD-10-CM | POA: Diagnosis not present

## 2021-10-15 DIAGNOSIS — R918 Other nonspecific abnormal finding of lung field: Secondary | ICD-10-CM | POA: Diagnosis not present

## 2021-10-18 NOTE — Progress Notes (Signed)
?[facility]  ?[address] ?[place],  [place]  [address] ?[phone] ? ?Clinic Day:  10/19/2021 ? ?Referring physician: Greig Right, MD ? ?This document serves as a record of services personally performed by Marice Potter, MD. It was created on their behalf by Curry,Lauren E, a trained medical scribe. The creation of this record is based on the scribe's personal observations and the provider's statements to them. ? ?HISTORY OF PRESENT ILLNESS:  ?The patient is a 61 y.o. female with metastatic hormone receptor positive breast cancer, which includes spread of disease to her stomach.  She comes in today to go over her CT scans to ascertain her new disease baseline after being on elacestrant for the past 2 months.  Since her last visit, the patient has been doing okay.  She has tolerated her oral therapy fairly well.  She still has intermittent abdominal pain, but claims it has not gotten worse.  She denies having any new symptoms/findings which concern her for overt disease progression while on her elacestrant.   ? ?VITALS:  ?Blood pressure (!) 71/41, pulse (!) 114, temperature (!) 97.5 ?F (36.4 ?C), resp. rate 14, height 5' 5.5" (1.664 m), weight 97 lb 6.4 oz (44.2 kg), SpO2 98 %.  ?Wt Readings from Last 3 Encounters:  ?10/19/21 97 lb 6.4 oz (44.2 kg)  ?08/27/21 93 lb (42.2 kg)  ?07/30/21 94 lb 12.8 oz (43 kg)  ?  ?Body mass index is 15.96 kg/m?. ? ?Performance status (ECOG): 1 ? ?PHYSICAL EXAM:  ?Physical Exam ?Constitutional:   ?   General: She is not in acute distress. ?   Appearance: Normal appearance.  ?   Comments: She remains very thin/gaunt with chronically ill appearance  ?HENT:  ?   Head: Normocephalic and atraumatic.  ?Eyes:  ?   General: No scleral icterus. ?   Extraocular Movements: Extraocular movements intact.  ?   Conjunctiva/sclera: Conjunctivae normal.  ?   Pupils: Pupils are equal, round, and reactive to light.  ?Cardiovascular:  ?   Rate and Rhythm: Regular  rhythm. Tachycardia present.  ?   Pulses: Normal pulses.  ?   Heart sounds: Normal heart sounds. No murmur heard. ?  No friction rub. No gallop.  ?Pulmonary:  ?   Effort: Pulmonary effort is normal. No respiratory distress.  ?   Breath sounds: Normal breath sounds.  ?Abdominal:  ?   General: Bowel sounds are normal. There is no distension.  ?   Palpations: Abdomen is soft. There is no hepatomegaly, splenomegaly or mass.  ?   Tenderness: There is no abdominal tenderness.  ?Musculoskeletal:     ?   General: Normal range of motion.  ?   Cervical back: Normal range of motion and neck supple.  ?   Right lower leg: No edema.  ?   Left lower leg: No edema.  ?Lymphadenopathy:  ?   Cervical: No cervical adenopathy.  ?Skin: ?   General: Skin is warm and dry.  ?Neurological:  ?   General: No focal deficit present.  ?   Mental Status: She is alert and oriented to person, place, and time. Mental status is at baseline.  ?Psychiatric:     ?   Mood and Affect: Mood normal.     ?   Behavior: Behavior normal.     ?   Thought Content: Thought content normal.     ?   Judgment: Judgment normal.  ? ?SCANS:  CT scans of  her chest/abdomen/pelvis revealed the following: ?FINDINGS: ?CT CHEST FINDINGS ? ?Cardiovascular: Port in the anterior chest wall with tip in distal ?SVC. No significant vascular findings. Normal heart size. No ?pericardial effusion. ? ?Mediastinum/Nodes: No mediastinal adenopathy. No supraclavicular ?adenopathy. Mild thickening through the distal esophagus similar ?prior. ? ?Lungs/Pleura: Peripheral consolidation in the RIGHT upper lobes ?unchanged from prior. Resolution of nodularity described on ?comparison exam from 05/06/2021. No new or suspicious pulmonary ?nodules. ? ?Musculoskeletal: No aggressive osseous lesion. Post mastectomy ?anatomy on the RIGHT. ? ?CT ABDOMEN AND PELVIS FINDINGS ? ?Hepatobiliary: No enhancing hepatic lesion. There is new large ?volume ascites in the abdomen and pelvis. The portal vein is  patent. ?Spleen is normal volume. Postcholecystectomy. ? ?Pancreas: Pancreas is normal. No ductal dilatation. No pancreatic ?inflammation. ? ?Spleen: Normal spleen ? ?Adrenals/urinary tract: Adrenal glands and kidneys are normal. The ?ureters and bladder normal. ? ?Stomach/Bowel: Stomach duodenum and small-bowel rae normal. Post ?near total colectomy. RIGHT abdominal wall ileostomy. No bowel ?obstruction. The excluded LEFT colon is collapsed. No mesenteric ?adenopathy. ? ?Vascular/Lymphatic: Abdominal aorta is normal caliber with ?atherosclerotic calcification. There is no retroperitoneal or ?periportal lymphadenopathy. No pelvic lymphadenopathy. ? ?Reproductive: Post hysterectomy. Adnexa unremarkable ? ?Other: Large volume ascites. Mild nodularity along the ventral ?peritoneal surface the level the pelvic brim (image 96/2. ? ?Musculoskeletal: New sclerotic mottled appearance of the entirety of ?the vertebral column as well sternum. ? ?IMPRESSION: ?Chest Impression: ? ?1. Resolution of pulmonary nodularity seen on comparison CT scan. ?2. Thickening in the esophagus may relate to prior radiation ?treatment. ?3. No lymphadenopathy. No suspicious pulmonary nodularity. ? ?Abdomen / Pelvis Impression: ? ?1. New large volume ascites. No clear etiology. Nodularity along the ?ventral peritoneal space could represent carcinomatosis. ?2. New sclerotic mottled appearance of the bones of the spine, ?pelvis and sternum. Concern for widespread skeletal metastasis. ?3. Consider FDG PET scan for further evaluation ? ? ?LABS:  ? ? ? ?ASSESSMENT & PLAN:  ?Assessment/Plan:  A 61 y.o. female with metastatic hormone positive breast cancer.   In clinic today, I went over her CT scan images with her, for which she could see that the new finding appreciated is ascites.  As she has had disease spread to her stomach and colon, my concern is this may be malignant ascites.  To confirm this, I will have IR perform a paracentesis with cytology  in the forthcoming days.  For now, she will continue taking elacestrant on a daily basis.  I will see her back in 3 weeks to go over her paracentesis results and their implications.  The patient and her husband understand all the plans discussed today and are in agreement with them. ? ?Nakaila Freeze Macarthur Critchley, MD ? ? ?  ? ?

## 2021-10-19 ENCOUNTER — Other Ambulatory Visit: Payer: Self-pay

## 2021-10-19 ENCOUNTER — Inpatient Hospital Stay: Payer: PPO | Attending: Oncology | Admitting: Oncology

## 2021-10-19 ENCOUNTER — Other Ambulatory Visit: Payer: Self-pay | Admitting: Oncology

## 2021-10-19 VITALS — BP 71/41 | HR 114 | Temp 97.5°F | Resp 14 | Ht 65.5 in | Wt 97.4 lb

## 2021-10-19 DIAGNOSIS — Z170 Estrogen receptor positive status [ER+]: Secondary | ICD-10-CM

## 2021-10-19 DIAGNOSIS — C50111 Malignant neoplasm of central portion of right female breast: Secondary | ICD-10-CM

## 2021-10-19 MED ORDER — OXYCODONE HCL 15 MG PO TABS: ORAL_TABLET | ORAL | 0 refills | Status: AC

## 2021-10-20 DIAGNOSIS — Z932 Ileostomy status: Secondary | ICD-10-CM | POA: Diagnosis not present

## 2021-10-21 ENCOUNTER — Encounter: Payer: Self-pay | Admitting: Oncology

## 2021-10-21 DIAGNOSIS — R188 Other ascites: Secondary | ICD-10-CM | POA: Diagnosis not present

## 2021-10-21 DIAGNOSIS — C786 Secondary malignant neoplasm of retroperitoneum and peritoneum: Secondary | ICD-10-CM | POA: Diagnosis not present

## 2021-10-21 DIAGNOSIS — J939 Pneumothorax, unspecified: Secondary | ICD-10-CM | POA: Diagnosis not present

## 2021-10-21 DIAGNOSIS — C50111 Malignant neoplasm of central portion of right female breast: Secondary | ICD-10-CM | POA: Diagnosis not present

## 2021-10-21 DIAGNOSIS — E871 Hypo-osmolality and hyponatremia: Secondary | ICD-10-CM | POA: Diagnosis not present

## 2021-10-21 DIAGNOSIS — D513 Other dietary vitamin B12 deficiency anemia: Secondary | ICD-10-CM | POA: Diagnosis not present

## 2021-10-21 DIAGNOSIS — E860 Dehydration: Secondary | ICD-10-CM | POA: Diagnosis not present

## 2021-10-21 DIAGNOSIS — N179 Acute kidney failure, unspecified: Secondary | ICD-10-CM | POA: Diagnosis not present

## 2021-10-21 DIAGNOSIS — Z85028 Personal history of other malignant neoplasm of stomach: Secondary | ICD-10-CM | POA: Diagnosis not present

## 2021-10-21 DIAGNOSIS — Z853 Personal history of malignant neoplasm of breast: Secondary | ICD-10-CM | POA: Diagnosis not present

## 2021-10-21 DIAGNOSIS — J189 Pneumonia, unspecified organism: Secondary | ICD-10-CM | POA: Diagnosis not present

## 2021-10-21 DIAGNOSIS — R180 Malignant ascites: Secondary | ICD-10-CM | POA: Diagnosis not present

## 2021-10-21 DIAGNOSIS — Z85038 Personal history of other malignant neoplasm of large intestine: Secondary | ICD-10-CM | POA: Diagnosis not present

## 2021-10-22 ENCOUNTER — Encounter: Payer: Self-pay | Admitting: Oncology

## 2021-10-23 ENCOUNTER — Telehealth: Payer: Self-pay

## 2021-10-23 NOTE — Telephone Encounter (Signed)
I spoke with pt. She states she feels better since paracentesis on 10/21/2021. She admits to some nausea afterwards, as well as 2 episodes of emesis (small amt). No further N/V since. Her abdomen feels softer. She is only able to eat small amt of food @ a time, which I told her was the best way to eat. Her appetite isn't well. Pt mentioned "I hope the fluid they removed doesn't take my weight down too much".  ?She is still taking the Orserdu every night @ 6pm with food. She denies missed doses. She denies mouth sores, cough, SOB, fevers, headache,and joint pain. Her diarrhea remains baseline. She states, "diarrhea isn't bad". I reminded pt of the importance of calling us if she develops temp of 100.4 or higher day or night. Pt verbalized understanding. ?

## 2021-10-24 DIAGNOSIS — B961 Klebsiella pneumoniae [K. pneumoniae] as the cause of diseases classified elsewhere: Secondary | ICD-10-CM | POA: Diagnosis not present

## 2021-10-24 DIAGNOSIS — N179 Acute kidney failure, unspecified: Secondary | ICD-10-CM | POA: Diagnosis not present

## 2021-10-24 DIAGNOSIS — Z79899 Other long term (current) drug therapy: Secondary | ICD-10-CM | POA: Diagnosis not present

## 2021-10-24 DIAGNOSIS — M1990 Unspecified osteoarthritis, unspecified site: Secondary | ICD-10-CM | POA: Diagnosis not present

## 2021-10-24 DIAGNOSIS — N3000 Acute cystitis without hematuria: Secondary | ICD-10-CM | POA: Diagnosis not present

## 2021-10-24 DIAGNOSIS — N390 Urinary tract infection, site not specified: Secondary | ICD-10-CM | POA: Diagnosis not present

## 2021-10-24 DIAGNOSIS — C50919 Malignant neoplasm of unspecified site of unspecified female breast: Secondary | ICD-10-CM | POA: Diagnosis not present

## 2021-10-24 DIAGNOSIS — Z87440 Personal history of urinary (tract) infections: Secondary | ICD-10-CM | POA: Diagnosis not present

## 2021-10-24 DIAGNOSIS — Z87891 Personal history of nicotine dependence: Secondary | ICD-10-CM | POA: Diagnosis not present

## 2021-10-24 DIAGNOSIS — C785 Secondary malignant neoplasm of large intestine and rectum: Secondary | ICD-10-CM | POA: Diagnosis not present

## 2021-10-24 DIAGNOSIS — R531 Weakness: Secondary | ICD-10-CM | POA: Diagnosis not present

## 2021-10-24 DIAGNOSIS — E871 Hypo-osmolality and hyponatremia: Secondary | ICD-10-CM | POA: Diagnosis not present

## 2021-10-24 DIAGNOSIS — E7800 Pure hypercholesterolemia, unspecified: Secondary | ICD-10-CM | POA: Diagnosis not present

## 2021-10-24 DIAGNOSIS — R627 Adult failure to thrive: Secondary | ICD-10-CM | POA: Diagnosis not present

## 2021-10-24 DIAGNOSIS — I951 Orthostatic hypotension: Secondary | ICD-10-CM | POA: Diagnosis not present

## 2021-10-24 DIAGNOSIS — E860 Dehydration: Secondary | ICD-10-CM | POA: Diagnosis not present

## 2021-10-24 DIAGNOSIS — Z792 Long term (current) use of antibiotics: Secondary | ICD-10-CM | POA: Diagnosis not present

## 2021-10-26 ENCOUNTER — Other Ambulatory Visit: Payer: Self-pay

## 2021-10-26 DIAGNOSIS — T451X5A Adverse effect of antineoplastic and immunosuppressive drugs, initial encounter: Secondary | ICD-10-CM

## 2021-10-26 MED ORDER — ONDANSETRON 8 MG PO TBDP: ORAL_TABLET | ORAL | 0 refills | Status: AC

## 2021-11-09 NOTE — Progress Notes (Signed)
[facility]  [address] [place],  [place]  [address] [phone]  Clinic Day:  11/10/2021  Referring physician: Greig Right, MD   HISTORY OF PRESENT ILLNESS:  The patient is a 61 y.o. female with metastatic hormone receptor positive breast cancer, which includes spread of disease to her stomach.  Recent CT scans that show evidence of ascites that was worrisome for a malignant effusion being present.  She comes in today to go over her recent paracentesis to determine if he has malignant ascites.  Of note, she has been taking elacestrant for the past 2+ months.  Since her last visit, the patient has been doing okay.  However, he continues to get wound care and is progressively losing weight.  Despite all of this, she remains in fairly good spirits.  She is beginning to come to the realization that her disease is severely impacting her daily quality of life  VITALS:  Blood pressure (!) 85/61, pulse (!) 122, temperature 97.8 F (36.6 C), resp. rate 14, height 5' 5.5" (1.664 m), weight 88 lb 11.2 oz (40.2 kg), SpO2 97 %.  Wt Readings from Last 3 Encounters:  11/10/21 88 lb 11.2 oz (40.2 kg)  10/19/21 97 lb 6.4 oz (44.2 kg)  08/27/21 93 lb (42.2 kg)    Body mass index is 14.54 kg/m.  Performance status (ECOG): 1  PHYSICAL EXAM:  Physical Exam Constitutional:      General: She is not in acute distress.    Appearance: Normal appearance.     Comments: She remains very thin/gaunt with chronically ill appearance.  She is in a wheelchair.  HENT:     Head: Normocephalic and atraumatic.  Eyes:     General: No scleral icterus.    Extraocular Movements: Extraocular movements intact.     Conjunctiva/sclera: Conjunctivae normal.     Pupils: Pupils are equal, round, and reactive to light.  Cardiovascular:     Rate and Rhythm: Regular rhythm. Tachycardia present.     Pulses: Normal pulses.     Heart sounds: Normal heart sounds. No murmur heard.   No  friction rub. No gallop.  Pulmonary:     Effort: Pulmonary effort is normal. No respiratory distress.     Breath sounds: Normal breath sounds.  Abdominal:     General: Bowel sounds are normal. There is no distension.     Palpations: Abdomen is soft. There is no hepatomegaly, splenomegaly or mass.     Tenderness: There is no abdominal tenderness.  Musculoskeletal:        General: Normal range of motion.     Cervical back: Normal range of motion and neck supple.     Right lower leg: No edema.     Left lower leg: No edema.  Lymphadenopathy:     Cervical: No cervical adenopathy.  Skin:    General: Skin is warm and dry.  Neurological:     General: No focal deficit present.     Mental Status: She is alert and oriented to person, place, and time. Mental status is at baseline.  Psychiatric:        Mood and Affect: Mood normal.        Behavior: Behavior normal.        Thought Content: Thought content normal.        Judgment: Judgment normal.   PATHOLOGY:      ASSESSMENT & PLAN:  Assessment/Plan:  A 61 y.o. female with metastatic hormone positive breast cancer.  In clinic today, I went over her paracentesis results with her, for which she understands that she has malignant ascites.  This is ultimately consistent with disease progression while on elacestrant.  However, due to her failing health, there really is not any other therapy that I would use for her disease management.  She is clearly too weak for palliative chemotherapy to be considered.  As the patient is tolerating her elacestrant treatment very well, this will continue to be given.  However, she understands that her disease is progressing and that her life expectancy is very limited.  Although she and her husband were not particularly interested in hearing this information, it was told to them.  I will tentatively see her back in 1 month for repeat clinical assessment.  I encouraged them to contact our office before then if she runs  into any problems to where additional help at home is necessary.  The patient understands all the plans discussed today and is in agreement with them.     Barbara Leaf Macarthur Critchley, MD

## 2021-11-10 ENCOUNTER — Other Ambulatory Visit: Payer: Self-pay | Admitting: Oncology

## 2021-11-10 ENCOUNTER — Inpatient Hospital Stay (INDEPENDENT_AMBULATORY_CARE_PROVIDER_SITE_OTHER): Payer: PPO | Admitting: Oncology

## 2021-11-10 VITALS — BP 85/61 | HR 122 | Temp 97.8°F | Resp 14 | Ht 65.5 in | Wt 88.7 lb

## 2021-11-10 DIAGNOSIS — C50111 Malignant neoplasm of central portion of right female breast: Secondary | ICD-10-CM | POA: Diagnosis not present

## 2021-11-10 DIAGNOSIS — C773 Secondary and unspecified malignant neoplasm of axilla and upper limb lymph nodes: Secondary | ICD-10-CM | POA: Diagnosis not present

## 2021-11-10 DIAGNOSIS — Z170 Estrogen receptor positive status [ER+]: Secondary | ICD-10-CM | POA: Diagnosis not present

## 2021-11-10 DIAGNOSIS — C50511 Malignant neoplasm of lower-outer quadrant of right female breast: Secondary | ICD-10-CM | POA: Diagnosis not present

## 2021-11-12 ENCOUNTER — Encounter: Payer: Self-pay | Admitting: Oncology

## 2021-11-15 ENCOUNTER — Encounter: Payer: Self-pay | Admitting: Oncology

## 2021-12-01 ENCOUNTER — Other Ambulatory Visit: Payer: Self-pay | Admitting: Hematology and Oncology

## 2021-12-01 ENCOUNTER — Telehealth: Payer: Self-pay

## 2021-12-01 DIAGNOSIS — E876 Hypokalemia: Secondary | ICD-10-CM

## 2021-12-01 NOTE — Telephone Encounter (Signed)
Barbara Mayo called from the funeral home '@[phone]'$ [phone]. Dr Bobby Rumpf aware he needs to sign the death certificate on line. Lewann contacted and made aware this would more than likey be done tommorow.

## 2021-12-02 ENCOUNTER — Telehealth: Payer: Self-pay

## 2021-12-02 NOTE — Telephone Encounter (Signed)
Funeral home made aware death certificate has been signed. [phone]

## 2021-12-08 ENCOUNTER — Ambulatory Visit: Payer: PPO | Admitting: Oncology

## 2021-12-08 ENCOUNTER — Other Ambulatory Visit: Payer: PPO

## 2021-12-19 DEATH — deceased

## 2022-04-30 NOTE — Telephone Encounter (Signed)
No additional notes

## 2022-05-11 IMAGING — CT NM WHOLE BODY PET/CT
1 of 7 series · 4 of 25 positions shown · non-contrast
Comparison: On PET-CT scan 11/15/2019, CT scan 10/07/2019
COMPARISON: On PET-CT scan 11/15/2019, CT scan 10/07/2019

Addendum:
CLINICAL DATA: Recent gastric biopsy positive for breast cancer
metastasis. History of lobular breast carcinoma.

EXAM:
NUCLEAR MEDICINE PET WHOLE BODY
TECHNIQUE: 5.7 mCi F-18 Estradiol was injected intravenously. Full-ring PET
imaging was performed from the vertex to feet after the radiotracer.
CT data was obtained and used for attenuation correction and
anatomic localization.
Patient was taken off FLUVESTRANT 11/02/2019

[Series 4: ct wb 5.0 hd_fov · axial · 5.0mm · 1.33mm/px · z∈[+28,+1072]mm · 4 of 437 slices shown]
[im 88/437  soft-tissue]
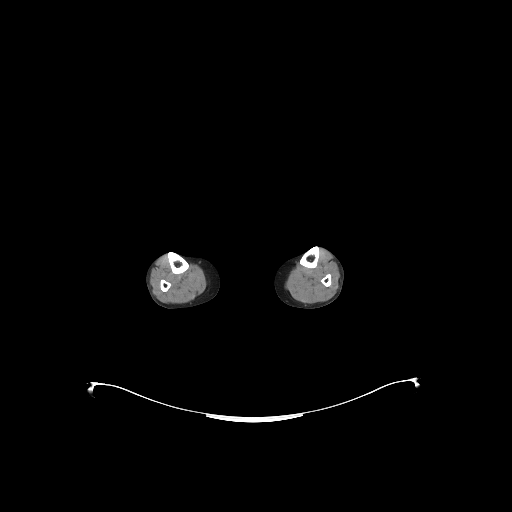
[im 175/437  soft-tissue]
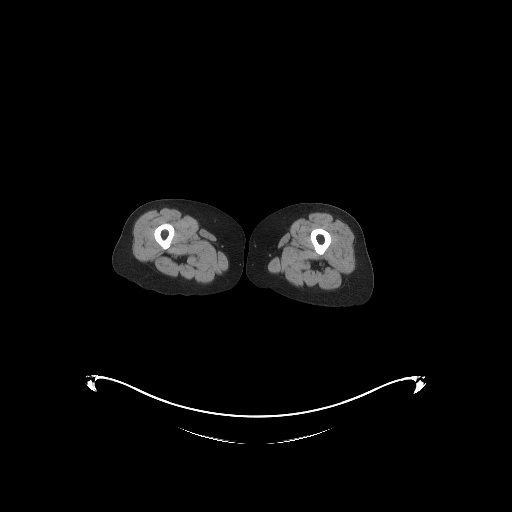
[im 262/437  soft-tissue]
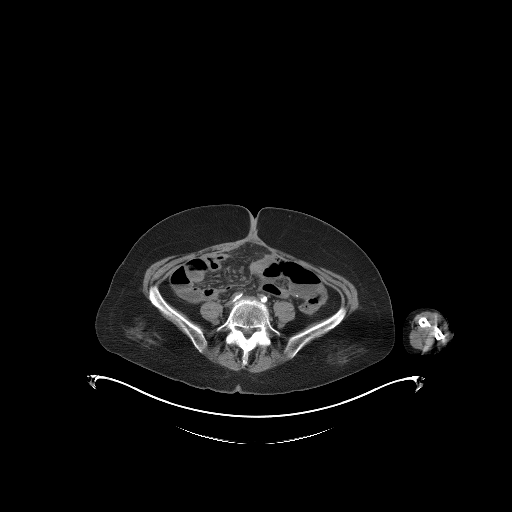
[im 349/437  soft-tissue]
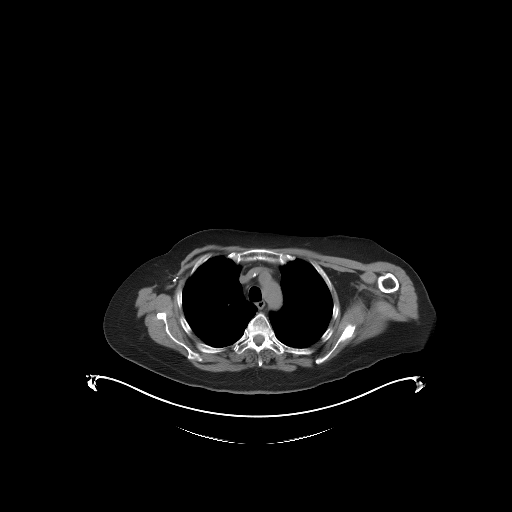

[4 of 25 positions shown; findings below may reference images not displayed]

FINDINGS: Mediastinal blood pool activity:

HEAD/NECK: No abnormal radiotracer within the brain parenchyma.
Physiologic activity noted in the LEFT venous sinus.

Incidental CT findings: None

CHEST: No abnormal radiotracer activity within breast tissue. No
abnormal activity in lymph nodes in the mediastinum or axilla.

Incidental CT findings: No suspicious pulmonary nodules. Branching
nodule in the RIGHT upper lobe (image [DATE]) is most suggestive of
small focus inflammation or infection.

ABDOMEN/PELVIS: Close inspection of the stomach demonstrates no
focal radiotracer activity to suggest estrogen positive breast
cancer metastasis. No evidence of metastatic adenopathy in the
abdomen pelvis.

Intense physiologic activity within liver limits evaluation of the
liver for metastatic disease.

Incidental CT findings: Again demonstrate dilated loops small bowel
related to prior small bowel surgery. These are not changed from
comparison PET-CT or CT.

There is diffuse low-attenuation within liver consistent hepatic
steatosis.

SKELETON: No evidence  estrogen positive skeletal metastasis.

Incidental CT findings: No lytic or sclerotic lesions identified.

EXTREMITIES: No evidence of metastatic disease

Incidental CT findings: No fracture
IMPRESSION: 1. No evidence of estrogen positive breast cancer metastasis on
whole-body F 18 estradiol PET scan. Particular attention directed to
the stomach where positive biopsies have been obtained.
2. Dilated small bowel in the pelvis are similar comparison exam and
related to prior small bowel surgeries.
3. Hepatic steatosis noted.
4. No CT evidence of skeletal metastasis.

ADDENDUM:
Patient had stopped taking FLUVESTRANT on 11/02/2019 (4 weeks prior
to the current scan). However, per package insert, recommend 28
weeks cessation of FLUVESTRANT prior to F 18 estradiol estradiol
scan to avoid receptor interference. This drug could contribute to
false negative findings.

*** End of Addendum ***
FINDINGS: Mediastinal blood pool activity:

HEAD/NECK: No abnormal radiotracer within the brain parenchyma.
Physiologic activity noted in the LEFT venous sinus.

Incidental CT findings: None

CHEST: No abnormal radiotracer activity within breast tissue. No
abnormal activity in lymph nodes in the mediastinum or axilla.

Incidental CT findings: No suspicious pulmonary nodules. Branching
nodule in the RIGHT upper lobe (image [DATE]) is most suggestive of
small focus inflammation or infection.

ABDOMEN/PELVIS: Close inspection of the stomach demonstrates no
focal radiotracer activity to suggest estrogen positive breast
cancer metastasis. No evidence of metastatic adenopathy in the
abdomen pelvis.

Intense physiologic activity within liver limits evaluation of the
liver for metastatic disease.

Incidental CT findings: Again demonstrate dilated loops small bowel
related to prior small bowel surgery. These are not changed from
comparison PET-CT or CT.

There is diffuse low-attenuation within liver consistent hepatic
steatosis.

SKELETON: No evidence  estrogen positive skeletal metastasis.

Incidental CT findings: No lytic or sclerotic lesions identified.

EXTREMITIES: No evidence of metastatic disease

Incidental CT findings: No fracture
IMPRESSION: 1. No evidence of estrogen positive breast cancer metastasis on
whole-body F 18 estradiol PET scan. Particular attention directed to
the stomach where positive biopsies have been obtained.
2. Dilated small bowel in the pelvis are similar comparison exam and
related to prior small bowel surgeries.
3. Hepatic steatosis noted.
4. No CT evidence of skeletal metastasis.
